# Patient Record
Sex: Male | Born: 1940 | Race: Black or African American | Hispanic: No | Marital: Married | Smoking: Former smoker
Health system: Southern US, Community
[De-identification: ages and names within clinical notes are randomized; demographics above are authoritative.]

## PROBLEM LIST (undated history)

## (undated) DIAGNOSIS — M1990 Unspecified osteoarthritis, unspecified site: Secondary | ICD-10-CM

## (undated) DIAGNOSIS — C801 Malignant (primary) neoplasm, unspecified: Secondary | ICD-10-CM

## (undated) DIAGNOSIS — E785 Hyperlipidemia, unspecified: Secondary | ICD-10-CM

## (undated) DIAGNOSIS — I10 Essential (primary) hypertension: Secondary | ICD-10-CM

## (undated) DIAGNOSIS — I639 Cerebral infarction, unspecified: Secondary | ICD-10-CM

## (undated) DIAGNOSIS — N183 Chronic kidney disease, stage 3 unspecified: Secondary | ICD-10-CM

## (undated) DIAGNOSIS — E119 Type 2 diabetes mellitus without complications: Secondary | ICD-10-CM

## (undated) DIAGNOSIS — K5790 Diverticulosis of intestine, part unspecified, without perforation or abscess without bleeding: Secondary | ICD-10-CM

## (undated) DIAGNOSIS — I429 Cardiomyopathy, unspecified: Secondary | ICD-10-CM

## (undated) DIAGNOSIS — N200 Calculus of kidney: Secondary | ICD-10-CM

## (undated) HISTORY — DX: Unspecified osteoarthritis, unspecified site: M19.90

## (undated) HISTORY — DX: Chronic kidney disease, stage 3 unspecified: N18.30

## (undated) HISTORY — DX: Malignant (primary) neoplasm, unspecified: C80.1

## (undated) HISTORY — DX: Chronic kidney disease, stage 3 (moderate): N18.3

## (undated) HISTORY — DX: Cardiomyopathy, unspecified: I42.9

## (undated) HISTORY — DX: Diverticulosis of intestine, part unspecified, without perforation or abscess without bleeding: K57.90

## (undated) HISTORY — PX: COLONOSCOPY: SHX174

## (undated) HISTORY — DX: Type 2 diabetes mellitus without complications: E11.9

## (undated) HISTORY — DX: Calculus of kidney: N20.0

## (undated) HISTORY — DX: Cerebral infarction, unspecified: I63.9

---

## 1989-07-04 DIAGNOSIS — C801 Malignant (primary) neoplasm, unspecified: Secondary | ICD-10-CM

## 1989-07-04 HISTORY — DX: Malignant (primary) neoplasm, unspecified: C80.1

## 2004-04-16 ENCOUNTER — Ambulatory Visit: Payer: Self-pay | Admitting: Internal Medicine

## 2005-02-04 ENCOUNTER — Ambulatory Visit: Payer: Self-pay | Admitting: Internal Medicine

## 2005-02-07 ENCOUNTER — Ambulatory Visit: Payer: Self-pay | Admitting: Specialist

## 2005-02-09 ENCOUNTER — Ambulatory Visit: Payer: Self-pay | Admitting: Specialist

## 2005-12-16 ENCOUNTER — Ambulatory Visit: Payer: Self-pay | Admitting: Family Medicine

## 2006-07-04 DIAGNOSIS — E119 Type 2 diabetes mellitus without complications: Secondary | ICD-10-CM

## 2006-07-04 DIAGNOSIS — M1990 Unspecified osteoarthritis, unspecified site: Secondary | ICD-10-CM

## 2006-07-04 HISTORY — DX: Type 2 diabetes mellitus without complications: E11.9

## 2006-07-04 HISTORY — DX: Unspecified osteoarthritis, unspecified site: M19.90

## 2006-07-04 HISTORY — PX: COLON SURGERY: SHX602

## 2007-03-12 ENCOUNTER — Ambulatory Visit: Payer: Self-pay | Admitting: Internal Medicine

## 2007-03-22 ENCOUNTER — Ambulatory Visit: Payer: Self-pay | Admitting: General Surgery

## 2008-03-19 ENCOUNTER — Inpatient Hospital Stay: Payer: Self-pay | Admitting: Internal Medicine

## 2011-07-05 HISTORY — PX: FOOT SURGERY: SHX648

## 2012-02-13 ENCOUNTER — Inpatient Hospital Stay: Payer: Self-pay | Admitting: Internal Medicine

## 2012-02-13 LAB — DIFFERENTIAL
Bands: 6 %
Lymphocytes: 7 %
Monocytes: 5 %

## 2012-02-13 LAB — COMPREHENSIVE METABOLIC PANEL
Albumin: 3.1 g/dL — ABNORMAL LOW (ref 3.4–5.0)
Alkaline Phosphatase: 119 U/L (ref 50–136)
Anion Gap: 10 (ref 7–16)
BUN: 18 mg/dL (ref 7–18)
Co2: 25 mmol/L (ref 21–32)
EGFR (African American): 51 — ABNORMAL LOW
Glucose: 231 mg/dL — ABNORMAL HIGH (ref 65–99)
Osmolality: 268 (ref 275–301)
Potassium: 4 mmol/L (ref 3.5–5.1)
SGOT(AST): 63 U/L — ABNORMAL HIGH (ref 15–37)
Sodium: 129 mmol/L — ABNORMAL LOW (ref 136–145)
Total Protein: 7.7 g/dL (ref 6.4–8.2)

## 2012-02-13 LAB — CBC
HCT: 27.6 % — ABNORMAL LOW (ref 40.0–52.0)
HGB: 9.4 g/dL — ABNORMAL LOW (ref 13.0–18.0)
MCH: 31.6 pg (ref 26.0–34.0)
MCHC: 33.9 g/dL (ref 32.0–36.0)
MCV: 93 fL (ref 80–100)

## 2012-02-13 LAB — TROPONIN I
Troponin-I: 0.1 ng/mL — ABNORMAL HIGH
Troponin-I: 0.09 ng/mL — ABNORMAL HIGH

## 2012-02-13 LAB — CK TOTAL AND CKMB (NOT AT ARMC): CK-MB: 1.4 ng/mL (ref 0.5–3.6)

## 2012-02-14 LAB — LIPID PANEL
HDL Cholesterol: 13 mg/dL — ABNORMAL LOW (ref 40–60)
Ldl Cholesterol, Calc: 71 mg/dL (ref 0–100)
Triglycerides: 190 mg/dL (ref 0–200)
VLDL Cholesterol, Calc: 38 mg/dL (ref 5–40)

## 2012-02-14 LAB — CBC WITH DIFFERENTIAL/PLATELET
Basophil #: 0.2 10*3/uL — ABNORMAL HIGH (ref 0.0–0.1)
Basophil %: 0.9 %
Basophil %: 0.3 %
Eosinophil #: 0.2 10*3/uL (ref 0.0–0.7)
Eosinophil #: 0.3 10*3/uL (ref 0.0–0.7)
Eosinophil %: 1.3 %
Eosinophil %: 1.3 %
HCT: 23.3 % — ABNORMAL LOW (ref 40.0–52.0)
HCT: 23.7 % — ABNORMAL LOW (ref 40.0–52.0)
HGB: 7.7 g/dL — ABNORMAL LOW (ref 13.0–18.0)
HGB: 7.9 g/dL — ABNORMAL LOW (ref 13.0–18.0)
Lymphocyte %: 5.9 %
Lymphocyte %: 6 %
MCH: 31.5 pg (ref 26.0–34.0)
MCHC: 32.9 g/dL (ref 32.0–36.0)
Monocyte #: 2 x10 3/mm — ABNORMAL HIGH (ref 0.2–1.0)
Monocyte #: 2.2 x10 3/mm — ABNORMAL HIGH (ref 0.2–1.0)
Monocyte %: 11.3 %
Neutrophil #: 15.9 10*3/uL — ABNORMAL HIGH (ref 1.4–6.5)
Neutrophil %: 81.4 %
Neutrophil %: 81.1 %
Platelet: 207 10*3/uL (ref 150–440)
RBC: 2.49 10*6/uL — ABNORMAL LOW (ref 4.40–5.90)
RBC: 2.52 10*6/uL — ABNORMAL LOW (ref 4.40–5.90)
WBC: 19.3 10*3/uL — ABNORMAL HIGH (ref 3.8–10.6)

## 2012-02-14 LAB — COMPREHENSIVE METABOLIC PANEL
Albumin: 2.5 g/dL — ABNORMAL LOW (ref 3.4–5.0)
Alkaline Phosphatase: 94 U/L (ref 50–136)
BUN: 19 mg/dL — ABNORMAL HIGH (ref 7–18)
Bilirubin,Total: 0.9 mg/dL (ref 0.2–1.0)
Co2: 25 mmol/L (ref 21–32)
Creatinine: 1.79 mg/dL — ABNORMAL HIGH (ref 0.60–1.30)
EGFR (African American): 44 — ABNORMAL LOW
EGFR (Non-African Amer.): 38 — ABNORMAL LOW
Glucose: 57 mg/dL — ABNORMAL LOW (ref 65–99)
Potassium: 3.4 mmol/L — ABNORMAL LOW (ref 3.5–5.1)
SGPT (ALT): 73 U/L (ref 12–78)

## 2012-02-14 LAB — URINALYSIS, COMPLETE
Ketone: NEGATIVE
Ph: 5 (ref 4.5–8.0)
Protein: 100
RBC,UR: 256 /HPF (ref 0–5)
Transitional Epi: <1

## 2012-02-14 LAB — HEMOGLOBIN A1C: Hemoglobin A1C: 6.8 % — ABNORMAL HIGH (ref 4.2–6.3)

## 2012-02-14 LAB — CK TOTAL AND CKMB (NOT AT ARMC): CK, Total: 94 U/L (ref 35–232)

## 2012-02-14 LAB — MAGNESIUM: Magnesium: 1.7 mg/dL — ABNORMAL LOW

## 2012-02-15 LAB — CBC WITH DIFFERENTIAL/PLATELET
Basophil #: 0.1 10*3/uL (ref 0.0–0.1)
Basophil %: 0.8 %
Eosinophil #: 0.3 10*3/uL (ref 0.0–0.7)
HCT: 22.9 % — ABNORMAL LOW (ref 40.0–52.0)
HGB: 7.7 g/dL — ABNORMAL LOW (ref 13.0–18.0)
Lymphocyte #: 1.7 10*3/uL (ref 1.0–3.6)
Lymphocyte %: 10.2 %
MCH: 31.8 pg (ref 26.0–34.0)
MCHC: 33.7 g/dL (ref 32.0–36.0)
MCV: 94 fL (ref 80–100)
Monocyte #: 2 x10 3/mm — ABNORMAL HIGH (ref 0.2–1.0)
Neutrophil #: 12.2 10*3/uL — ABNORMAL HIGH (ref 1.4–6.5)
Neutrophil %: 74.9 %
RDW: 13.8 % (ref 11.5–14.5)

## 2012-02-15 LAB — BASIC METABOLIC PANEL
BUN: 26 mg/dL — ABNORMAL HIGH (ref 7–18)
Calcium, Total: 7.9 mg/dL — ABNORMAL LOW (ref 8.5–10.1)
EGFR (African American): 44 — ABNORMAL LOW
EGFR (Non-African Amer.): 38 — ABNORMAL LOW
Glucose: 59 mg/dL — ABNORMAL LOW (ref 65–99)
Potassium: 4.4 mmol/L (ref 3.5–5.1)
Sodium: 134 mmol/L — ABNORMAL LOW (ref 136–145)

## 2012-02-16 LAB — URINE CULTURE

## 2012-02-16 LAB — HEPATIC FUNCTION PANEL A (ARMC)
Albumin: 2.5 g/dL — ABNORMAL LOW (ref 3.4–5.0)
Alkaline Phosphatase: 104 U/L (ref 50–136)
Bilirubin,Total: 0.9 mg/dL (ref 0.2–1.0)
SGOT(AST): 37 U/L (ref 15–37)

## 2012-02-16 LAB — HEMOGLOBIN: HGB: 8.8 g/dL — ABNORMAL LOW (ref 13.0–18.0)

## 2012-02-16 LAB — BASIC METABOLIC PANEL
Anion Gap: 9 (ref 7–16)
Calcium, Total: 8.4 mg/dL — ABNORMAL LOW (ref 8.5–10.1)
Chloride: 97 mmol/L — ABNORMAL LOW (ref 98–107)
Glucose: 207 mg/dL — ABNORMAL HIGH (ref 65–99)
Osmolality: 273 (ref 275–301)

## 2012-02-17 LAB — CBC WITH DIFFERENTIAL/PLATELET
Basophil #: 0.1 10*3/uL (ref 0.0–0.1)
Basophil %: 0.5 %
Eosinophil #: 0.3 10*3/uL (ref 0.0–0.7)
HCT: 23.3 % — ABNORMAL LOW (ref 40.0–52.0)
HGB: 7.4 g/dL — ABNORMAL LOW (ref 13.0–18.0)
Lymphocyte %: 9.6 %
MCHC: 31.8 g/dL — ABNORMAL LOW (ref 32.0–36.0)
Monocyte %: 7.4 %
Neutrophil #: 13.8 10*3/uL — ABNORMAL HIGH (ref 1.4–6.5)
Neutrophil %: 80.5 %
Platelet: 263 10*3/uL (ref 150–440)
RBC: 2.48 10*6/uL — ABNORMAL LOW (ref 4.40–5.90)
WBC: 17.2 10*3/uL — ABNORMAL HIGH (ref 3.8–10.6)

## 2012-02-17 LAB — BASIC METABOLIC PANEL
Anion Gap: 8 (ref 7–16)
BUN: 23 mg/dL — ABNORMAL HIGH (ref 7–18)
Chloride: 97 mmol/L — ABNORMAL LOW (ref 98–107)
Creatinine: 1.72 mg/dL — ABNORMAL HIGH (ref 0.60–1.30)
EGFR (African American): 46 — ABNORMAL LOW
EGFR (Non-African Amer.): 39 — ABNORMAL LOW
Glucose: 182 mg/dL — ABNORMAL HIGH (ref 65–99)
Osmolality: 269 (ref 275–301)

## 2012-02-18 LAB — CBC WITH DIFFERENTIAL/PLATELET
Basophil #: 0.1 10*3/uL (ref 0.0–0.1)
Eosinophil #: 0.3 10*3/uL (ref 0.0–0.7)
Lymphocyte %: 10.6 %
MCHC: 32.3 g/dL (ref 32.0–36.0)
Neutrophil %: 79.8 %
Platelet: 285 10*3/uL (ref 150–440)
RDW: 13.9 % (ref 11.5–14.5)

## 2012-02-18 LAB — COMPREHENSIVE METABOLIC PANEL
Albumin: 2.4 g/dL — ABNORMAL LOW (ref 3.4–5.0)
Alkaline Phosphatase: 96 U/L (ref 50–136)
Calcium, Total: 8.3 mg/dL — ABNORMAL LOW (ref 8.5–10.1)
Chloride: 97 mmol/L — ABNORMAL LOW (ref 98–107)
Co2: 26 mmol/L (ref 21–32)
Glucose: 149 mg/dL — ABNORMAL HIGH (ref 65–99)
SGOT(AST): 22 U/L (ref 15–37)
SGPT (ALT): 40 U/L (ref 12–78)

## 2012-02-18 LAB — FERRITIN: Ferritin (ARMC): 644 ng/mL — ABNORMAL HIGH (ref 8–388)

## 2012-02-18 LAB — WOUND CULTURE

## 2012-02-18 LAB — IRON AND TIBC
Iron Saturation: 20 %
Iron: 36 ug/dL — ABNORMAL LOW (ref 65–175)
Unbound Iron-Bind.Cap.: 146 ug/dL

## 2012-02-18 LAB — FOLATE: Folic Acid: 18.5 ng/mL (ref 3.1–100.0)

## 2012-02-19 LAB — CBC WITH DIFFERENTIAL/PLATELET
Bands: 4 %
Lymphocytes: 15 %
MCV: 95 fL (ref 80–100)
Metamyelocyte: 2 %
Myelocyte: 1 %
NRBC/100 WBC: 1 /
Platelet: 283 10*3/uL (ref 150–440)
RBC: 2.74 10*6/uL — ABNORMAL LOW (ref 4.40–5.90)
RDW: 14.2 % (ref 11.5–14.5)
WBC: 13.7 10*3/uL — ABNORMAL HIGH (ref 3.8–10.6)

## 2012-02-19 LAB — CULTURE, BLOOD (SINGLE)

## 2012-02-19 LAB — BASIC METABOLIC PANEL
Anion Gap: 8 (ref 7–16)
BUN: 25 mg/dL — ABNORMAL HIGH (ref 7–18)
Calcium, Total: 8.4 mg/dL — ABNORMAL LOW (ref 8.5–10.1)
Chloride: 101 mmol/L (ref 98–107)
Co2: 27 mmol/L (ref 21–32)
Creatinine: 1.6 mg/dL — ABNORMAL HIGH (ref 0.60–1.30)
EGFR (African American): 50 — ABNORMAL LOW
EGFR (Non-African Amer.): 43 — ABNORMAL LOW
Glucose: 239 mg/dL — ABNORMAL HIGH (ref 65–99)
Sodium: 136 mmol/L (ref 136–145)

## 2012-02-20 LAB — CBC WITH DIFFERENTIAL/PLATELET
Bands: 3 %
Basophil #: 0.1 10*3/uL (ref 0.0–0.1)
Eosinophil #: 0.3 10*3/uL (ref 0.0–0.7)
Eosinophil: 3 %
HCT: 27.6 % — ABNORMAL LOW (ref 40.0–52.0)
HGB: 8.8 g/dL — ABNORMAL LOW (ref 13.0–18.0)
Lymphocyte %: 12.2 %
Lymphocytes: 9 %
MCV: 94 fL (ref 80–100)
Monocyte %: 8.5 %
Myelocyte: 2 %
Neutrophil %: 76 %
Platelet: 316 10*3/uL (ref 150–440)
RBC: 2.93 10*6/uL — ABNORMAL LOW (ref 4.40–5.90)
RDW: 14.4 % (ref 11.5–14.5)
Segmented Neutrophils: 74 %
Variant Lymphocyte - H1-Rlymph: 3 %
WBC: 13.3 10*3/uL — ABNORMAL HIGH (ref 3.8–10.6)

## 2012-02-20 LAB — BASIC METABOLIC PANEL
Anion Gap: 8 (ref 7–16)
BUN: 25 mg/dL — ABNORMAL HIGH (ref 7–18)
Co2: 31 mmol/L (ref 21–32)
Creatinine: 1.46 mg/dL — ABNORMAL HIGH (ref 0.60–1.30)
EGFR (African American): 56 — ABNORMAL LOW
EGFR (Non-African Amer.): 48 — ABNORMAL LOW
Glucose: 204 mg/dL — ABNORMAL HIGH (ref 65–99)
Potassium: 4 mmol/L (ref 3.5–5.1)
Sodium: 139 mmol/L (ref 136–145)

## 2012-09-26 ENCOUNTER — Encounter: Payer: Self-pay | Admitting: *Deleted

## 2012-10-01 ENCOUNTER — Ambulatory Visit (INDEPENDENT_AMBULATORY_CARE_PROVIDER_SITE_OTHER): Payer: Medicare Other | Admitting: General Surgery

## 2012-10-01 ENCOUNTER — Encounter: Payer: Self-pay | Admitting: General Surgery

## 2012-10-01 VITALS — BP 130/62 | HR 100 | Resp 18 | Ht 74.0 in | Wt 271.0 lb

## 2012-10-01 DIAGNOSIS — Z85038 Personal history of other malignant neoplasm of large intestine: Secondary | ICD-10-CM

## 2012-10-01 MED ORDER — POLYETHYLENE GLYCOL 3350 17 GM/SCOOP PO POWD: ORAL | Status: DC

## 2012-10-01 NOTE — Progress Notes (Signed)
Patient ID: Jacob Buck, male   DOB: 01/09/1941, 72 y.o.   MRN: [id number]  No chief complaint on file.   HPI Jacob Buck is a 72 y.o. male that presents for a follow up colonoscopy.Patient had a colonoscopy in 2008. 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 HPI  Past Medical History  Diagnosis Date  . Endocrine problem 2008  . Arthritis 2008  . Diabetes mellitus without complication 2008  . Cancer   . Bowel trouble 2008    Past Surgical History  Procedure Laterality Date  . Colonoscopy  2003  . Colon surgery  2008  . Foot surgery      No family history on file.  Social History History  Substance Use Topics  . Smoking status: Never Smoker   . Smokeless tobacco: Not on file  . Alcohol Use: No    Allergies not on file  No current outpatient prescriptions on file.   No current facility-administered medications for this visit.    Review of Systems Review of Systems  Constitutional: Negative.   Respiratory: Negative.   Cardiovascular: Negative.   Gastrointestinal: Negative.     There were no vitals taken for this visit.  Physical Exam Physical Exam  Constitutional: He appears well-developed and well-nourished.  Eyes: Conjunctivae are normal. No scleral icterus.  Neck: Normal range of motion. Neck supple. No mass and no thyromegaly present.  Cardiovascular: Normal rate, regular rhythm and normal heart sounds.   Pulmonary/Chest: Effort normal and breath sounds normal.  Abdominal: Soft. Bowel sounds are normal. There is no hepatosplenomegaly. There is no tenderness.    Lymphadenopathy:    He has no cervical adenopathy.    He has no axillary adenopathy.       Right: No inguinal and no supraclavicular adenopathy present.       Left: No inguinal and no supraclavicular adenopathy present.    Data Reviewed    Assessment    History of colon cancer      Plan    Surveillance colonoscopy        Jacob Buck 10/01/2012, 1:15 PM

## 2012-10-01 NOTE — Patient Instructions (Addendum)
Colonoscopy with possible biopsy/polypectomy prn: Information regarding the procedure, including its potential risks and complications (including but not limited to perforation of the bowel, which may require emergency surgery to repair, and bleeding) was verbally given to the patient. Educational information regarding lower instestinal endoscopy was given to the patient. Written instructions for how to complete the bowel prep using Miralax were provided. The importance of drinking ample fluids to avoid dehydration as a result of the prep emphasized.  Patient has been scheduled for a colonoscopy on 10-10-12 at [facility]. This patient has been asked to not make use of Levemir day of colonoscopy prep or the morning of colonoscopy. Please note: patient reports he has not been making use of Novolog since blood sugars have been running so good for the past few weeks. He is aware to not make use of Novolog the day of prep and procedure as well.

## 2012-10-02 ENCOUNTER — Encounter: Payer: Self-pay | Admitting: General Surgery

## 2012-10-03 ENCOUNTER — Ambulatory Visit: Payer: Self-pay | Admitting: General Surgery

## 2012-10-05 ENCOUNTER — Other Ambulatory Visit: Payer: Self-pay | Admitting: General Surgery

## 2012-10-05 DIAGNOSIS — Z85038 Personal history of other malignant neoplasm of large intestine: Secondary | ICD-10-CM

## 2012-10-10 ENCOUNTER — Ambulatory Visit: Payer: Self-pay | Admitting: General Surgery

## 2012-10-10 DIAGNOSIS — Z85038 Personal history of other malignant neoplasm of large intestine: Secondary | ICD-10-CM

## 2013-09-18 ENCOUNTER — Ambulatory Visit (INDEPENDENT_AMBULATORY_CARE_PROVIDER_SITE_OTHER): Payer: Medicare Other | Admitting: General Surgery

## 2013-09-18 ENCOUNTER — Other Ambulatory Visit: Payer: Self-pay | Admitting: General Surgery

## 2013-09-18 ENCOUNTER — Encounter: Payer: Self-pay | Admitting: General Surgery

## 2013-09-18 VITALS — BP 122/84 | HR 86 | Resp 12 | Ht 74.5 in | Wt 255.0 lb

## 2013-09-18 DIAGNOSIS — K625 Hemorrhage of anus and rectum: Secondary | ICD-10-CM

## 2013-09-18 DIAGNOSIS — Z85038 Personal history of other malignant neoplasm of large intestine: Secondary | ICD-10-CM

## 2013-09-18 LAB — CBC WITH DIFFERENTIAL/PLATELET
BASOS ABS: 0.1 10*3/uL (ref 0.0–0.1)
Basophil %: 0.8 %
EOS PCT: 1.1 %
Eosinophil #: 0.1 10*3/uL (ref 0.0–0.7)
HCT: 37.4 % — ABNORMAL LOW (ref 40.0–52.0)
HGB: 12 g/dL — ABNORMAL LOW (ref 13.0–18.0)
Lymphocyte #: 1.9 10*3/uL (ref 1.0–3.6)
Lymphocyte %: 21.1 %
MCH: 30 pg (ref 26.0–34.0)
MCHC: 32.1 g/dL (ref 32.0–36.0)
MCV: 94 fL (ref 80–100)
Monocyte #: 0.5 x10 3/mm (ref 0.2–1.0)
Monocyte %: 5.5 %
Neutrophil #: 6.4 10*3/uL (ref 1.4–6.5)
Neutrophil %: 71.5 %
Platelet: 167 10*3/uL (ref 150–440)
RBC: 4 10*6/uL — ABNORMAL LOW (ref 4.40–5.90)
RDW: 13.5 % (ref 11.5–14.5)
WBC: 8.9 10*3/uL (ref 3.8–10.6)

## 2013-09-18 LAB — BASIC METABOLIC PANEL
Anion Gap: 3 — ABNORMAL LOW (ref 7–16)
BUN: 24 mg/dL — ABNORMAL HIGH (ref 7–18)
CHLORIDE: 99 mmol/L (ref 98–107)
CO2: 29 mmol/L (ref 21–32)
Calcium, Total: 8.9 mg/dL (ref 8.5–10.1)
Creatinine: 1.67 mg/dL — ABNORMAL HIGH (ref 0.60–1.30)
GFR CALC AF AMER: 47 — AB
GFR CALC NON AF AMER: 40 — AB
Glucose: 393 mg/dL — ABNORMAL HIGH (ref 65–99)
Osmolality: 283 (ref 275–301)
Potassium: 4.6 mmol/L (ref 3.5–5.1)
Sodium: 131 mmol/L — ABNORMAL LOW (ref 136–145)

## 2013-09-18 LAB — POC HEMOCCULT BLD/STL (OFFICE/1-CARD/DIAGNOSTIC): FECAL OCCULT BLD: POSITIVE

## 2013-09-18 NOTE — Addendum Note (Signed)
Addended by: Carson Myrtle on: 09/18/2013 10:38 AM   Modules accepted: Orders

## 2013-09-18 NOTE — Patient Instructions (Addendum)
The patient is aware to call back for any questions or concerns.Low-Fiber Diet Fiber is found in fruits, vegetables, and grains. A low-fiber diet restricts fibrous foods that are not digested in the small intestine. A diet containing about 10 grams of fiber is considered low fiber.  PURPOSE  To prevent blockage of a partially obstructed or narrowed gastrointestinal tract.  To reduce fecal weight and volume.  To slow the movement of feces. WHEN IS THIS DIET USED?  It may be used during the acute phase of Crohn disease, ulcerative colitis, regional enteritis, or diverticulitis.  It may be used if your intestinal or esophageal tubes are narrowing (stenosis).  It may be used as a transitional diet following surgery, injury (trauma), or illness. CHOOSING FOODS Check labels, especially on foods from the starch list. Often times, dietary fiber content is listed on the nutrition facts panel. Please ask your Registered Dietitian if you have questions about specific foods that are related to your condition, especially if the food is not listed on this handout. Breads and Starches  Allowed: White, [place], and pita breads, plain rolls, buns, or sweet rolls, doughnuts, waffles, pancakes, bagels. Plain muffins, biscuits, matzoth. Soda, saltine, graham crackers. Pretzels, rusks, melba toast, zwieback. Cooked cereals: cornmeal, farina, or cream cereals. Dry cereals: refined corn, wheat, rice, and oat cereals (check label). Potatoes prepared any way without skins, refined macaroni, spaghetti, noodles, refined rice.  Avoid: Whole-wheat bread, rolls, and crackers. Multigrains, rye, bran seeds, nuts, or coconut. Cereals containing whole grains, multigrains, bran, coconut, nuts, raisins. Cooked or dry oatmeal. Coarse wheat cereals, granola. Cereals advertised as "high fiber." Potato skins. Whole-grain pasta, wild or brown rice. Popcorn. Vegetables  Allowed: Strained tomato and vegetable juices. Fresh lettuce,  cucumber, spinach. Well-cooked or canned: asparagus, bean sprouts, broccoli, cut green beans, cauliflower, pumpkin, beets, mushrooms, yellow squash, tomato, tomato sauce, zucchini, turnips.Keep servings limited to  cup.  Avoid: Fresh, cooked, or canned: artichokes, baked beans, beet greens, Brussels sprouts, corn, kale, legumes, peas, sweet potatoes. Avoid large servings of any vegetables. Fruit  Allowed: All fruit juices except prune juice. Cooked or canned fruits without skin and seeds: apricots, applesauce, cantaloupe, cherries, grapefruit, grapes, kiwi, mandarin oranges, peaches, pears, fruit cocktail, pineapple, plums, watermelon. Fresh without skin: banana, grapes, cantaloupe, avocado, cherries, pineapple, kiwi, nectarines, peaches, blueberries. Keep servings limited to  cup or 1 piece.  Avoid: Fresh: apples with or without skin, apricots, mangoes, pears, raspberries, strawberries. Prune juice and juices with pulp, stewed or dried prunes. Dried fruits, raisins, dates. Avoid large servings of all fresh fruits. Meat and Protein Substitutes  Allowed: Ground or well-cooked tender beef, ham, veal, lamb, pork, poultry. Eggs, plain cheese. Fish, oysters, shrimp, lobster, other seafood. Liver, organ meats. Smooth nut butters.  Avoid: Tough, fibrous meats with gristle. Chunky nut butter.Cheese with seeds, nuts, or other foods not allowed. Nuts, seeds, legumes, dried peas, beans, lentils. Dairy  Allowed: All milk products except those not allowed.  Avoid: Yogurt or cheese that contains nuts, seeds, or added fruit. Soups and Combination Foods  Allowed: Bouillon, broth, or cream soups made from allowed foods. Any strained soup. Casseroles or mixed dishes made with allowed foods.  Avoid: Soups made from vegetables that are not allowed or that contain other foods not allowed. Desserts and Sweets  Allowed:Plain cakes and cookies, pie made with allowed fruit, pudding, custard, cream pie.  Gelatin, fruit, ice, sherbet, frozen ice pops. Ice cream, ice milk without nuts. Plain hard candy, honey, jelly, molasses, syrup, sugar, chocolate  syrup, gumdrops, marshmallows.  Avoid: Desserts, cookies, or candies that contain nuts, peanut butter, dried fruits. Jams, preserves with seeds, marmalade. Fats and Oils  Allowed:Margarine, butter, cream, mayonnaise, salad oils, plain salad dressings made from allowed foods.  Avoid: Seeds, nuts, olives. Beverages  Allowed: All, except those listed to avoid.  Avoid: Fruit juices with high pulp, prune juice. Condiments  Allowed:Ketchup, mustard, horseradish, vinegar, cream sauce, cheese sauce, cocoa powder. Spices in moderation: allspice, basil, bay leaves, celery powder or leaves, cinnamon, cumin powder, curry powder, ginger, mace, marjoram, onion or garlic powder, oregano, paprika, parsley flakes, ground pepper, rosemary, sage, savory, tarragon, thyme, turmeric.  Avoid: Coconut, pickles. SAMPLE MENU Breakfast   cup orange juice.  1 boiled egg.  1 slice white toast.  Margarine.   cup cornflakes.  1 cup milk.  Beverage. Lunch   cup chicken noodle soup.  2 to 3 oz sliced roast beef.  2 slices white bread.  Mayonnaise.   cup tomato juice.  1 small banana.  Beverage. Dinner  3 oz baked chicken.   cup scalloped potatoes.   cup cooked beets.  White dinner roll.  Margarine.   cup canned peaches.  Beverage. Document Released: 12/10/2001 Document Revised: 02/20/2013 Document Reviewed: 07/07/2011 [facility] Patient Information 2014 [place].

## 2013-09-18 NOTE — Progress Notes (Signed)
Patient ID: Jacob Buck, male   DOB: 02-11-1941, 73 y.o.   MRN: [id number]  Chief Complaint  Patient presents with  . Follow-up    blood in stool    HPI Jacob Buck is a 73 y.o. male here today for rectal bleeding. Patient state he is been having bright red blood in his stools since this Sunday with BM.  Bowels move every other day. Colonoscopy 10-2012. Denies abdominal pain. States he has been eating a lot of peanuts lately. This happen back in the 1970"s and the doctor said a "vessel in stomach had busted".  HPI  Past Medical History  Diagnosis Date  . Endocrine problem 2008  . Arthritis 2008  . Diabetes mellitus without complication 0932  . Bowel trouble 2008  . Diverticulosis   . 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 Colonoscopy  2003, 2014  . Foot surgery Right 2013  . Colon surgery  2008    carcinoid    History reviewed. No pertinent family history.  Social History History  Substance Use Topics  . Smoking status: Never Smoker   . Smokeless tobacco: Never Used  . Alcohol Use: No    No Known Allergies  Current Outpatient Prescriptions  Medication Sig Dispense Refill  . carvedilol (COREG) 12.5 MG tablet Take 1 tablet by mouth 2 (two) times daily with a meal.       . Furosemide (LASIX PO) Take by mouth as needed.      Marland Kitchen LEVEMIR FLEXPEN 100 UNIT/ML injection Inject into the skin 2 (two) times daily. 30am 25 pm      . lisinopril (PRINIVIL,ZESTRIL) 40 MG tablet       . NOVOFINE 32G X 6 MM MISC       . NOVOLOG FLEXPEN 100 UNIT/ML injection       . tamsulosin (FLOMAX) 0.4 MG CAPS Take 0.4 mg by mouth daily.       No current facility-administered medications for this visit.    Review of Systems Review of Systems  Constitutional: Negative.   Respiratory: Negative.   Cardiovascular: Negative.   Gastrointestinal: Positive for blood in stool. Negative for nausea, abdominal pain, diarrhea and constipation.    Blood  pressure 122/84, pulse 86, resp. rate 12, height 6' 2.5" (1.892 m), weight 255 lb (115.667 kg).  Physical Exam Physical Exam  Constitutional: He is oriented to person, place, and time. He appears well-developed and well-nourished.  Abdominal: Soft. There is no hepatosplenomegaly. There is no tenderness. No hernia.  Genitourinary: Rectal exam shows no external hemorrhoid and no fissure. Guaiac positive stool.  Digital exam showed blood stained stool, no palpable mass  Neurological: He is alert and oriented to person, place, and time.  Skin: Skin is warm and dry.    Data Reviewed Colonoscopy from last yr-no anorectal finding. Has sigmoid diverticulosis  Assessment    Probable diverticular bleed.     Plan    Check CBC, Met b. Low residue diet. Pt has no hemodynamic changes. Will avoid admission to hospital for now.        Ferrell Flam G 09/18/2013, 10:05 AM

## 2013-09-19 ENCOUNTER — Encounter: Payer: Self-pay | Admitting: General Surgery

## 2013-09-24 ENCOUNTER — Encounter: Payer: Self-pay | Admitting: General Surgery

## 2013-09-25 ENCOUNTER — Encounter: Payer: Self-pay | Admitting: General Surgery

## 2013-09-25 NOTE — Progress Notes (Signed)
Patient did have labs drawn at [facility] (unable to have completed at [facility] lab). I have called Manus Gunning at [facility] lab and she will be faxing results. Appointment scheduled for follow up in the office on 10-02-13.

## 2013-10-02 ENCOUNTER — Encounter: Payer: Self-pay | Admitting: General Surgery

## 2013-10-02 ENCOUNTER — Ambulatory Visit (INDEPENDENT_AMBULATORY_CARE_PROVIDER_SITE_OTHER): Payer: Medicare Other | Admitting: General Surgery

## 2013-10-02 VITALS — BP 132/78 | HR 90 | Resp 16 | Ht 74.0 in | Wt 253.0 lb

## 2013-10-02 DIAGNOSIS — K625 Hemorrhage of anus and rectum: Secondary | ICD-10-CM

## 2013-10-02 NOTE — Progress Notes (Signed)
Patient ID: Jacob Buck, male   DOB: March 21, 1941, 73 y.o.   MRN: [id number]  Chief Complaint  Patient presents with  . Follow-up    rectal bleeding    HPI Jacob Buck is a 73 y.o. male here today for follow up rectal bleeding. Patient state he has not been having bright red blood in his stools with BM. Bowels move every day and sometimes twice a day. Using metamucil and has been doing good.  Colonoscopy 10-2012. Denies abdominal pain.  Labs were done 09-18-13 that showed his blood glucose was elevated and creatine was 1.6.  HPI  Past Medical History  Diagnosis Date  . Endocrine problem 2008  . Arthritis 2008  . Diabetes mellitus without complication 5643  . Bowel trouble 2008  . Diverticulosis   . 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 Colonoscopy  2003, 2014  . Foot surgery Right 2013  . Colon surgery  2008    carcinoid    History reviewed. No pertinent family history.  Social History History  Substance Use Topics  . Smoking status: Never Smoker   . Smokeless tobacco: Never Used  . Alcohol Use: No    No Known Allergies  Current Outpatient Prescriptions  Medication Sig Dispense Refill  . carvedilol (COREG) 12.5 MG tablet Take 1 tablet by mouth 2 (two) times daily with a meal.       . Furosemide (LASIX PO) Take by mouth as needed.      Marland Kitchen LEVEMIR FLEXPEN 100 UNIT/ML injection Inject into the skin 2 (two) times daily. 30am 25 pm      . lisinopril (PRINIVIL,ZESTRIL) 40 MG tablet       . NOVOFINE 32G X 6 MM MISC       . NOVOLOG FLEXPEN 100 UNIT/ML injection       . tamsulosin (FLOMAX) 0.4 MG CAPS Take 0.4 mg by mouth daily.       No current facility-administered medications for this visit.    Review of Systems Review of Systems  Constitutional: Negative.   Respiratory: Negative.   Cardiovascular: Negative.   Gastrointestinal: Negative for abdominal pain, constipation and blood in stool.    Blood pressure 132/78,  pulse 90, resp. rate 16, height 6\' 2"  (1.88 m), weight 253 lb (114.76 kg).  Physical Exam Physical Exam  Constitutional: He is oriented to person, place, and time. He appears well-developed and well-nourished.  Eyes: Conjunctivae are normal.  Neck: Neck supple.  Abdominal: Soft. Bowel sounds are normal. There is no tenderness.  Lymphadenopathy:    He has no cervical adenopathy.  Neurological: He is alert and oriented to person, place, and time.  Skin: Skin is warm and dry.    Data Reviewed  Labs were done 09-18-13 that showed his blood glucose was elevated at 398 and creatine was 1.6.   Assessment    Diverticular bleed likely that has resolved.      Plan    Call for any reoccurrence of problems.       Jacob Buck G 10/02/2013, 1:00 PM

## 2013-10-02 NOTE — Patient Instructions (Signed)
Patient advise don the importance of blood sugar control. He is not using insulin, unable to afford, advised to discuss at his up coming visit with Dr Jeananne Rama.

## 2014-02-18 LAB — LIPID PANEL
CHOLESTEROL: 260 mg/dL — AB (ref 0–200)
HDL: 27 mg/dL — AB (ref 35–70)
TRIGLYCERIDES: 999 mg/dL — AB (ref 40–160)

## 2014-02-18 LAB — BASIC METABOLIC PANEL
BUN: 37 mg/dL — AB (ref 4–21)
Creatinine: 1.5 mg/dL — AB (ref 0.6–1.3)
Glucose: 367 mg/dL
POTASSIUM: 4.6 mmol/L (ref 3.4–5.3)
SODIUM: 133 mmol/L — AB (ref 137–147)

## 2014-02-18 LAB — HEMOGLOBIN A1C: Hgb A1c MFr Bld: 10.8 % — AB (ref 4.0–6.0)

## 2014-05-22 LAB — BASIC METABOLIC PANEL
BUN: 18 mg/dL (ref 4–21)
Creatinine: 1.4 mg/dL — AB (ref 0.6–1.3)
Glucose: 333 mg/dL
Potassium: 5.1 mmol/L (ref 3.4–5.3)
Sodium: 138 mmol/L (ref 137–147)

## 2014-05-22 LAB — HEMOGLOBIN A1C: HEMOGLOBIN A1C: 11.8 % — AB (ref 4.0–6.0)

## 2014-05-28 ENCOUNTER — Ambulatory Visit: Payer: Self-pay | Admitting: Endocrinology

## 2014-06-05 ENCOUNTER — Encounter (INDEPENDENT_AMBULATORY_CARE_PROVIDER_SITE_OTHER): Payer: Self-pay

## 2014-06-05 ENCOUNTER — Encounter: Payer: Self-pay | Admitting: Endocrinology

## 2014-06-05 ENCOUNTER — Ambulatory Visit (INDEPENDENT_AMBULATORY_CARE_PROVIDER_SITE_OTHER): Payer: Medicare Other | Admitting: Endocrinology

## 2014-06-05 VITALS — BP 158/98 | HR 91 | Wt 252.0 lb

## 2014-06-05 DIAGNOSIS — Z89421 Acquired absence of other right toe(s): Secondary | ICD-10-CM | POA: Insufficient documentation

## 2014-06-05 DIAGNOSIS — E113599 Type 2 diabetes mellitus with proliferative diabetic retinopathy without macular edema, unspecified eye: Secondary | ICD-10-CM

## 2014-06-05 DIAGNOSIS — E118 Type 2 diabetes mellitus with unspecified complications: Secondary | ICD-10-CM | POA: Insufficient documentation

## 2014-06-05 DIAGNOSIS — E1165 Type 2 diabetes mellitus with hyperglycemia: Secondary | ICD-10-CM

## 2014-06-05 DIAGNOSIS — E11319 Type 2 diabetes mellitus with unspecified diabetic retinopathy without macular edema: Secondary | ICD-10-CM | POA: Insufficient documentation

## 2014-06-05 DIAGNOSIS — I10 Essential (primary) hypertension: Secondary | ICD-10-CM

## 2014-06-05 DIAGNOSIS — IMO0002 Reserved for concepts with insufficient information to code with codable children: Secondary | ICD-10-CM

## 2014-06-05 DIAGNOSIS — N183 Chronic kidney disease, stage 3 unspecified: Secondary | ICD-10-CM

## 2014-06-05 DIAGNOSIS — E1129 Type 2 diabetes mellitus with other diabetic kidney complication: Secondary | ICD-10-CM

## 2014-06-05 DIAGNOSIS — Z85038 Personal history of other malignant neoplasm of large intestine: Secondary | ICD-10-CM | POA: Insufficient documentation

## 2014-06-05 DIAGNOSIS — E11359 Type 2 diabetes mellitus with proliferative diabetic retinopathy without macular edema: Secondary | ICD-10-CM

## 2014-06-05 DIAGNOSIS — E782 Mixed hyperlipidemia: Secondary | ICD-10-CM

## 2014-06-05 DIAGNOSIS — E1140 Type 2 diabetes mellitus with diabetic neuropathy, unspecified: Secondary | ICD-10-CM | POA: Insufficient documentation

## 2014-06-05 DIAGNOSIS — E1142 Type 2 diabetes mellitus with diabetic polyneuropathy: Secondary | ICD-10-CM

## 2014-06-05 DIAGNOSIS — E1122 Type 2 diabetes mellitus with diabetic chronic kidney disease: Secondary | ICD-10-CM

## 2014-06-05 LAB — HEPATIC FUNCTION PANEL
ALBUMIN: 4 g/dL (ref 3.5–5.2)
ALT: 18 U/L (ref 0–53)
AST: 20 U/L (ref 0–37)
Alkaline Phosphatase: 54 U/L (ref 39–117)
BILIRUBIN DIRECT: 0.1 mg/dL (ref 0.0–0.3)
TOTAL PROTEIN: 7.1 g/dL (ref 6.0–8.3)
Total Bilirubin: 1 mg/dL (ref 0.2–1.2)

## 2014-06-05 LAB — LIPID PANEL
Cholesterol: 245 mg/dL — ABNORMAL HIGH (ref 0–200)
HDL: 27.8 mg/dL — ABNORMAL LOW (ref >39.00)
NonHDL: 217.2
Total CHOL/HDL Ratio: 9
VLDL: 106.2 mg/dL — ABNORMAL HIGH (ref 0.0–40.0)

## 2014-06-05 LAB — HM DIABETES FOOT EXAM: HM DIABETIC FOOT EXAM: ABNORMAL

## 2014-06-05 MED ORDER — INSULIN PEN NEEDLE 32G X 4 MM MISC: Status: DC

## 2014-06-05 MED ORDER — INSULIN ASPART 100 UNIT/ML FLEXPEN: 20.0000 [IU] | PEN_INJECTOR | Freq: Two times a day (BID) | SUBCUTANEOUS | Status: DC

## 2014-06-05 MED ORDER — INSULIN DETEMIR 100 UNIT/ML FLEXPEN: 40.0000 [IU] | PEN_INJECTOR | Freq: Every day | SUBCUTANEOUS | Status: DC

## 2014-06-05 NOTE — Assessment & Plan Note (Signed)
Encouraged him to follow back with opthalm, since it has been quite long since he last saw them.

## 2014-06-05 NOTE — Assessment & Plan Note (Addendum)
Obtain last set of labs done with PCP. Update urine MA at later visits.  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

## 2014-06-05 NOTE — Assessment & Plan Note (Addendum)
Discussed at length regarding need for better control. Obtain last set of labs done with PCP.   Discussed dietary changes and given handout as well to control carbohydrates and portion sizes.  Encouraged him to check sugars 3 x daily. Discussed various treatment options given his limited income. He is reluctant to try premixed insulin in vial form due to limited vision.   He wishes to go back to Levemir. Asked him to start at 40 units once daily. Stop Toujeo.   He will start Novolog again ( 30 day free coupon given) at 20 units twice daily with brunch and supper.   They will drop off his sugars in about 10 days, for further adjustments. They want to keep down the visit copays.   RTC 6 weeks

## 2014-06-05 NOTE — Assessment & Plan Note (Signed)
Symptoms tolerable. Discussed foot care- daily. If he is agreeable then will consider podiatry visits in the future for foot care.

## 2014-06-05 NOTE — Progress Notes (Signed)
Reason for visit-  Jacob Buck is a 73 y.o.-year-old male, referred by his PCP,  Golden Pop, MD for management of Type 2 diabetes, uncontrolled, with complications ( Stage 3 CKD, proliferative retinopathy,neuropathy). Associated diagnosis of cardiomyopathy, though the patient denies history of it. Here with daughter.   HPI- Patient has been diagnosed with diabetes in 84s. Recalls being initially on lifestyle modifications.  Tried  Metformin in the past, but states that he was taken off as it was ineffective.  he has been on insulin since ~2010.   Pt is currently on a regimen of:  - Levemir 30 units am and 25 units pm --> last Thursday switched to Toujeo 30 units daily--> not taking since 2 days - Novolog 25 units qac- BID (BF, supper)- hasn't taken it in over a year due to finances  Took Toujeo till Tuesday -had an episode of where he started mumbling, sweating, was "out of it", doesn't remember anything else from that episode, lasted 45 minutes, no facial asymmetry, FS 266, BP 103/64 at that time, no apparent chest pain. Dr Jeananne Rama called by family then and he asked to cut back HCTZ to half dose. Then after 45 min alertness returned. Didn't go to ER because he has hx of refusal to go to ER per his daughter. The patient and family believe that this episode was due to his new medications- Toujeo and perhaps HCTZ. He has been off most of his medications since past 2 days.   Last hemoglobin A1c was: Lab Results  Component Value Date   HGBA1C 10.8* 02/18/2014   Reports that recent labs were drawn with PCP, 05/2014- I don't have those for review.   Pt checks his sugars 1-2 a day . Uses ? Mail order glucometer ( getting new one today or tomorrow). By recall they are:  PREMEAL Breakfast Lunch Dinner Bedtime Overall  Glucose range: 140-260   200s   Mean/median:        POST-MEAL PC Breakfast PC Lunch PC Dinner  Glucose range:     Mean/median:       Hypoglycemia-  No lows  recently.  Previous hx of taking "too much insulin" per daughter.   Dietary habits- eats twice times daily- Brunch and supper. Southern style of cooking at home with bacon grease and such.Daughter reports that the patient eats often and doesn't control his portion sizes . Sprite about 1 weekly. Exercise-  Doesn't exercise due to neuropathy. Walks with cane, especially again after recent episode, where he is still feeling some weakness.  Weight -  Wt Readings from Last 3 Encounters:  06/05/14 252 lb (114.306 kg)  10/02/13 253 lb (114.76 kg)  09/18/13 255 lb (115.667 kg)    Diabetes Complications-  Nephropathy- Yes, Stage 3  CKD, last BUN/creatinine- GFR 54 Lab Results  Component Value Date   BUN 37* 02/18/2014   CREATININE 1.5* 02/18/2014   No recent urine MA on file.   Retinopathy- Yes, Last DEE was in 2009. Didn't go again because of finances . Has limited vision.  Neuropathy- has numbness and tingling in his feet. Known neuropathy. Prior hx toe amputation ( right, 5th) and DM ulcer ~2012 Associated history - No CAD . No prior stroke. Chart mentions CMP. No hypothyroidism. his last TSH was No results found for: TSH  Hyperlipidemia-  his last set of lipids were- Currently on dietary therapy. Tolerating well.  Recalls being on simvastatin and atorvastatin in the past. Unclear why that was taken off by  PCP. Doesn't recall any side effects from these medication.  Lab Results  Component Value Date   CHOL 260* 02/18/2014   HDL 27* 02/18/2014   TRIG 999* 02/18/2014    Blood Pressure/HTN- Patient's blood pressure is not well controlled today on current regimen that includes ACE-I ( Lisinopril).He has been off this medication and the HCTZ since earlier this week as above. Was previously tolerating ACE-I okay. Continues to take coreg. Denies any systemic symptoms today.   Pt has FH of DM in siblings.  I have reviewed the patient's past medical history, family and social history, surgical  history, medications and allergies.  Past Medical History  Diagnosis Date  . Endocrine problem 2008  . Arthritis 2008  . Diabetes mellitus without complication 2440  . Bowel trouble 2008  . Diverticulosis   . Cancer 1991    colon,kidney   Past Surgical History  Procedure Laterality Date  . Colonoscopy  2003, 2014  . Foot surgery Right 2013  . Colon surgery  2008    carcinoid   Family History  Problem Relation Age of Onset  . Diabetes Sister   . Diabetes Brother    History   Social History  . Marital Status: Married    Spouse Name: N/A    Number of Children: N/A  . Years of Education: N/A   Occupational History  . Not on file.   Social History Main Topics  . Smoking status: Never Smoker   . Smokeless tobacco: Never Used  . Alcohol Use: No  . Drug Use: No  . Sexual Activity: Not on file   Other Topics Concern  . Not on file   Social History Narrative   Current Outpatient Prescriptions on File Prior to Visit  Medication Sig Dispense Refill  . carvedilol (COREG) 12.5 MG tablet Take 1 tablet by mouth 2 (two) times daily with a meal.     . lisinopril (PRINIVIL,ZESTRIL) 40 MG tablet      No current facility-administered medications on file prior to visit.   No Known Allergies   Review of Systems: [x]  complains of  [  ] denies General:   [ x ] Recent weight change [  ] Fatigue  [  ] Loss of appetite Eyes: [ x ]  Vision Difficulty [  ]  Eye pain ENT: [  ]  Hearing difficulty [  ]  Difficulty Swallowing CVS: [  ] Chest pain [  ]  Palpitations/Irregular Heart beat [  ]  Shortness of breath lying flat [ x ] Swelling of legs Resp: [  ] Frequent Cough [  ] Shortness of Breath  [  ]  Wheezing GI: [  ] Heartburn  [  ] Nausea or Vomiting  [  ] Diarrhea [ x ] Constipation  [  ] Abdominal Pain GU: [  ]  Polyuria  [ x ]  nocturia Bones/joints:  [ x ]  Muscle aches  [x  ] Joint Pain  [  ] Bone pain Skin/Hair/Nails: [  ]  Rash  [  ] New stretch marks [ x ]  Itching [  ]  Hair loss [  ]  Excessive hair growth Reproduction: [x  ] Low sexual desire , [  ]  Women: Menstrual cycle problems [  ]  Women: Breast Discharge [ x ] Men: Difficulty with erections [  ]  Men: Enlarged Breasts CNS: [  ] Frequent Headaches [x  ] Blurry vision [  ] Tremors [  ]  Seizures [  ] Loss of consciousness [  ] Localized weakness Endocrine: [  ]  Excess thirst [  ]  Feeling excessively hot [  ]  Feeling excessively cold Heme: [  ]  Easy bruising [  ]  Enlarged glands or lumps in neck Allergy: [  ]  Food allergies [  ] Environmental allergies  PE: BP 158/98 mmHg  Pulse 91  Wt 252 lb (114.306 kg)  SpO2 97% Wt Readings from Last 3 Encounters:  06/05/14 252 lb (114.306 kg)  10/02/13 253 lb (114.76 kg)  09/18/13 255 lb (115.667 kg)   GENERAL: No acute distress, well developed HEENT:  Eye exam shows normal external appearance. Oral exam shows normal mucosa .  NECK:   Neck exam shows no lymphadenopathy. No Carotids bruits. Thyroid is not enlarged and no nodules felt.  no acanthosis nigricans LUNGS:         Chest is symmetrical. Lungs are clear to auscultation.Marland Kitchen   HEART:         Heart sounds:  S1 and S2 are normal. No murmurs or clicks heard. ABDOMEN:  No Distention present. Liver and spleen are not palpable. No other mass or tenderness present.  EXTREMITIES:     There is no edema. 1+ DP pulses  NEUROLOGICAL:     Grossly intact.            Diabetic foot exam done with shoes and socks removed: Loss of Normal Monofilament testing bilaterally, right >left ( patchy decrease). No deformities of toes.  Nails  dystrophic. Skin normal color. No open wounds. Hyperkeratotic lesion right medial sole. Old healed ulcer right great toe base. Dry skin. 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  MUSCULOSKELETAL:       There is no enlargement or gross deformity of the joints.  SKIN:       No rash  ASSESSMENT AND PLAN: Problem List Items Addressed This Visit      Cardiovascular and Mediastinum   Essential hypertension,  benign    BP elevated today. Denies systemic symptoms. Asked him to restart Lisinopril 40 mg daily for now , in addition to continuing Coreg. He can continue to hold off on HCTZ.  He will try to check his BP at local pharmacies.   Repeat BMP in 10 days to assess K, GFR.      Relevant Medications      hydrochlorothiazide (HYDRODIURIL) 12.5 MG tablet     Endocrine   Type 2 diabetes, uncontrolled, with renal manifestation - Primary    Discussed at length regarding need for better control. Obtain last set of labs done with PCP.   Discussed dietary changes and given handout as well to control carbohydrates and portion sizes.  Encouraged him to check sugars 3 x daily. Discussed various treatment options given his limited income. He is reluctant to try premixed insulin in vial form due to limited vision.   He wishes to go back to Levemir. Asked him to start at 40 units once daily. Stop Toujeo.   He will start Novolog again ( 30 day free coupon given) at 20 units twice daily with brunch and supper.   They will drop off his sugars in about 10 days, for further adjustments. They want to keep down the visit copays.   RTC 6 weeks     Relevant Medications      Insulin Detemir (LEVEMIR FLEXPEN) 100 UNIT/ML Pen      insulin aspart (NOVOLOG FLEXPEN) 100 UNIT/ML FlexPen  Insulin Pen Needle (BD PEN NEEDLE NANO U/F) 32G X 4 MM MISC   Other Relevant Orders      Hepatic function panel      Lipid panel      Basic metabolic panel     Nervous and Auditory   Diabetic neuropathy    Symptoms tolerable. Discussed foot care- daily. If he is agreeable then will consider podiatry visits in the future for foot care.     Relevant Medications      Insulin Detemir (LEVEMIR FLEXPEN) 100 UNIT/ML Pen      insulin aspart (NOVOLOG FLEXPEN) 100 UNIT/ML FlexPen     Genitourinary   CKD stage 3 due to type 2 diabetes mellitus    Obtain last set of labs done with PCP. Update urine MA at later visits.  Last labs  were c/w Stage 3 CKD.    Relevant Medications      Insulin Detemir (LEVEMIR FLEXPEN) 100 UNIT/ML Pen      insulin aspart (NOVOLOG FLEXPEN) 100 UNIT/ML FlexPen     Other   Diabetic retinopathy    Encouraged him to follow back with opthalm, since it has been quite long since he last saw them.     Relevant Medications      Insulin Detemir (LEVEMIR FLEXPEN) 100 UNIT/ML Pen      insulin aspart (NOVOLOG FLEXPEN) 100 UNIT/ML FlexPen   Status post amputation of lesser toe of right foot    Due to prior injury. Healed well.     Hypercholesterolemia with hypertriglyceridemia    Will check with PCP regarding whether the patient had prior intolerances to the statins. He is currently not on any therapy, and the TGs levels were very elevated on last check. Discussed dietary therapy. May need to restart statin therapy.   Obtain labs today for lipid, liver tests.     Relevant Medications      hydrochlorothiazide (HYDRODIURIL) 12.5 MG tablet          - Return to clinic in  6 weeks with sugar log/meter.  Gared Gillie [facility] 06/05/2014 4:46 PM

## 2014-06-05 NOTE — Assessment & Plan Note (Signed)
Due to prior injury. Healed well.

## 2014-06-05 NOTE — Assessment & Plan Note (Signed)
BP elevated today. Denies systemic symptoms. Asked him to restart Lisinopril 40 mg daily for now , in addition to continuing Coreg. He can continue to hold off on HCTZ.  He will try to check his BP at local pharmacies.   Repeat BMP in 10 days to assess K, GFR.

## 2014-06-05 NOTE — Progress Notes (Signed)
Pre visit review using our clinic review tool, if applicable. No additional management support is needed unless otherwise documented below in the visit note. 

## 2014-06-05 NOTE — Patient Instructions (Addendum)
Check sugars 3 times daily ( fasting and premeal readings at alternating times, or at bedtime).  Record them in a sugar log and bring log and meter to next appointment.   Stop Toujeo.  Start levemir at 40 units daily at bedtime.  Start Novolog at 20 units with breakfast and 20 units with supper.    Start lisinopril again at 40 mg daily. Check BP at local pharmacies.    Will check with Dr Jeananne Rama regarding prior reactions to cholesterol medications.   Please come back for a follow-up appointment in 6 weeks.  Drop off sugars in 10 days for further adjustments. Labs then to check K levels after starting lisinopril.

## 2014-06-05 NOTE — Assessment & Plan Note (Signed)
Will check with PCP regarding whether the patient had prior intolerances to the statins. He is currently not on any therapy, and the TGs levels were very elevated on last check. Discussed dietary therapy. May need to restart statin therapy.   Obtain labs today for lipid, liver tests.

## 2014-06-07 LAB — LDL CHOLESTEROL, DIRECT: LDL DIRECT: 108 mg/dL

## 2014-06-09 ENCOUNTER — Other Ambulatory Visit: Payer: Self-pay | Admitting: Endocrinology

## 2014-06-09 DIAGNOSIS — E782 Mixed hyperlipidemia: Secondary | ICD-10-CM

## 2014-06-09 MED ORDER — ATORVASTATIN CALCIUM 20 MG PO TABS: 20.0000 mg | ORAL_TABLET | Freq: Every day | ORAL | Status: DC

## 2014-06-16 ENCOUNTER — Other Ambulatory Visit (INDEPENDENT_AMBULATORY_CARE_PROVIDER_SITE_OTHER): Payer: Medicare Other

## 2014-06-16 DIAGNOSIS — E1165 Type 2 diabetes mellitus with hyperglycemia: Secondary | ICD-10-CM

## 2014-06-16 DIAGNOSIS — E1129 Type 2 diabetes mellitus with other diabetic kidney complication: Secondary | ICD-10-CM

## 2014-06-16 DIAGNOSIS — IMO0002 Reserved for concepts with insufficient information to code with codable children: Secondary | ICD-10-CM

## 2014-06-16 LAB — BASIC METABOLIC PANEL
BUN: 29 mg/dL — ABNORMAL HIGH (ref 6–23)
CHLORIDE: 103 meq/L (ref 96–112)
CO2: 26 mEq/L (ref 19–32)
Calcium: 9.2 mg/dL (ref 8.4–10.5)
Creatinine, Ser: 1.4 mg/dL (ref 0.4–1.5)
GFR: 66.56 mL/min (ref >60.00)
Glucose, Bld: 235 mg/dL — ABNORMAL HIGH (ref 70–99)
Potassium: 4.6 mEq/L (ref 3.5–5.1)
Sodium: 137 mEq/L (ref 135–145)

## 2014-06-17 ENCOUNTER — Telehealth: Payer: Self-pay

## 2014-06-17 ENCOUNTER — Encounter: Payer: Self-pay | Admitting: Endocrinology

## 2014-06-17 NOTE — Telephone Encounter (Signed)
His sugars are still elevated at most times on current insulin regimen.   Please could you ask him to increase Novolog to 22 units with BF and supper. ( currently at 20 units twice daily).   In another 3 days, if sugars are still >180, then increase levemir to 42 units daily at bedtime ( instead of the 40 units that he is taking right now).    Thanks- please see that he has an appt in [place] as well for follow up.

## 2014-06-17 NOTE — Telephone Encounter (Signed)
Blood Sugar Readings:                                  Breakfast            Dinner            Bedtime  12/3           263                      264                  214  12/4           192                      261                  214  12/5           163                     242                   177  12/6           141                     189                   201  12/7           198                      204  12/8           189       12/9            225                    205                  225  12/10         241                     236  12/11         230                    169  12/12         237                     192  12/13         235                      205

## 2014-06-17 NOTE — Telephone Encounter (Signed)
Spoke to patient, wife and daughter to notify them of Dr. Boyd Kerbs comments. All parties verbalized understanding.

## 2014-07-22 ENCOUNTER — Encounter: Payer: Self-pay | Admitting: Endocrinology

## 2014-07-22 ENCOUNTER — Other Ambulatory Visit: Payer: Self-pay | Admitting: Endocrinology

## 2014-07-22 ENCOUNTER — Ambulatory Visit (INDEPENDENT_AMBULATORY_CARE_PROVIDER_SITE_OTHER): Payer: Medicare Other | Admitting: Endocrinology

## 2014-07-22 VITALS — BP 150/86 | HR 95 | Resp 14 | Ht 74.5 in | Wt 253.0 lb

## 2014-07-22 DIAGNOSIS — I10 Essential (primary) hypertension: Secondary | ICD-10-CM

## 2014-07-22 DIAGNOSIS — E1165 Type 2 diabetes mellitus with hyperglycemia: Principal | ICD-10-CM

## 2014-07-22 DIAGNOSIS — E1129 Type 2 diabetes mellitus with other diabetic kidney complication: Secondary | ICD-10-CM

## 2014-07-22 DIAGNOSIS — N183 Chronic kidney disease, stage 3 (moderate): Secondary | ICD-10-CM

## 2014-07-22 DIAGNOSIS — E1122 Type 2 diabetes mellitus with diabetic chronic kidney disease: Secondary | ICD-10-CM

## 2014-07-22 DIAGNOSIS — IMO0002 Reserved for concepts with insufficient information to code with codable children: Secondary | ICD-10-CM

## 2014-07-22 DIAGNOSIS — E782 Mixed hyperlipidemia: Secondary | ICD-10-CM

## 2014-07-22 LAB — COMPREHENSIVE METABOLIC PANEL
ALT: 18 U/L (ref 0–53)
AST: 18 U/L (ref 0–37)
Albumin: 4.1 g/dL (ref 3.5–5.2)
Alkaline Phosphatase: 60 U/L (ref 39–117)
BILIRUBIN TOTAL: 0.6 mg/dL (ref 0.2–1.2)
BUN: 42 mg/dL — ABNORMAL HIGH (ref 6–23)
CHLORIDE: 100 meq/L (ref 96–112)
CO2: 27 mEq/L (ref 19–32)
CREATININE: 1.7 mg/dL — AB (ref 0.40–1.50)
Calcium: 9.3 mg/dL (ref 8.4–10.5)
GFR: 51 mL/min — AB (ref >60.00)
Glucose, Bld: 293 mg/dL — ABNORMAL HIGH (ref 70–99)
POTASSIUM: 4.7 meq/L (ref 3.5–5.1)
SODIUM: 135 meq/L (ref 135–145)
Total Protein: 7.1 g/dL (ref 6.0–8.3)

## 2014-07-22 LAB — LIPID PANEL
Cholesterol: 155 mg/dL (ref 0–200)
HDL: 31.5 mg/dL — ABNORMAL LOW (ref >39.00)
TRIGLYCERIDES: 538 mg/dL — AB (ref 0.0–149.0)
Total CHOL/HDL Ratio: 5

## 2014-07-22 LAB — MICROALBUMIN / CREATININE URINE RATIO
Creatinine,U: 184.3 mg/dL
MICROALB/CREAT RATIO: 78.7 mg/g — AB (ref 0.0–30.0)
Microalb, Ur: 145 mg/dL — ABNORMAL HIGH (ref 0.0–1.9)

## 2014-07-22 LAB — LDL CHOLESTEROL, DIRECT: Direct LDL: 41 mg/dL

## 2014-07-22 NOTE — Assessment & Plan Note (Signed)
Was not able to find out why the patient was taken off statin earlier. Restarted statin at last visit. Tolerating well.  Update fasting labs today to assess response.  Continue with dietary therapy.

## 2014-07-22 NOTE — Assessment & Plan Note (Signed)
Last labs were c/w Stage 3 CKD and stable.

## 2014-07-22 NOTE — Progress Notes (Signed)
Pre visit review using our clinic review tool, if applicable. No additional management support is needed unless otherwise documented below in the visit note. 

## 2014-07-22 NOTE — Assessment & Plan Note (Signed)
BP elevated today. Denies systemic symptoms. Asked him not to hold his BP meds prior to coming. Recheck BP at local pharmacies and at home periodically. Continue Lisinopril and Coreg for now.  He can continue to hold off on HCTZ. Whether to start this can be assessed at next visits with PCP/me.  Update urine MA today.

## 2014-07-22 NOTE — Assessment & Plan Note (Signed)
Recent A1c very elevated. Goal is 7.5-8%.  Most sugars elevated recently by recall.   Encouraged him to check sugars 4 x daily. Wants to continue on current therapy.  Change levemir to 45 units daily. Continue Novolog at 22 units twice daily with brunch and supper.   He will send me his sugars in about 1 week for further adjustments. Asked him not to wait after taking Novolog but eat right away.    RTC 6 weeks

## 2014-07-22 NOTE — Progress Notes (Signed)
Reason for visit-  Jacob Buck is a 74 y.o.-year-old male, referred by his PCP,  Golden Pop, MD for management of Type 2 diabetes, uncontrolled, with complications ( Stage 3 CKD, proliferative retinopathy,neuropathy). Associated diagnosis of cardiomyopathy, though the patient denies history of it. Here with daughter.   HPI- Patient has been diagnosed with diabetes in 95s. Recalls being initially on lifestyle modifications.  Tried  Metformin in the past, but states that he was taken off as it was ineffective.  he has been on insulin since ~2010.   Pt is currently on a regimen of:  - Levemir 42 units pm  - Novolog 22 units qac- BID (BF, supper)  Taking it consistently since last time and tolerating well.   Last hemoglobin A1c was: Lab Results  Component Value Date   HGBA1C 11.8* 05/22/2014   HGBA1C 10.8* 02/18/2014    Pt checks his sugars 4 a day . Uses  Mail order glucometer. By recall they are:  PREMEAL Breakfast Lunch Dinner Bedtime Overall  Glucose range: 240s   190-200s   Mean/median:        POST-MEAL PC Breakfast PC Lunch PC Dinner  Glucose range:     Mean/median:       Hypoglycemia-  One low recently except one instance of 62 post Novolog ( didn't eat after taking Novolog) .  Previous hx of taking "too much insulin" per daughter.   Dietary habits- eats twice times daily- Brunch and supper. Southern style of cooking at home with bacon grease and such.Daughter reports that the patient eats often and doesn't control his portion sizes . Sprite about 1 weekly. This has changed over the past several weeks- now measuring rice and limiting to 1/2 cup and also not drinking sprite recently. Exercise-  Doesn't exercise due to neuropathy. Walks with cane,, where he is still feeling some weakness- which is getting better.  Weight -  Wt Readings from Last 3 Encounters:  07/22/14 253 lb (114.76 kg)  06/05/14 252 lb (114.306 kg)  10/02/13 253 lb (114.76 kg)    Diabetes  Complications-  Nephropathy- Yes, Stage 3  CKD, last BUN/creatinine- GFR 54 Lab Results  Component Value Date   BUN 29* 06/16/2014   CREATININE 1.4 06/16/2014   Lab Results  Component Value Date   GFR 66.56 06/16/2014   No results found for: MICRALBCREAT    Retinopathy- Yes, Last DEE was in 2009. Didn't go again because of finances . Has limited vision.  Neuropathy- has numbness and tingling in his feet. Known neuropathy. Prior hx toe amputation ( right, 5th) and DM ulcer ~2012 Associated history - No CAD . No prior stroke. Chart mentions CMP. No hypothyroidism. his last TSH was No results found for: TSH  Hyperlipidemia-  his last set of lipids were- Currently on atorvastatin 20 mg daily. Tolerating well.  Recalls being on simvastatin and atorvastatin in the past. Unclear why that was taken off by PCP. Doesn't recall any side effects from these medication. Statin was started again by me last visit.  Lab Results  Component Value Date   CHOL 245* 06/05/2014   HDL 27.80* 06/05/2014   LDLDIRECT 108.0 06/05/2014   TRIG * 06/05/2014    531.0 Triglyceride is over 400; calculations on Lipids are invalid.   CHOLHDL 9 06/05/2014    Blood Pressure/HTN- Patient's blood pressure is not well controlled today on current regimen that includes ACE-I ( Lisinopril).Restarted ACE-I at last visit- tolerating well. Recently bought a home BP machine, but  hasnt checked on it recently. Today - he didn't take his BP pills prior to arrival and BP is elevated. was previously on HCTZ.     I have reviewed the patient's past medical history,medications and allergies.   Current Outpatient Prescriptions on File Prior to Visit  Medication Sig Dispense Refill  . atorvastatin (LIPITOR) 20 MG tablet Take 1 tablet (20 mg total) by mouth at bedtime. 30 tablet 3  . carvedilol (COREG) 12.5 MG tablet Take 1 tablet by mouth 2 (two) times daily with a meal.     . hydrochlorothiazide (HYDRODIURIL) 12.5 MG tablet Take 12.5  mg by mouth daily.    . insulin aspart (NOVOLOG FLEXPEN) 100 UNIT/ML FlexPen Inject 20 Units into the skin 2 (two) times daily before a meal. (Patient taking differently: Inject 22 Units into the skin 2 (two) times daily before a meal. ) 15 mL 4  . Insulin Detemir (LEVEMIR FLEXPEN) 100 UNIT/ML Pen Inject 40 Units into the skin daily at 10 pm. (Patient taking differently: Inject 42 Units into the skin daily at 10 pm. ) 15 mL 4  . Insulin Pen Needle (BD PEN NEEDLE NANO U/F) 32G X 4 MM MISC Use for insulin administration three times daily 100 each 11  . lisinopril (PRINIVIL,ZESTRIL) 40 MG tablet      No current facility-administered medications on file prior to visit.   No Known Allergies   Review of Systems- [ x ]  Complains of    [  ]  denies [  ] Recent weight change [  ]  Fatigue [  ] polydipsia [  ] polyuria [  ]  nocturia [  ]  vision difficulty [  ] chest pain [  ] shortness of breath [  ] leg swelling [  ] cough [  ] nausea/vomiting [  ] diarrhea [  ] constipation [  ] abdominal pain [ x ]  tingling/numbness in extremities [  ]  concern with feet ( wounds/sores)   PE: BP 150/86 mmHg  Pulse 95  Resp 14  Ht 6' 2.5" (1.892 m)  Wt 253 lb (114.76 kg)  BMI 32.06 kg/m2  SpO2 97% Wt Readings from Last 3 Encounters:  07/22/14 253 lb (114.76 kg)  06/05/14 252 lb (114.306 kg)  10/02/13 253 lb (114.76 kg)   Exam: deferred  ASSESSMENT AND PLAN: Problem List Items Addressed This Visit      Cardiovascular and Mediastinum   Essential hypertension, benign    BP elevated today. Denies systemic symptoms. Asked him not to hold his BP meds prior to coming. Recheck BP at local pharmacies and at home periodically. Continue Lisinopril and Coreg for now.  He can continue to hold off on HCTZ. Whether to start this can be assessed at next visits with PCP/me.  Update urine MA today.           Relevant Orders   Comprehensive metabolic panel   Microalbumin / creatinine urine ratio   Lipid  panel     Endocrine   Type 2 diabetes, uncontrolled, with renal manifestation - Primary    Recent A1c very elevated. Goal is 7.5-8%.  Most sugars elevated recently by recall.   Encouraged him to check sugars 4 x daily. Wants to continue on current therapy.  Change levemir to 45 units daily. Continue Novolog at 22 units twice daily with brunch and supper.   He will send me his sugars in about 1 week for further adjustments. Asked him not to  wait after taking Novolog but eat right away.    RTC 6 weeks         Relevant Orders   Comprehensive metabolic panel   Microalbumin / creatinine urine ratio   Lipid panel     Genitourinary   CKD stage 3 due to type 2 diabetes mellitus      Last labs were c/w Stage 3 CKD and stable.        Relevant Orders   Comprehensive metabolic panel   Microalbumin / creatinine urine ratio   Lipid panel     Other   Hypercholesterolemia with hypertriglyceridemia    Was not able to find out why the patient was taken off statin earlier. Restarted statin at last visit. Tolerating well.  Update fasting labs today to assess response.  Continue with dietary therapy.           Relevant Orders   Comprehensive metabolic panel   Microalbumin / creatinine urine ratio   Lipid panel        Patient reports that he is not going to Dr Jeananne Rama anymore and requesting new PCP- will set up with Lorane Gell.   - Return to clinic in  6 weeks with sugar log/meter.  Ebunoluwa Gernert [facility] 07/22/2014 8:49 AM

## 2014-07-22 NOTE — Patient Instructions (Signed)
Check sugars 4 x daily ( before each meal and at bedtime).  Record them in a log book and bring that/meter to next appointment.   Change levemir 45 units daily. Continue Novolog at 22 units twice daily ( take right before a meal).  Send me sugars in the mail in about 1 week.   Labs today ( fasting).   Please come back for a follow-up appointment in 6 weeks

## 2014-07-29 ENCOUNTER — Other Ambulatory Visit (INDEPENDENT_AMBULATORY_CARE_PROVIDER_SITE_OTHER): Payer: Medicare Other

## 2014-07-29 DIAGNOSIS — E1165 Type 2 diabetes mellitus with hyperglycemia: Secondary | ICD-10-CM

## 2014-07-29 DIAGNOSIS — E1129 Type 2 diabetes mellitus with other diabetic kidney complication: Secondary | ICD-10-CM

## 2014-07-29 DIAGNOSIS — IMO0002 Reserved for concepts with insufficient information to code with codable children: Secondary | ICD-10-CM

## 2014-07-30 LAB — BASIC METABOLIC PANEL
BUN: 26 mg/dL — AB (ref 6–23)
CO2: 26 meq/L (ref 19–32)
CREATININE: 1.61 mg/dL — AB (ref 0.40–1.50)
Calcium: 9.3 mg/dL (ref 8.4–10.5)
Chloride: 102 mEq/L (ref 96–112)
GFR: 54.3 mL/min — ABNORMAL LOW (ref >60.00)
Glucose, Bld: 120 mg/dL — ABNORMAL HIGH (ref 70–99)
POTASSIUM: 4.4 meq/L (ref 3.5–5.1)
Sodium: 136 mEq/L (ref 135–145)

## 2014-08-01 ENCOUNTER — Other Ambulatory Visit: Payer: Medicare Other

## 2014-08-05 ENCOUNTER — Telehealth: Payer: Self-pay

## 2014-08-05 ENCOUNTER — Other Ambulatory Visit: Payer: Self-pay | Admitting: Endocrinology

## 2014-08-05 DIAGNOSIS — E1122 Type 2 diabetes mellitus with diabetic chronic kidney disease: Secondary | ICD-10-CM

## 2014-08-05 DIAGNOSIS — N183 Chronic kidney disease, stage 3 (moderate): Principal | ICD-10-CM

## 2014-08-05 NOTE — Telephone Encounter (Signed)
Referral placed for nephrology.

## 2014-08-05 NOTE — Progress Notes (Signed)
Patient ID: Jacob Buck, male   DOB: 12/09/1940, 74 y.o.   MRN: [id number]

## 2014-08-05 NOTE — Telephone Encounter (Signed)
Spoke to patient to go over [employer]. Patient stated that he would like to go to [employer]. He has increased novolog insulin to 24units as of yesterday and has been keeping a log of his sugar readings. Patient stated he would send a mychart message of his log in 1 week.

## 2014-08-20 ENCOUNTER — Telehealth: Payer: Self-pay

## 2014-08-20 NOTE — Telephone Encounter (Signed)
Jacob Buck please review my message and advise if you will be able to product this letter or would you like Dr. Howell Rucks to handle this matter.

## 2014-08-20 NOTE — Telephone Encounter (Signed)
Letter was written and signed by Dr. Howell Rucks. Left message on patient's home phone to notify letter ready for pick-up. Left callback number for patient to let me know if he would like letter delivered via mail.

## 2014-08-20 NOTE — Telephone Encounter (Signed)
Could we check with Morey Hummingbird to see if she can write such a  Letter, otherwise should be okay to send it through me. thanks

## 2014-08-20 NOTE — Telephone Encounter (Signed)
Patient daughter called stating that she needs a letter written for social service. She stated that she gets food stamps for her parents and herself and it is time to renew her application. She needs a letter stating that she is the primary care giver for her father. 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 Patient's daughter brings him to all appointments and manages all of his medications. Patient has appt scheduled with Jacob Gell, NP in march. Told Jacob Buck that she may have to get letter from former PCP. Jacob Buck stated that it is very difficult to reach the right person at that office. Please advise.

## 2014-08-20 NOTE — Telephone Encounter (Signed)
It may look bad to SS if I write a letter when I have not seen him yet. I spoke with Dr. Howell Rucks and she will handle this until he is established with me. Thanks!

## 2014-08-25 ENCOUNTER — Ambulatory Visit: Payer: Self-pay | Admitting: Nephrology

## 2014-08-26 ENCOUNTER — Telehealth: Payer: Self-pay

## 2014-08-26 LAB — HEMOGLOBIN A1C: Hgb A1c MFr Bld: 8.4 % — AB (ref 4.0–6.0)

## 2014-08-26 LAB — BASIC METABOLIC PANEL
BUN: 28 mg/dL — AB (ref 4–21)
Creatinine: 1.6 mg/dL — AB (ref 0.6–1.3)
Glucose: 76 mg/dL
POTASSIUM: 4.4 mmol/L (ref 3.4–5.3)
Sodium: 140 mmol/L (ref 137–147)

## 2014-08-26 LAB — CBC AND DIFFERENTIAL
HCT: 39 % — AB (ref 41–53)
Hemoglobin: 13 g/dL — AB (ref 13.5–17.5)
Neutrophils Absolute: 6 /uL
PLATELETS: 224 10*3/uL (ref 150–399)
WBC: 10.1 10^3/mL

## 2014-08-26 NOTE — Telephone Encounter (Signed)
Patient is refusing to go to the E.D. Have not received the CT scan from Dr. Assunta Gambles office yet. Told daughter to call EMT for transportation.

## 2014-08-26 NOTE — Telephone Encounter (Signed)
Patient's daughter called to report patient's blood pressure 118/81 and blood sugar was 245.

## 2014-08-26 NOTE — Telephone Encounter (Signed)
Recd a call from [place], pt daughter- just prior to Tonika's documentation.  He was seen in nephrology clinic yday for CKD. He has also been having spells for the past 2-3 days- intermittently- symptoms during this time include slurred speech, making noise, tries to get up, closes his eyes, looks at his tongue, then after some time doesn't recall anything. Spells are similar to what he experienced few months ago when he was with Dr Jeananne Rama.  He reports that he feels that his sugar is low even when it is below 200. No actual low documented recently. Daughter suspects that his symptoms are related to lowish sugar. This am he has had BF and daughter called back with FS at 245.  Asked him to check BP and it was also normal.  Patient was having a spell with new onset right sided weakness at nephro clinic yday- he had a CT head-we dont have the results- called nephro clinic for results. Reviewed nephrology clinic note.   In the meantime, case was also discussed with Morey Hummingbird. Would recommend that he goes to the ER at this time, for further evaluation asap via ambulance.

## 2014-08-26 NOTE — Telephone Encounter (Signed)
Per Dr. Howell Rucks patient needs to be seen in the E.D. Patient's daughter notified. She stated that she will try to convince him to go. Advice daughter to call the office if patient refuse to go to E.D.

## 2014-08-27 NOTE — Telephone Encounter (Signed)
Spoke to [place]. Patient is now feeling better without any residual symptoms.  We finally were able to trace down the Ct scan done Feb 22nd, 2016 and there is no evidence of stroke.  Patient had refused to go to the ER yesterday and daughter has been monitoring him all day today.  Last night sugar was 325 and he took extra levemir and Novolog. This morning FS 117 and he again adjusted his insulins.  I have asked him not to adjust his Levemir as it is a longer acting agent.  Ideally we are trying to get him on a steady dose of insulin, so that we dont have to keep adjusting.  Daughter is aware to call nephrology and call for the CT result.  She is aware to take patient to ER if similar spell again.  In the past, patient has refused to go with EMT to the hospital for similar spells, and I explained that these spells will need further evaluation as they are of unclear etiology to me.

## 2014-08-29 ENCOUNTER — Telehealth: Payer: Self-pay | Admitting: Endocrinology

## 2014-08-29 NOTE — Telephone Encounter (Signed)
Patient Daughter is calling for the results of her dad CT Scan.

## 2014-08-29 NOTE — Telephone Encounter (Signed)
Please review message below and advise. They never got the results from kidney specialist. They only forwarded the results to [place], never told the patient the result.

## 2014-09-01 NOTE — Telephone Encounter (Signed)
Spoke to patient's daughter Rodena Piety to inform her of Dr. Boyd Kerbs comments. She verbalized understanding and stated that she picked up the report from the kidney specialist today.

## 2014-09-01 NOTE — Telephone Encounter (Signed)
Please let her know that there was no evidence of acute stroke. I dont remember the other details as being significant, but I sent the copy to be scanned and dont have the printout in front of me.

## 2014-09-17 ENCOUNTER — Ambulatory Visit: Payer: Medicare Other | Admitting: Nurse Practitioner

## 2014-09-24 ENCOUNTER — Ambulatory Visit (INDEPENDENT_AMBULATORY_CARE_PROVIDER_SITE_OTHER): Payer: Medicare Other | Admitting: Nurse Practitioner

## 2014-09-24 ENCOUNTER — Encounter: Payer: Self-pay | Admitting: Nurse Practitioner

## 2014-09-24 VITALS — BP 144/70 | HR 93 | Temp 98.4°F | Resp 14 | Ht 74.5 in | Wt 249.5 lb

## 2014-09-24 DIAGNOSIS — Z7189 Other specified counseling: Secondary | ICD-10-CM | POA: Diagnosis not present

## 2014-09-24 DIAGNOSIS — Z89421 Acquired absence of other right toe(s): Secondary | ICD-10-CM

## 2014-09-24 DIAGNOSIS — E1122 Type 2 diabetes mellitus with diabetic chronic kidney disease: Secondary | ICD-10-CM | POA: Diagnosis not present

## 2014-09-24 DIAGNOSIS — Z7689 Persons encountering health services in other specified circumstances: Secondary | ICD-10-CM

## 2014-09-24 DIAGNOSIS — IMO0002 Reserved for concepts with insufficient information to code with codable children: Secondary | ICD-10-CM

## 2014-09-24 DIAGNOSIS — E1165 Type 2 diabetes mellitus with hyperglycemia: Secondary | ICD-10-CM

## 2014-09-24 DIAGNOSIS — E1129 Type 2 diabetes mellitus with other diabetic kidney complication: Secondary | ICD-10-CM

## 2014-09-24 DIAGNOSIS — E1142 Type 2 diabetes mellitus with diabetic polyneuropathy: Secondary | ICD-10-CM

## 2014-09-24 DIAGNOSIS — I10 Essential (primary) hypertension: Secondary | ICD-10-CM

## 2014-09-24 DIAGNOSIS — E11359 Type 2 diabetes mellitus with proliferative diabetic retinopathy without macular edema: Secondary | ICD-10-CM

## 2014-09-24 DIAGNOSIS — N183 Chronic kidney disease, stage 3 (moderate): Secondary | ICD-10-CM

## 2014-09-24 DIAGNOSIS — E113599 Type 2 diabetes mellitus with proliferative diabetic retinopathy without macular edema, unspecified eye: Secondary | ICD-10-CM

## 2014-09-24 MED ORDER — CARVEDILOL 12.5 MG PO TABS: 12.5000 mg | ORAL_TABLET | Freq: Two times a day (BID) | ORAL | Status: DC

## 2014-09-24 MED ORDER — VITAMIN D (ERGOCALCIFEROL) 1.25 MG (50000 UNIT) PO CAPS: 50000.0000 [IU] | ORAL_CAPSULE | ORAL | Status: DC

## 2014-09-24 NOTE — Progress Notes (Signed)
Pre visit review using our clinic review tool, if applicable. No additional management support is needed unless otherwise documented below in the visit note. 

## 2014-09-24 NOTE — Progress Notes (Signed)
Subjective:    Patient ID: Jacob Buck, male    DOB: [birth year], 74 y.o.   MRN: [id number]  HPI  Jacob Buck is a 74 yo male establishing care today.   1) Health Maintenance-   Diet- 2-3 x a days, eats at home mostly, wife or daughter cooks    Exercise- no formal,   Immunizations- 7 last year   Colonoscopy- 2014- Dr. Jamal Collin   Eye Exam- Legally blind in right eye    [facility]    No recent eye exam  Dental Exam- No dental insurance   2) Chronic Problems-  Dr. Jamal Collin and [facility]- 1991   Dr. Howell Rucks- Diabetes  Dr. Holley Raring for Kidneys  3) Acute Problems-  HTN: Atorvastatin and HCTZ- not taking    3 x a week   Levemir three x a day he reports (not how it was recommended to be taken) 15 units at meals three times daily  145 in the morning and a A1c around 8 he reports   Novolog 22 units bid also. He reports taking his insulin how he wants to. The daughter is very concerned about his non-compliance and his spells. 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   Review of Systems  Constitutional: Negative for fever, chills, diaphoresis and fatigue.  HENT: Negative for tinnitus and trouble swallowing.   Eyes: Negative for visual disturbance.  Respiratory: Negative for chest tightness, shortness of breath and wheezing.   Cardiovascular: Negative for chest pain, palpitations and leg swelling.  Gastrointestinal: Negative for nausea, vomiting and diarrhea.  Neurological: Positive for speech difficulty and numbness. Negative for dizziness, seizures and weakness.       Has spells with mumbling occasionally. They resolve  Hematological: Does not bruise/bleed easily.  Psychiatric/Behavioral: Negative for suicidal ideas and sleep disturbance. The patient is not nervous/anxious.    Past Medical History  Diagnosis Date  . Endocrine problem 2008  . Arthritis 2008  . Diabetes mellitus without complication  2263  . Bowel trouble 2008  . Diverticulosis   . 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    History   Social History  . Marital Status: Married    Spouse Name: N/A  . Number of Children: N/A  . Years of Education: N/A   Occupational History  . Not on file.   Social History Main Topics  . Smoking status: Never Smoker   . Smokeless tobacco: Never Used  . Alcohol Use: No  . Drug Use: No  . Sexual Activity: No   Other Topics Concern  . Not on file   Social History Narrative   Retired in 2006   Lives at home with wife   2 children daughters   Enjoys- fishing    Left handed    Pets- 2 inside dogs    Past Surgical History  Procedure Laterality Date  . Colonoscopy  2003, 2014  . Foot surgery Right 2013  . Colon surgery  2008    carcinoid    Family History  Problem Relation Age of Onset  . Diabetes Sister   . Alzheimer's disease Sister   . Diabetes Brother   . Heart disease Father     mild MI  . Alzheimer's disease Brother     Allergies  Allergen Reactions  . Toujeo Solostar [Insulin Glargine] Other (See Comments)    Numbness in arm    Current  Outpatient Prescriptions on File Prior to Visit  Medication Sig Dispense Refill  . insulin aspart (NOVOLOG FLEXPEN) 100 UNIT/ML FlexPen Inject 20 Units into the skin 2 (two) times daily before a meal. (Patient taking differently: Inject 22 Units into the skin 2 (two) times daily before a meal. ) 15 mL 4  . Insulin Detemir (LEVEMIR FLEXPEN) 100 UNIT/ML Pen Inject 40 Units into the skin daily at 10 pm. (Patient taking differently: Inject 15 Units into the skin 3 (three) times daily. ) 15 mL 4  . Insulin Pen Needle (BD PEN NEEDLE NANO U/F) 32G X 4 MM MISC Use for insulin administration three times daily 100 each 11  . atorvastatin (LIPITOR) 20 MG tablet Take 1 tablet (20 mg total) by mouth at bedtime. (Patient not taking: Reported on 09/24/2014) 30 tablet 3  . lisinopril (PRINIVIL,ZESTRIL) 40 MG tablet Take 40 mg by mouth  daily.      No current facility-administered medications on file prior to visit.      Objective:   Physical Exam  Constitutional: He is oriented to person, place, and time. He appears well-developed and well-nourished. No distress.  BP 144/70 mmHg  Pulse 93  Temp(Src) 98.4 F (36.9 C) (Oral)  Resp 14  Ht 6' 2.5" (1.892 m)  Wt 249 lb 8 oz (113.172 kg)  BMI 31.62 kg/m2  SpO2 98%   HENT:  Head: Normocephalic and atraumatic.  Right Ear: External ear normal.  Left Ear: External ear normal.  Cardiovascular: Normal rate, regular rhythm, normal heart sounds and intact distal pulses.  Exam reveals no gallop and no friction rub.   No murmur heard. Pulmonary/Chest: Effort normal and breath sounds normal. No respiratory distress. He has no wheezes. He has no rales. He exhibits no tenderness.  Neurological: He is alert and oriented to person, place, and time.  Skin: Skin is warm and dry. No rash noted. He is not diaphoretic.  Psychiatric: He has a normal mood and affect. His behavior is normal. Judgment and thought content normal.      Assessment & Plan:

## 2014-09-24 NOTE — Patient Instructions (Addendum)
Please take the Levemir 45 units at bedtime (ONCE A DAY)  Take the 22 units of Novolog at meal time (TWICE A DAY)   Take the Vitamin D pill (ONCE EVERY 7 DAYS)  Follow up with me in 2 months.

## 2014-09-30 ENCOUNTER — Ambulatory Visit: Payer: Medicare Other | Admitting: Nurse Practitioner

## 2014-09-30 ENCOUNTER — Ambulatory Visit: Payer: Medicare Other | Admitting: Endocrinology

## 2014-10-01 ENCOUNTER — Encounter: Payer: Self-pay | Admitting: Nurse Practitioner

## 2014-10-01 NOTE — Assessment & Plan Note (Signed)
  Lab Results  Component Value Date   CREATININE 1.61* 07/29/2014   BUN 26* 07/29/2014   NA 136 07/29/2014   K 4.4 07/29/2014   CL 102 07/29/2014   CO2 26 07/29/2014   Stable.

## 2014-10-01 NOTE — Assessment & Plan Note (Addendum)
Stable. No ulcer formation at this time. Daughter is concerned about potential ulceration and further amputations.

## 2014-10-01 NOTE — Assessment & Plan Note (Signed)
Neuropathy stable at this time. Not on any medications at this time. He is not interested in seeing podiatry at this time.

## 2014-10-01 NOTE — Assessment & Plan Note (Signed)
BP elevated. Needs refills of coreg. Asked pt to take starting today and FU in 2 months.

## 2014-10-01 NOTE — Assessment & Plan Note (Signed)
Went to [facility] years ago. No recent ophthalmic exam. Encouraged to set up appointment with [facility] for evaluation.

## 2014-10-01 NOTE — Assessment & Plan Note (Signed)
Establishing care today. Discussed acute and chronic issues. Reviewed health maintenance measures, PFSHx, and immunizations.

## 2014-10-01 NOTE — Assessment & Plan Note (Signed)
Followed by Dr. Howell Rucks. Pt is difficult and taking insulin how he wants despite interventions from endocrine and daughter. I spoke with him today about trying it for a week how it is supposed to be. Levemir 45 units at bedtime.  Novolog 22 units twice daily with brunch and supper  Continue to follow up with Dr. Howell Rucks  Follow up in 2 months with me.

## 2014-10-07 ENCOUNTER — Ambulatory Visit: Payer: Medicare Other | Admitting: Endocrinology

## 2014-10-09 ENCOUNTER — Ambulatory Visit: Payer: Medicare Other | Admitting: Nurse Practitioner

## 2014-10-09 ENCOUNTER — Ambulatory Visit (INDEPENDENT_AMBULATORY_CARE_PROVIDER_SITE_OTHER): Payer: Medicare Other | Admitting: Endocrinology

## 2014-10-09 ENCOUNTER — Encounter: Payer: Self-pay | Admitting: Endocrinology

## 2014-10-09 VITALS — BP 128/72 | HR 83 | Resp 16 | Ht 74.5 in | Wt 252.0 lb

## 2014-10-09 DIAGNOSIS — N183 Chronic kidney disease, stage 3 unspecified: Secondary | ICD-10-CM

## 2014-10-09 DIAGNOSIS — E1129 Type 2 diabetes mellitus with other diabetic kidney complication: Secondary | ICD-10-CM

## 2014-10-09 DIAGNOSIS — E1165 Type 2 diabetes mellitus with hyperglycemia: Secondary | ICD-10-CM

## 2014-10-09 DIAGNOSIS — E1122 Type 2 diabetes mellitus with diabetic chronic kidney disease: Secondary | ICD-10-CM

## 2014-10-09 DIAGNOSIS — I10 Essential (primary) hypertension: Secondary | ICD-10-CM

## 2014-10-09 DIAGNOSIS — IMO0002 Reserved for concepts with insufficient information to code with codable children: Secondary | ICD-10-CM

## 2014-10-09 DIAGNOSIS — E1142 Type 2 diabetes mellitus with diabetic polyneuropathy: Secondary | ICD-10-CM

## 2014-10-09 NOTE — Assessment & Plan Note (Signed)
Recent A1c elevated, but improved since last check. Goal is 7.5-8%.  Most sugars recently by recall are at goal. Denies any recent hypoglycemia.  I have asked him to start using his new meter and test strips ( already has one mail order one)- Set date and time right and then start using it.  Send me the sugars in 2 weeks for further adjustments.    Encouraged him to check sugars 4 x daily. Change levemir to 44 units daily ( explained need for using once daily). Continue Novolog at 22 units twice daily with brunch and supper.

## 2014-10-09 NOTE — Progress Notes (Signed)
Pre visit review using our clinic review tool, if applicable. No additional management support is needed unless otherwise documented below in the visit note. 

## 2014-10-09 NOTE — Progress Notes (Signed)
Patient ID: Jacob Buck, male   DOB: 1941-05-21, 74 y.o.   MRN: [id number]    Reason for visit-  Jacob Buck is a 74 y.o.-year-old male, for follow up management of Type 2 diabetes, uncontrolled, with complications ( Stage 3 CKD, proliferative retinopathy,neuropathy). Associated diagnosis of cardiomyopathy, though the patient denies history of it. Here with daughter. Last visit Jan 2016. Has appt with new PCP, Jacob Buck, since past visit.   HPI- Patient has been diagnosed with diabetes in 43s. Recalls being initially on lifestyle modifications.  Tried  Metformin in the past, but states that he was taken off as it was ineffective.  he has been on insulin since ~2010.   Pt is currently on a regimen of:  - Levemir 42 units pm ( now taking it as directed- he was using 15 units three times daily till recently to get his numbers down) - Novolog 22 units qac- BID (BF, supper)  Taking it consistently since last time and tolerating well.   Last hemoglobin A1c was: Lab Results  Component Value Date   HGBA1C 8.4* 08/26/2014   HGBA1C 11.8* 05/22/2014   HGBA1C 10.8* 02/18/2014    Pt checks his sugars 4 a day . Uses  Mail order glucometer. By meter review they are:*date incorrectly set  PREMEAL Breakfast Lunch Dinner Bedtime Overall  Glucose range:     30 day average 159 Range 94-190s  Mean/median:        POST-MEAL PC Breakfast PC Lunch PC Dinner  Glucose range:     Mean/median:       Hypoglycemia-  One low  On meter in the 50s, 2 other readings as "lo"- unclear whether this is recent. Patient and daughter deny any recent hypoglycemic episodes.  Previous hx of taking "too much insulin" per daughter.   Dietary habits- eats twice times daily- Brunch and supper. Southern style of cooking at home with bacon grease and such.Daughter reports that the patient eats often and doesn't control his portion sizes . Sprite about 1 weekly. This has changed over the past several months- now  controlling his portion sizes and types of food.   Exercise-  Doesn't exercise due to neuropathy. Walks with walker/cane,, where he is still feeling some weakness- which is getting better. Requests new walker with double wheels Weight - about stable Wt Readings from Last 3 Encounters:  10/09/14 252 lb (114.306 kg)  09/24/14 249 lb 8 oz (113.172 kg)  07/22/14 253 lb (114.76 kg)    Diabetes Complications-  Nephropathy- Yes, Stage 3  CKD, last BUN/creatinine- GFR 54 Lab Results  Component Value Date   BUN 28* 08/26/2014   CREATININE 1.6* 08/26/2014   Lab Results  Component Value Date   GFR 54.30* 07/29/2014   Lab Results  Component Value Date   MICRALBCREAT 78.7* 07/22/2014      Retinopathy- Yes, Last DEE was in 2009. Didn't go again because of finances . Has limited vision.  Neuropathy- has numbness and tingling in his feet. Known neuropathy. Prior hx toe amputation ( right, 5th) and DM ulcer ~2012. Has some sharp pains in his legs occasionally. Awakening him at night time. Is looking to go to a speciality neuropathy clinic in [place], but doesn't have the details.  Associated history - No CAD . No prior stroke. Chart mentions CMP. No hypothyroidism. his last TSH was No results found for: TSH  Hyperlipidemia-  his last set of lipids were- Currently on atorvastatin 20 mg daily. Tolerating well.  Recalls being on simvastatin and atorvastatin in the past.  Lab Results  Component Value Date   CHOL 155 07/22/2014   HDL 31.50* 07/22/2014   LDLDIRECT 41.0 07/22/2014   TRIG 538.0* 07/22/2014   CHOLHDL 5 07/22/2014    Blood Pressure/HTN- Patient's blood pressure is well controlled today on current regimen that includes ACE-I ( Lisinopril).   I have reviewed the patient's past medical history,medications and allergies.   Current Outpatient Prescriptions on File Prior to Visit  Medication Sig Dispense Refill  . aspirin 81 MG tablet Take 81 mg by mouth daily.    Marland Kitchen atorvastatin  (LIPITOR) 20 MG tablet Take 1 tablet (20 mg total) by mouth at bedtime. 30 tablet 3  . carvedilol (COREG) 12.5 MG tablet Take 1 tablet (12.5 mg total) by mouth 2 (two) times daily with a meal. 60 tablet 2  . insulin aspart (NOVOLOG FLEXPEN) 100 UNIT/ML FlexPen Inject 20 Units into the skin 2 (two) times daily before a meal. (Patient taking differently: Inject 22 Units into the skin 2 (two) times daily before a meal. ) 15 mL 4  . Insulin Detemir (LEVEMIR FLEXPEN) 100 UNIT/ML Pen Inject 40 Units into the skin daily at 10 pm. (Patient taking differently: Inject 42 Units into the skin at bedtime. ) 15 mL 4  . Insulin Pen Needle (BD PEN NEEDLE NANO U/F) 32G X 4 MM MISC Use for insulin administration three times daily 100 each 11  . lisinopril (PRINIVIL,ZESTRIL) 40 MG tablet Take 40 mg by mouth daily.     . Vitamin D, Ergocalciferol, (DRISDOL) 50000 UNITS CAPS capsule Take 1 capsule (50,000 Units total) by mouth every 7 (seven) days. 30 capsule 1   No current facility-administered medications on file prior to visit.   Allergies  Allergen Reactions  . Toujeo Solostar [Insulin Glargine] Other (See Comments)    Numbness in arm     Review of Systems- [ x ]  Complains of    [  ]  denies [  ] Recent weight change [  ]  Fatigue [  ] polydipsia [  ] polyuria [  ]  nocturia [  ]  vision difficulty [  ] chest pain [  ] shortness of breath [  ] leg swelling [  ] cough [  ] nausea/vomiting [  ] diarrhea [  ] constipation [  ] abdominal pain [ x ]  tingling/numbness in extremities [  ]  concern with feet ( wounds/sores)   PE: BP 128/72 mmHg  Pulse 83  Resp 16  Ht 6' 2.5" (1.892 m)  Wt 252 lb (114.306 kg)  BMI 31.93 kg/m2  SpO2 97% Wt Readings from Last 3 Encounters:  10/09/14 252 lb (114.306 kg)  09/24/14 249 lb 8 oz (113.172 kg)  07/22/14 253 lb (114.76 kg)   Exam: deferred  ASSESSMENT AND PLAN: Problem List Items Addressed This Visit      Cardiovascular and Mediastinum   Essential  hypertension, benign    BP at target today on current regimen. Urine MA abnormal jan 2016.              Endocrine   Type 2 diabetes, uncontrolled, with renal manifestation - Primary    Recent A1c elevated, but improved since last check. Goal is 7.5-8%.  Most sugars recently by recall are at goal. Denies any recent hypoglycemia.  I have asked him to start using his new meter and test strips ( already has one mail order one)- Set  date and time right and then start using it.  Send me the sugars in 2 weeks for further adjustments.    Encouraged him to check sugars 4 x daily. Change levemir to 44 units daily ( explained need for using once daily). Continue Novolog at 22 units twice daily with brunch and supper.                Nervous and Auditory   Diabetic neuropathy    Symptoms tolerable most times. Discussed use of gabapentin at low dose in the night and risk profile and common side effects. He wishes to consider referral to neuropathy clinic instead and will let me know of those details, so that referral could be placed.        Genitourinary   CKD stage 3 due to type 2 diabetes mellitus      Last labs were c/w Stage 3 CKD and stable.              - Return to clinic in  6 weeks with sugar log/meter.  Zanden Colver PUSHKAR 10/09/2014 1:19 PM

## 2014-10-09 NOTE — Patient Instructions (Addendum)
Check sugars 3 times daily ( fasting and premeal readings at alternating times, or at bedtime).  Record them in a sugar log and bring log and meter to next appointment.   Change levemir to 44 units daily at bedtime.  Continue current Novolog at 22 units twice daily.  Change to new meter. Set date and time on that. Send me sugars in 2 weeks.  Pt seeking referral to neuropathy center in [place]- they will fwd the details to me so that the referral could be placed.   Change in [place] requested- will fwd that to his PCP.  Please come back for a follow-up appointment in 6 weeks

## 2014-10-09 NOTE — Assessment & Plan Note (Signed)
Last labs were c/w Stage 3 CKD and stable.

## 2014-10-09 NOTE — Assessment & Plan Note (Signed)
Symptoms tolerable most times. Discussed use of gabapentin at low dose in the night and risk profile and common side effects. He wishes to consider referral to neuropathy clinic instead and will let me know of those details, so that referral could be placed.

## 2014-10-09 NOTE — Assessment & Plan Note (Signed)
BP at target today on current regimen. Urine MA abnormal jan 2016.

## 2014-10-16 ENCOUNTER — Other Ambulatory Visit: Payer: Self-pay | Admitting: Nurse Practitioner

## 2014-10-21 NOTE — Op Note (Signed)
PATIENT NAME:  Jacob Buck, Jacob Buck MR#:  [id number] DATE OF BIRTH:  Feb 19, 1941  DATE OF PROCEDURE:  02/14/2012  PREOPERATIVE DIAGNOSES:  1. 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  2. Urinary retention.  POSTOPERATIVE DIAGNOSES: 1. Urethral stricture disease. 2. Urinary retention.   PROCEDURE: Cystoscopy with visual internal urethrotomy and Foley catheter placement.   SURGEON: Maryan Puls, M.D.   ANESTHETIST: Dr. Marcello Moores   ANESTHESIA: Spinal.   INDICATIONS: See the dictated consultation. After informed consent, the patient requests the above procedure.   OPERATIVE SUMMARY: After adequate spinal anesthesia had been obtained, the patient was placed into the dorsal lithotomy position and the perineum was prepped and draped in the usual fashion. The 21 French cystoscope was coupled to the camera and then visually advanced into the urethra. The scope could only be advanced up to the bulbar urethra, at which point a stricture noted to be approximately 2 to 3 mm in caliber was identified. A 0.035 guidewire was advanced through the stricture under cystoscopic guidance. The cystoscope was then removed, taking care to leave the guidewire in position. The visual urethrotome was then coupled to the camera and advanced up to the stricture. The stricture was incised at the 12 o'clock position, eventually allowing entry into the bladder. The patient had moderate lateral lobe prostatic hypertrophy. The bladder was thoroughly inspected. Both ureteral orifices were identified and had clear efflux. No bladder tumors were identified. At this point the urethrotome was removed, taking care to leave the guidewire in position. A 20 French Councill catheter was advanced over the guidewire and positioned in the bladder. Guidewire was then removed.   Urologic portion of the procedure was terminated. The patient tolerated this portion of the procedure well.    ____________________________ Otelia Limes. Yves Dill,  MD [id number] D: 02/14/2012 12:58:58 ET T: 02/14/2012 13:28:23 ET JOB#: [id number]  cc: Otelia Limes. Yves Dill, MD, <Dictator> Royston Cowper MD ELECTRONICALLY SIGNED 02/14/2012 18:38

## 2014-10-21 NOTE — Consult Note (Signed)
Brief Consult Note: Diagnosis: Deep abcess and cellulitis secondary to Foreign Body right foot.   Patient was seen by consultant.   Consult note dictated.   Recommend to proceed with surgery or procedure.   Orders entered.   Comments: Needs deep I and D with removal of FB.  Will plan to take to OR at noon today.  Electronic Signatures: Perry Mount (MD)  (Signed 13-Aug-13 08:08)  Authored: Brief Consult Note   Last Updated: 13-Aug-13 08:08 by Perry Mount (MD)

## 2014-10-21 NOTE — H&P (Signed)
PATIENT NAME:  Jacob Buck, Jacob Buck MR#:  [id number] DATE OF BIRTH:  [birth year]  DATE OF ADMISSION:  02/13/2012  REFERRING PHYSICIAN: ER physician, Dr. Ulice Brilliant    PRIMARY CARE PHYSICIAN: Dr. Jeananne Rama   CHIEF COMPLAINT: Right foot swelling and drainage.   HISTORY OF PRESENT ILLNESS: The patient is a 74 year old male with past medical history of hypertension, diabetes, hyperlipidemia, history of colon cancer status post resection, and diabetic neuropathy who developed right foot swelling and drainage about a week ago. As per the patient's wife, she noticed some purulent drainage from the bottom of the foot. Therefore, the patient went to see his PCP who told him to come to the Emergency Room. In the ED the patient's x-ray showed possible radiopaque foreign body in the bottom of the right foot. The patient has neuropathy and is not aware of stepping on anything. He also has evidence of sepsis with fever of 102 and severe leukocytosis.   ALLERGIES: No known drug allergies.   PAST MEDICAL HISTORY:  1. Diabetes. 2. Hypertension. 3. Hyperlipidemia. 4. History of colon cancer, status post resection. 5. History of diabetic neuropathy.  6. History of diabetic foot ulcer. 7. Osteomyelitis, right fifth toe, status post amputation in 2009 by Dr. Cleda Mccreedy.  8. Cardiomyopathy. Echo showed EF of 35 to 45%. No wall motion abnormalities.   PAST SURGICAL HISTORY:  1. History of colon cancer, status post resection. 2. History of right foot fifth toe amputation in 2005 for osteomyelitis of right fifth toe.   CURRENT MEDICATIONS:  1. Lipitor 20 mg daily.  2. Coreg 25 mg b.i.d.  3. Levemir 30 units subcutaneously b.i.d. 4. Lisinopril 40 mg daily.  5. Metformin 1000 mg b.i.d.  6. NovoLog 70/30 25 units subcutaneously b.i.d.   SOCIAL HISTORY: The patient is married and lives with his wife. Denies any history of smoking, alcohol, or drug abuse.   FAMILY HISTORY: Mother is 81 years old mother. Mother had no  medical problems. Father had osteoarthritis. Diabetes on mother's side of the family.  REVIEW OF SYSTEMS: CONSTITUTIONAL: The patient denied any fever, however, was noted to have fever of 102 in the Emergency Room. EYES: Denies any blurred or double vision. ENT: Denies any tinnitus or ear pain. RESPIRATORY: Denies any cough, wheezing. CARDIOVASCULAR: Denies any chest pain or palpitations. GI: Denies any nausea, vomiting, abdominal pain. The patient reports that he has been having some loose bowel movements 2 to 3 times a day. Normally he goes once a day. ENDOCRINE: Denies any polyuria or nocturia. HEME/LYMPH: Denies any anemia or easy bruisability. INTEGUMENTARY: Denies any acne or rash. MUSCULOSKELETAL: Denies any pain in the neck, back, or shoulder. NEUROLOGICAL: Has diabetic neuropathy. Denies any vertigo or ataxia. PSYCH: Denies any anxiety or depression.   PHYSICAL EXAMINATION:   VITAL SIGNS: Temperature 102, heart rate 95, respiratory rate 20, blood pressure 140/73, pulse oximetry 96% on room air.   GENERAL: The patient is an elderly obese Caucasian male laying comfortably in bed not in acute distress.   HEAD: Atraumatic, normocephalic.   EYES: There is pallor. No icterus or cyanosis. Pupils equal, round, and reactive to light and accommodation. Extraocular movements intact. Wet mucous membranes. No oropharyngeal erythema or thrush.   NECK: Supple. No masses. No JVD. No thyromegaly or lymphadenopathy.   CHEST WALL: No tenderness to palpation. Not using accessory muscles of respiration. No intercostal muscle retractions.   LUNGS: Bilaterally clear to auscultation. No wheezing, rales, or rhonchi.   CARDIOVASCULAR: S1, S2 regular with systolic murmur.  No rubs or gallops.   ABDOMEN: Soft, obese, nontender, nondistended. No guarding or rigidity. Normal bowel sounds. Exam was difficult due to obesity.   SKIN: No rashes or lesions.   PERIPHERIES: 2+ pedal pulses of the left foot. The right  foot is swollen, erythematous. There is 1+ pitting edema. On the bottom of the foot there is a small area of fluctuance with purulent discharge and a bloody blister.   MUSCULOSKELETAL: No cyanosis or clubbing.   NEUROLOGIC: Awake, alert, oriented x3. Nonfocal neurological exam. Cranial nerves grossly intact.   PSYCH: Normal mood and affect.   LABORATORY, DIAGNOSTIC, AND RADIOLOGICAL DATA: ESR 127.   Right foot x-ray radiopaque foreign body likely representing a fractured needle within the subcutaneous tissue of the plantar aspect of the foot. Postsurgical changes involving the fifth digit.   Chest x-ray showed no acute abnormality.   CK 146. Troponin 0.10. White count 23.6, hemoglobin 9.4, hematocrit 27.6, platelet count 217, glucose 231, BUN 18, creatinine 1.57, sodium 129, potassium 4, chloride 94, bilirubin 1.4, alkaline phosphatase 119, ALT 94, AST 63, total protein 7.7, albumin 3.1.   ASSESSMENT AND PLAN: This is a 74 year old male with past medical history of diabetes, hypertension, and hyperlipidemia who presents with right foot swelling and drainage.  1. Sepsis. The patient is presenting with signs of sepsis. He has high-grade fever and leukocytosis.   2. Right foot infection/abscess with radiopaque foreign body on x-ray. The patient has neuropathy and does not recall stepping on any foreign body. Will obtain blood and wound cultures. Will start him on empiric broad-spectrum antibiotics to cover gram-positive, gram-negative, anaerobes, and MRSA. I have called and spoken with Dr. Cleda Mccreedy who had done his prior right fifth toe amputation Dr. Cleda Mccreedy reports that Dr. Elvina Mattes is on call and Dr. Cleda Mccreedy will inform him of the consult.  3. Leukocytosis, likely due to above. Will monitor response on antibiotics. 4. Anemia, likely of chronic disease. No old labs available for review.  5. Diabetes. Will place on insulin sliding scale, ADA diet. Continue his Levemir and NovoLog 70/30. Will check a  hemoglobin A1c. The patient will require strict control of diabetes to ensure adequate healing of the right foot infection.  6. Possible chronic kidney disease. No old labs are available. The patient has diabetes and hypertension. This could be related to diabetic/hypertensive nephropathy versus use of ACE inhibitor. The patient had cardiomyopathy on prior echo. Therefore, will continue his lisinopril for the time being and monitor his renal function closely.  7. Hyponatremia. Etiology is unclear. The patient does not appear to be symptomatic from it. Will hydrate with IV fluids and reassess and recheck levels in a.m.  8. Elevated liver function tests. The patient denies any abdominal pain. He has minimally elevated bilirubin, AST and ALT. He denies any history of alcohol abuse. Will do an abdominal ultrasound, hepatitis panel, and hold his statin therapy.  9. Elevated troponin. The patient has no complaints of chest pain. His EKG shows no acute ischemic changes. This could be related to demand ischemia versus poor renal clearance from chronic kidney disease. Will check serial cardiac enzymes and monitor on telemetry. The patient is already on beta-blocker. 10. Hypertension. Will check a 2-D echo and start on aspirin. The patient is already on beta-blocker. Will monitor on telemetry. Start him on aspirin. He is already on a beta-blocker and ACE inhibitor. Will check a 2-D echo.  11. Hypertension, appears to be well controlled at present. Will continue present management.  Discussed with Dr. Cleda Mccreedy, discussed with patient and his wife the plan of care and management.   TIME SPENT: 75 minutes.   ____________________________ Cherre Huger, MD sp:drc D: 02/13/2012 17:16:38 ET T: 02/13/2012 17:48:06 ET JOB#: [id number]  cc: Cherre Huger, MD, <Dictator>, Guadalupe Maple, MD Cherre Huger MD ELECTRONICALLY SIGNED 02/13/2012 18:47

## 2014-10-21 NOTE — Op Note (Signed)
PATIENT NAME:  Jacob Buck, Jacob Buck MR#:  [id number] DATE OF BIRTH:  09-11-1940  DATE OF PROCEDURE:  02/14/2012  PREOPERATIVE DIAGNOSIS: Deep abscess right foot with foreign body.   POSTOPERATIVE DIAGNOSIS: Deep abscess right foot with foreign body.   PROCEDURE: Removal of foreign body and incision and drainage of deep abscess with removal of infected soft tissue and wound culture, deep tissue culture of the wound.     HISTORY OF PRESENT ILLNESS: The patient stepped on likely an insulin needle broken probably two weeks ago. He noticed some drainage and he may have stepped on it even before then. He did not come to the hospital and did not seek medical attention. He was admitted to the ER yesterday.   ANESTHESIA: Monitored anesthesia care with Dr. Boston Service.  SURGEON:  M. Raequan Vanschaick, DPM   ASSISTANT:  None.  DESCRIPTION OF PROCEDURE:  The patient was brought to the operating room and placed on the operating room table in the supine position. At this point after sedation was achieved, attention was directed to the plantar aspect of the right foot. The patient is extremely neuropathic and has really no pain sensation to his foot. At this point, the puncture wound was noted and probed into a deep area. There was also noted skin necrosis and infection medial to the puncture wound. At this point, an incision was made from here medially into the infected tissue deep and there was extensive pus noted deep to this incision margin all around the plantar fascial ligament. At this point a combination of blunt dissection and VersaJet dissection and clean debridement was used to remove all infected tissue. The plantar fascial ligament in that region was seen to be extensively involved. This was thinned out so I went ahead and resected and removed a portion of it so it would allow better drainage and not trap any purulence in the regions. The area was explored medially. There were a couple of sinus tracks that  fortunately did not to seem to extend into a tendon or fascial layers other than just medial. Some of the area around the abductor hallucis muscle belly was noted, but did not seem to be involved with it from an overall standpoint. Once this debridement was achieved, the area was compressed and squeezed and no more purulence was seen to emit from any zones. At this point, the area was copiously irrigated and cleaned up again with the VersaJet.  Once it was copiously irrigated, 1-0 nylon was used to close the plantar aspect of the incision, but there was some extensive tissue loss, skin loss at the medial aspect where the infection abscess area had become more superficial in its infection process. This poor tissue/nonviable tissue was resected and debrided and this area was left open. The wound was then packed with sterile saline-soaked gauze. The wound itself following debridement was approximately 7 cm x  5 cm at the medial aspect of it once I closed the plantar aspect. The lateral two-thirds of the wound I was able to close primarily. The medial third was packed and once his infection is under better control I will probably put him on a wound VAC.   The patient tolerated the procedure and anesthesia well and left the operating room for the recovery room with vital signs stable. Blood loss was approximately 25 mL.    ____________________________ Gerrit Heck. Naelani Lafrance, DPM mgt:bjt D: 02/14/2012 13:39:29 ET T: 02/14/2012 14:05:07 ET JOB#: [id number]  cc: Gerrit Heck Guiseppe Flanagan, DPM, <Dictator> Hong Moring  Philbert Riser MD ELECTRONICALLY SIGNED 03/20/2012 12:44

## 2014-10-21 NOTE — Consult Note (Signed)
Brief Consult Note: Diagnosis: Urinary retention.   Patient was seen by consultant.   Consult note dictated.   Recommend to proceed with surgery or procedure.   Orders entered.   Discussed with Attending MD.   Comments: Plan cystoscopy and foley placement.  Electronic Signatures: Royston Cowper (MD)  (Signed 13-Aug-13 12:32)  Authored: Brief Consult Note   Last Updated: 13-Aug-13 12:32 by Royston Cowper (MD)

## 2014-10-21 NOTE — Consult Note (Signed)
PATIENT NAME:  Jacob Buck, Jacob Buck MR#:  [id number] DATE OF BIRTH:  [birth year]  DATE OF CONSULTATION:  02/14/2012  REFERRING PHYSICIAN: Loletha Grayer, MD  and Albertine Patricia, DPM   CONSULTING PHYSICIAN:  Otelia Limes. Yves Dill, MD  REASON FOR CONSULTATION: Nursing staff cannot place Foley.   HISTORY OF PRESENT ILLNESS: Jacob Buck is a 74 year old African American male admitted to the hospital with right foot swelling. A bladder scan indicated 400 mL within the bladder, and several attempts by the nursing staff at placing a Foley catheter were not successful. The patient denies any urinary symptoms, but his wife states that he has a very weak stream, and drips when he voids, and gets up several times at night. He also states that his primary care physician has suggested Urological referral but he declined.   ALLERGIES: No drug allergies.   CHRONIC HOME MEDICATIONS: Lipitor, Coreg, Levemir, Lisinopril, metformin and  NovoLog insulin.   PAST SURGICAL HISTORY:  1. Colon resection due to cancer. 2. Right fifth toe amputation.   SOCIAL HISTORY: The patient denied tobacco or alcohol use.   FAMILY HISTORY: Remarkable for diabetes.   PAST AND CURRENT MEDICAL CONDITIONS:  1. Diabetes.  2. Hypertension.  3. Hyperlipidemia.  4. History of colon cancer. 5. Diabetic neuropathy.  6. Diabetic foot ulcer. 7. Osteomyelitis of the right fifth toe.  8. Cardiomyopathy.   REVIEW OF SYSTEMS: The patient does not recall having had a recent DRE or PSA. He denied any obstructive symptoms.   PHYSICAL EXAMINATION:  GENERAL: A well-nourished African American male in no distress.   ABDOMEN: Abdomen was soft. No palpable abdominal masses. Bladder was not palpably distended.   GENITOURINARY: Uncircumcised. Testes atrophic, 12 mL in size each.   RECTAL: 35 grams smooth nontender prostate.   PERTINENT LABORATORY STUDIES: BUN 19 and creatinine 1.79.   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   PLAN: I will attempt to place a Foley catheter while the patient is under general anesthesia today for toe amputation. The procedure, risks, benefits, and alternatives were discussed with the patient and his wife. They seemed to understand and would like to proceed.   ____________________________ Otelia Limes. Yves Dill, MD mrw:cbb D: 02/14/2012 12:54:57 ET T: 02/14/2012 13:21:02 ET JOB#: [id number]  cc: Otelia Limes. Yves Dill, MD, <Dictator> Royston Cowper MD ELECTRONICALLY SIGNED 02/14/2012 13:37

## 2014-10-21 NOTE — Consult Note (Signed)
PATIENT NAME:  Jacob Buck, Jacob Buck MR#:  [id number] DATE OF BIRTH:  Dec 23, 1940  DATE OF CONSULTATION:  02/14/2012  REFERRING PHYSICIAN:   CONSULTING PHYSICIAN:  Gerrit Heck. Nairi Oswald, DPM  HISTORY OF PRESENT ILLNESS: The patient was admitted through the Emergency Room yesterday for swelling and redness to the right foot. X-ray showed a broken needle in his right foot. He has extensive cellulitis and purulence draining from the area at this timeframe. He was admitted through the Emergency Room last night. He states that he noticed some swelling and drainage about a week ago. His wife noticed some purulent drainage and went to the primary care doctor who referred him to the ED.   PRIMARY CARE DOCTOR: Dr. Jeananne Rama    PAST MEDICAL HISTORY:  1. Diabetes, type II.  2. Hypertension.  3. Hyperlipidemia.  4. History of colon cancer. He is status post resection on that. 5. History of significant diabetic neuropathy with a previous surgery on his right fifth toe by Dr. Cleda Mccreedy with amputation in 2009. 6. History of cardiomyopathy with ejection fraction of 35 to 45%.   PAST SURGICAL HISTORY:  1. Colon cancer resection. 2. Fifth toe amputation.   CURRENT MEDICATIONS:  1. Lipitor 20 mg daily. 2. Coreg 25 mg b.i.d. 3. Levemir 30 units sub-Q b.i.d.  4. Lisinopril 40 mg daily.  5. Metformin 1000 mg b.i.d.  6. NovoLog 70/30 25 units sub-Q b.i.d.   SOCIAL HISTORY: He is married and lives with his wife. Denies smoking. Denies EtOH. Denies recreational drug use.   VITAL SIGNS: Blood pressure 121/77, temperature 98, pulse 91, pulse oximetry 91%.   LABORATORY, DIAGNOSTIC, AND RADIOLOGICAL DATA: Lab results have shown elevated white count at 19.6. He is a little anemic with a hemoglobin of 7.9 and hematocrit 23.7. Red blood cells are 2.52. Neutrophils are left shift at 15.9. He has an elevated sedimentation rate also of 127.   X-ray showed a foreign body in the right foot, appeared to be a fractured  needle.  LOWER EXTREMITY EXAM: Vasculature DP pulses are difficult to palpate in the right foot because of swelling. They are palpable on the left. Dermatologically he has a puncture wound on the right foot where there is some heavy drainage coming out of that area. I opened that up some more today and there was significant purulence leaking from the plantar wound. He had a large blister formation that extended medially. This was debrided off. Most of the skin was intact in this region but there was evidence of a sinus track probably connecting with the needle area. His foot is swollen and cellulitic.   He has been febrile for the last few days but currently is 98. He is on Zosyn intravenously at this juncture.  CLINICAL IMPRESSION: Foreign body with deep tissue infection.   TREATMENT PLAN: I opened it up some today to see if I saw any superficial evidence of the foreign body. I could not see anything. I will go ahead and irrigate that and clean it out some today, put a dressing on him today, put him on n.p.o. and plan on doing surgery on it at lunchtime to open this up and get it stabilized some more. I will try to get that worked up, ordered consent sign to be signed by him and have notified the nurse in the operating room regarding that. I will see him at lunchtime and see if we can get this started to heal up some.   ____________________________ Gerrit Heck Johan Antonacci, DPM  mgt:drc D: 02/14/2012 08:05:10 ET T: 02/14/2012 11:02:16 ET JOB#: [id number]  cc: Gerrit Heck Jakia Kennebrew, DPM, <Dictator> Perry Mount MD ELECTRONICALLY SIGNED 03/20/2012 12:44

## 2014-10-21 NOTE — Discharge Summary (Signed)
PATIENT NAME:  Jacob Buck, Jacob Buck MR#:  [id number] DATE OF BIRTH:  05-Mar-1941  DATE OF ADMISSION:  02/13/2012 DATE OF DISCHARGE:  02/20/2012  ADMITTING DIAGNOSIS: Right foot swelling and drainage.  DISCHARGE DIAGNOSES:  1. Right foot swelling and drainage due to infection/abscess with radiopaque foreign body in the right foot.  2. Sepsis due to right foot infection.  3. Leukocytosis.  4. Acute on chronic anemia.  5. Diabetes  6. Acute systolic congestive heart failure improved with IV Lasix.  7. 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  8. Hyponatremia. 9. Slightly elevated liver function tests, now improved.  10. Neuropathy involving the foot.   PERTINENT LABS AND EVALUATIONS: Admitting glucose 231, BUN 18, creatinine 1.57, sodium 129, potassium 4.0, chloride 94, and CO2 25. WBC count 23.6, hemoglobin 9.4, and platelet count 217. Troponin was slightly elevated 0.10 then subsequent 0.09 and 0.10.   Blood cultures x2 - no growth.   Wound culture intraoperatively showed heavy Escherichia coli, Enterobacter cloaca, and Klebsiella oxytoca as well as Citrobacter Braakii and Aeromonas hydrophila/caviae.   X-ray of his foot on presentation showed radiopaque foreign body likely representing a fractured needle within the subcutaneous tissue.   PA and lateral chest x-ray on presentation showed shallow inspiration and there is thickening of the interstitial markings. No focal consolidation.   B12 level was 1154. Ferritin level was elevated at 644. Iron saturation 20. Most recent creatinine is 1.46. His WBC has trended down to 13.3 and hemoglobin is 8.8 today.   PROCEDURES: Deep abscess of right foot with foreign body status post deep incision and drainage of right foot abscess with foreign body removal, status post wound VAC placement.  Ureteral retention status post cystoscopy VIU and catheter placement. A  bulbourethral stricture was noted.  HOSPITAL COURSE: Please refer to the history and physical done by the admitting physician. The patient is a 74 year old male with medical history of hypertension, diabetes, hyperlipidemia, history of colon cancer status post resection, and diabetic neuropathy who developed right foot swelling and drainage about a week ago. He went to see his primary care physician who recommended for him to come to the ED. The patient in the ED was noted to have a possible radiopaque foreign body on the bottom of the foot. He was also noticed to have fever of 102, severe leukocytosis. He was admitted and placed on broad-spectrum antibiotics and thought to have possible cellulitis related to his foot infection. He was seen by Dr. Elvina Mattes of podiatry who proceeded to take the patient to the operating room and did incision and drainage of the deep abscess as well as removal of the foreign body. The patient had a wound VAC placed. He has been kept on IV antibiotics. His cultures from his foot show polymicrobial organisms. His antibiotic coverage was broadened down. Now he will be placed on oral antibiotics. The patient also had a wound VAC placed which will be continued. Dr. Elvina Mattes will see him this coming Thursday to reevaluate the wound. The patient also had difficulty with urinary retention and was seen by Dr. Yves Dill who also saw the patient and during his foot surgery he did a cystoscopy with Foley placement. The patient was noted to have an ureteral stricture. The patient's Foley was kept in until 02/08/2012 and was subsequently removed. He has not had any further problems with voiding. The patient also developed some hypoxia and required high oxygen. Therefore, he had a  chest x-ray which suggested possible congestive heart failure. He was given IV Lasix with improvement in his symptoms. He has continued to require 3 to 4 liters of oxygen which we will continue and be able to slowly wean off.  At this time arrangements are made for him to be discharged to a skilled nursing facility.   The patient is to be weighed every day at the same time first thing in the morning, notify physician if gain of more than 2 pounds in one day or 5 pounds in one week, for increased weight gain, tiredness, swelling of the legs or feet, or shortness of breath call 911.   DISCHARGE MEDICATIONS:  1. Atorvastatin 20 mg daily.  2. Carvedilol 25 mg 1 tab p.o. twice a day. 3. Insulin Levemir 20 units subcutaneous twice a day. 4. Tylenol 650 mg every four hours p.r.n.  5. Flomax 0.4 mg daily.  6. Aspirin 81 mg 1 tab p.o. daily.  7. Amlodipine 5 mg daily.  8. Ranitidine 150 mg at bedtime.  9. Iron sulfate 325 mg 1 tab p.o. twice a day.  10. Docusate 100 mg 1 tab p.o. twice a day. 11. Levaquin 500 mg 1 tab p.o. every 24 hours x10 days. 12. Augmentin 1 tab p.o. every 12 hours x10 days.   WOUND CARE:  The patient has a wound vac. He will follow with Dr. Elvina Mattes next Thursday.  HOME OXYGEN: 3 liters.   DIET: Low fat, low cholesterol, carbohydrate-controlled diet. Diet consistency regular. Accu-Cheks before meals and at bedtime.   DISCHARGE INSTRUCTIONS: Incentive spirometry every 1 hour while awake. The patient is to have a CBC and a BMP check in five days with results to Dr. Jeananne Rama. The patient is to have nonweightbearing on the right leg, per Dr. Elvina Mattes.   REFERRAL: He is being referred to rehab.   TIMEFRAME FOR FOLLOW UP: He will followup with Dr. Elvina Mattes this Thursday and in 1 to 2 weeks with Dr. Golden Pop, his primary MD, and in 2 to 4 weeks with Dr. Yves Dill of urology.  TIME SPENT: 45 minutes spent. ____________________________ Lafonda Mosses. Posey Pronto, MD shp:slb D: 02/20/2012 14:59:45 ET     T: 02/20/2012 15:42:46 ET        JOB#: [id number] cc: Vickki Igou H. Posey Pronto, MD, <Dictator> Guadalupe Maple, MD Alric Seton MD ELECTRONICALLY SIGNED 02/24/2012 16:01

## 2014-11-25 ENCOUNTER — Ambulatory Visit (INDEPENDENT_AMBULATORY_CARE_PROVIDER_SITE_OTHER): Payer: Medicare Other | Admitting: Endocrinology

## 2014-11-25 ENCOUNTER — Telehealth: Payer: Self-pay

## 2014-11-25 ENCOUNTER — Telehealth: Payer: Self-pay | Admitting: *Deleted

## 2014-11-25 ENCOUNTER — Ambulatory Visit (INDEPENDENT_AMBULATORY_CARE_PROVIDER_SITE_OTHER): Payer: Medicare Other | Admitting: Nurse Practitioner

## 2014-11-25 VITALS — BP 116/64 | HR 73 | Temp 97.7°F | Resp 18 | Ht 75.0 in | Wt 253.8 lb

## 2014-11-25 VITALS — BP 116/64 | HR 73 | Resp 16 | Ht 75.0 in | Wt 253.8 lb

## 2014-11-25 DIAGNOSIS — E782 Mixed hyperlipidemia: Secondary | ICD-10-CM

## 2014-11-25 DIAGNOSIS — E1122 Type 2 diabetes mellitus with diabetic chronic kidney disease: Secondary | ICD-10-CM

## 2014-11-25 DIAGNOSIS — N183 Chronic kidney disease, stage 3 (moderate): Secondary | ICD-10-CM

## 2014-11-25 DIAGNOSIS — IMO0002 Reserved for concepts with insufficient information to code with codable children: Secondary | ICD-10-CM

## 2014-11-25 DIAGNOSIS — E11359 Type 2 diabetes mellitus with proliferative diabetic retinopathy without macular edema: Secondary | ICD-10-CM

## 2014-11-25 DIAGNOSIS — E1129 Type 2 diabetes mellitus with other diabetic kidney complication: Secondary | ICD-10-CM

## 2014-11-25 DIAGNOSIS — R4689 Other symptoms and signs involving appearance and behavior: Secondary | ICD-10-CM | POA: Diagnosis not present

## 2014-11-25 DIAGNOSIS — E1142 Type 2 diabetes mellitus with diabetic polyneuropathy: Secondary | ICD-10-CM | POA: Diagnosis not present

## 2014-11-25 DIAGNOSIS — E1165 Type 2 diabetes mellitus with hyperglycemia: Secondary | ICD-10-CM

## 2014-11-25 DIAGNOSIS — I10 Essential (primary) hypertension: Secondary | ICD-10-CM

## 2014-11-25 DIAGNOSIS — E113599 Type 2 diabetes mellitus with proliferative diabetic retinopathy without macular edema, unspecified eye: Secondary | ICD-10-CM

## 2014-11-25 LAB — COMPREHENSIVE METABOLIC PANEL
ALT: 17 U/L (ref 0–53)
AST: 17 U/L (ref 0–37)
Albumin: 4.4 g/dL (ref 3.5–5.2)
Alkaline Phosphatase: 58 U/L (ref 39–117)
BILIRUBIN TOTAL: 0.7 mg/dL (ref 0.2–1.2)
BUN: 28 mg/dL — ABNORMAL HIGH (ref 6–23)
CO2: 28 meq/L (ref 19–32)
Calcium: 9.4 mg/dL (ref 8.4–10.5)
Chloride: 100 mEq/L (ref 96–112)
Creatinine, Ser: 1.41 mg/dL (ref 0.40–1.50)
GFR: 63.23 mL/min (ref >60.00)
Glucose, Bld: 47 mg/dL — CL (ref 70–99)
Potassium: 4.5 mEq/L (ref 3.5–5.1)
Sodium: 137 mEq/L (ref 135–145)
TOTAL PROTEIN: 7.4 g/dL (ref 6.0–8.3)

## 2014-11-25 LAB — LIPID PANEL
CHOLESTEROL: 152 mg/dL (ref 0–200)
HDL: 35.2 mg/dL — AB (ref >39.00)
NONHDL: 116.8
TRIGLYCERIDES: 322 mg/dL — AB (ref 0.0–149.0)
Total CHOL/HDL Ratio: 4
VLDL: 64.4 mg/dL — ABNORMAL HIGH (ref 0.0–40.0)

## 2014-11-25 LAB — HEMOGLOBIN A1C: HEMOGLOBIN A1C: 7.6 % — AB (ref 4.6–6.5)

## 2014-11-25 LAB — LDL CHOLESTEROL, DIRECT: LDL DIRECT: 61 mg/dL

## 2014-11-25 MED ORDER — ROSUVASTATIN CALCIUM 5 MG PO TABS: 5.0000 mg | ORAL_TABLET | Freq: Every day | ORAL | Status: DC

## 2014-11-25 NOTE — Patient Instructions (Signed)
Continue levemir 42 units daily at night time.  Take Novolog 22 units JUST prior to meals.  Labs today.  Please come back for a follow-up appointment in 3 months

## 2014-11-25 NOTE — Assessment & Plan Note (Signed)
Encouraged him to follow back with opthalmologist.

## 2014-11-25 NOTE — Progress Notes (Signed)
Pre visit review using our clinic review tool, if applicable. No additional management support is needed unless otherwise documented below in the visit note. 

## 2014-11-25 NOTE — Assessment & Plan Note (Signed)
Last A1c elevated, but improved since last check. Goal is 7.5-8%. Update today.  Most sugars recently by recall are at goal. Reports recent hypoglycemia.  Reviewed correct administration of Novolog. Perhaps his "spells ' have been related to the timing of his Novolog. Also reviewed effect of high carb foods, like pancake meals, on sugars.  Reviewed that he should be checking and then treating his sugars rather than reflexively treating "lows"  Encouraged him to check sugars 4 x daily. Continue Levemir at 42 units daily ( explained need for using once daily). Continue Novolog at 22 units twice daily with brunch and supper.

## 2014-11-25 NOTE — Progress Notes (Signed)
Reason for visit-  Jacob Buck is a 74 y.o.-year-old male, for follow up management of Type 2 diabetes, uncontrolled, with complications ( Stage 3 CKD, proliferative retinopathy,neuropathy). Associated diagnosis of cardiomyopathy, though the patient denies history of it. Here with daughter. Last visit April 2016.   HPI- Patient has been diagnosed with diabetes in 85s. Recalls being initially on lifestyle modifications.  Tried  Metformin in the past, but states that he was taken off as it was ineffective.  he has been on insulin since ~2010.   Pt is currently on a regimen of:  - Levemir 42 units pm ( supposed to be on 44 units) - Novolog 22 units qac- BID (BF, supper)  Taking it consistently since last time and tolerating well.  * on further questioning - he has been taking Novolog on awakening and very often time between Novolog and his brunch is about 30 min to 1 hour. Recently had a spell of probably low FS after eating pancakes , didn't call EMS, doesn't always check his sugars during hypoglycemia Daughter( in the past) has reported that even when she calls EMS, patient refuses to go to the hospital.  For supper time dosing- he takes novolog right before the meal.  On further questioning- "spells" happen only in morning time   Last hemoglobin A1c was: Lab Results  Component Value Date   HGBA1C 8.4* 08/26/2014   HGBA1C 11.8* 05/22/2014   HGBA1C 10.8* 02/18/2014    Pt checks his sugars 2 a day . Uses  Mail order glucometer. By recall they are:  PREMEAL Breakfast Lunch Dinner Bedtime Overall  Glucose range: 135-156   130-162   Mean/median:        POST-MEAL PC Breakfast PC Lunch PC Dinner  Glucose range:     Mean/median:       Hypoglycemia-  One recent low?  Patient and daughter deny any recent hypoglycemic episodes.  Previous hx of taking "too much insulin" per daughter.   Dietary habits- eats twice times daily- Brunch and supper. Southern style of cooking at home  with bacon grease and such.Daughter reports that the patient eats often and doesn't control his portion sizes . Sprite about 1 weekly. This has changed over the past several months- now controlling his portion sizes and types of food.   Exercise-  Doesn't exercise due to neuropathy. Walks with walker/cane,, where he is still feeling some weakness- which is getting better. Wants to go to neuropathy center in [place]- dont have the details whether they would like to be referred to Weight - about stable Wt Readings from Last 3 Encounters:  11/25/14 253 lb 12 oz (115.1 kg)  11/25/14 253 lb 12.8 oz (115.123 kg)  10/09/14 252 lb (114.306 kg)    Diabetes Complications-  Nephropathy- Yes, Stage 3  CKD, last BUN/creatinine- GFR 54 Lab Results  Component Value Date   BUN 28* 08/26/2014   CREATININE 1.6* 08/26/2014   Lab Results  Component Value Date   GFR 54.30* 07/29/2014   Lab Results  Component Value Date   MICRALBCREAT 78.7* 07/22/2014      Retinopathy- Yes, Last DEE was in 2009. Didn't go again because of finances . Has limited vision.  Neuropathy- has numbness and tingling in his feet. Known neuropathy. Prior hx toe amputation ( right, 5th) and DM ulcer ~2012. Has some sharp pains in his legs occasionally. Awakening him at night time. Is looking to go to a speciality neuropathy clinic in [place], but doesn't have the  details.  Associated history - No CAD . No prior stroke. Chart mentions CMP. No hypothyroidism. his last TSH was No results found for: TSH  Hyperlipidemia-  his last set of lipids were- Currently on atorvastatin 20 mg daily. Tolerating well.  Recalls being on simvastatin and atorvastatin in the past.  Lab Results  Component Value Date   CHOL 155 07/22/2014   HDL 31.50* 07/22/2014   LDLDIRECT 41.0 07/22/2014   TRIG 538.0* 07/22/2014   CHOLHDL 5 07/22/2014    Blood Pressure/HTN- Patient's blood pressure is well controlled today on current regimen  that includes ACE-I ( Lisinopril).   I have reviewed the patient's past medical history,medications and allergies.   Current Outpatient Prescriptions on File Prior to Visit  Medication Sig Dispense Refill  . aspirin 81 MG tablet Take 81 mg by mouth daily.    . carvedilol (COREG) 12.5 MG tablet Take 1 tablet (12.5 mg total) by mouth 2 (two) times daily with a meal. 60 tablet 2  . insulin aspart (NOVOLOG FLEXPEN) 100 UNIT/ML FlexPen Inject 20 Units into the skin 2 (two) times daily before a meal. (Patient taking differently: Inject 22 Units into the skin 2 (two) times daily before a meal. ) 15 mL 4  . Insulin Detemir (LEVEMIR FLEXPEN) 100 UNIT/ML Pen Inject 40 Units into the skin daily at 10 pm. (Patient taking differently: Inject 42 Units into the skin at bedtime. ) 15 mL 4  . Insulin Pen Needle (BD PEN NEEDLE NANO U/F) 32G X 4 MM MISC Use for insulin administration three times daily 100 each 11  . lisinopril (PRINIVIL,ZESTRIL) 40 MG tablet Take 40 mg by mouth daily.     . rosuvastatin (CRESTOR) 5 MG tablet Take 1 tablet (5 mg total) by mouth daily. 30 tablet 2  . Vitamin D, Ergocalciferol, (DRISDOL) 50000 UNITS CAPS capsule Take 1 capsule (50,000 Units total) by mouth every 7 (seven) days. 30 capsule 1   No current facility-administered medications on file prior to visit.   Allergies  Allergen Reactions  . Toujeo Solostar [Insulin Glargine] Other (See Comments)    Numbness in arm     Review of Systems- [ x ]  Complains of    [  ]  denies [  ] Recent weight change [  ]  Fatigue [  ] polydipsia [  ] polyuria [  ]  nocturia [  ]  vision difficulty [  ] chest pain [  ] shortness of breath [  ] leg swelling [  ] cough [  ] nausea/vomiting [  ] diarrhea [  ] constipation [  ] abdominal pain [ x ]  tingling/numbness in extremities [  ]  concern with feet ( wounds/sores)   PE: BP 116/64 mmHg  Pulse 73  Resp 16  Ht 6\' 3"  (1.905 m)  Wt 253 lb 12 oz (115.1 kg)  BMI 31.72 kg/m2  SpO2  99% Wt Readings from Last 3 Encounters:  11/25/14 253 lb 12 oz (115.1 kg)  11/25/14 253 lb 12.8 oz (115.123 kg)  10/09/14 252 lb (114.306 kg)   Exam: deferred  ASSESSMENT AND PLAN: Problem List Items Addressed This Visit      Cardiovascular and Mediastinum   Essential hypertension, benign    BP at target today on current regimen. Urine MA abnormal jan 2016.              Relevant Orders   Comprehensive metabolic panel   Hemoglobin A1c   Lipid panel  Endocrine   Type 2 diabetes, uncontrolled, with renal manifestation - Primary    Last A1c elevated, but improved since last check. Goal is 7.5-8%. Update today.  Most sugars recently by recall are at goal. Reports recent hypoglycemia.  Reviewed correct administration of Novolog. Perhaps his "spells ' have been related to the timing of his Novolog. Also reviewed effect of high carb foods, like pancake meals, on sugars.  Reviewed that he should be checking and then treating his sugars rather than reflexively treating "lows"  Encouraged him to check sugars 4 x daily. Continue Levemir at 42 units daily ( explained need for using once daily). Continue Novolog at 22 units twice daily with brunch and supper.                Relevant Orders   Comprehensive metabolic panel   Hemoglobin A1c   Lipid panel     Nervous and Auditory   Diabetic neuropathy    Symptoms tolerable most times. Discussed use of gabapentin at low dose in the night and risk profile and common side effects at last visit. He wishes to consider referral to neuropathy clinic instead and will let me know of those details, so that referral could be placed.        Relevant Orders   Comprehensive metabolic panel   Hemoglobin A1c   Lipid panel     Genitourinary   CKD stage 3 due to type 2 diabetes mellitus      Last labs were c/w Stage 3 CKD and stable. Update today.            Relevant Orders   Comprehensive metabolic panel   Hemoglobin  A1c   Lipid panel     Other   Diabetic retinopathy    Encouraged him to follow back with opthalmologist.       Relevant Orders   Comprehensive metabolic panel   Hemoglobin A1c   Lipid panel   Hypercholesterolemia with hypertriglyceridemia    Is on statin. LDL at target. Update non fasting labs today ( had a pack of nab crackers). Consider fibrate therapy in addition if TGS elevated or switching to higher potency statin.             Relevant Orders   Comprehensive metabolic panel   Hemoglobin A1c   Lipid panel       - Return to clinic in  3 months with sugar log/meter.  Annell Canty [facility] 11/25/2014 2:19 PM

## 2014-11-25 NOTE — Telephone Encounter (Signed)
Spoke with patient on the phone regarding Critical Labs per Dr. Howell Rucks.  See result note under CMP labs on 11/25/14 for details.  Patient expressed understanding regarding checking his blood sugars 3-4 times a day and bringing them to his next appointments.  Patient verbalized understanding and will continue to check his sugars.  He did state that this morning he took his medications and did not eat anything for breakfast.  His most recent blood sugar recheck at 430 was 145 which was his normal for this time of day per the patient.  Lavella Lemons RN

## 2014-11-25 NOTE — Assessment & Plan Note (Signed)
Symptoms tolerable most times. Discussed use of gabapentin at low dose in the night and risk profile and common side effects at last visit. He wishes to consider referral to neuropathy clinic instead and will let me know of those details, so that referral could be placed.

## 2014-11-25 NOTE — Patient Instructions (Signed)
Stop Atorvastatin and start Crestor after we give you a call and give the go ahead with this. (We need labs first).   Follow up in 3 months.

## 2014-11-25 NOTE — Assessment & Plan Note (Signed)
Last labs were c/w Stage 3 CKD and stable. Update today.

## 2014-11-25 NOTE — Telephone Encounter (Signed)
Please call pt to see that his sugars are improving.

## 2014-11-25 NOTE — Progress Notes (Signed)
   Subjective:    Patient ID: Jacob Buck, male    DOB: [birth year], 74 y.o.   MRN: [id number]  HPI  Mr. Fesler is a 74 yo male here for following up on his hypoglycemic episodes. He is accompanied by his daughter today.   1) Last Friday/Saturday when he had a hypoglycemic incident  Lasted 45 min intermittent where he drooled and slumped over. 2 huge pancakes for breakfast, blood sugar was not checked did not go to ER because he states he refused to go. Daughter was frustrated.  160's - 180's Did not bring logs today even though he is seeing Dr. Howell Rucks today.   Will wait to get his walker, they still have script daughter reports.   Failed simvastatin and atorvastatin due to side effects.     Review of Systems  Constitutional: Negative for fever, chills, diaphoresis and fatigue.  Eyes: Negative for visual disturbance.  Respiratory: Negative for chest tightness, shortness of breath and wheezing.   Cardiovascular: Negative for chest pain, palpitations and leg swelling.  Gastrointestinal: Negative for nausea, vomiting and diarrhea.  Endocrine: Negative for polydipsia, polyphagia and polyuria.  Skin: Negative for rash.  Neurological: Negative for dizziness, weakness and numbness.  Psychiatric/Behavioral: The patient is not nervous/anxious.       Objective:   Physical Exam  Constitutional: He is oriented to person, place, and time. He appears well-developed and well-nourished. No distress.  BP 116/64 mmHg  Pulse 73  Temp(Src) 97.7 F (36.5 C)  Resp 18  Ht 6\' 3"  (1.905 m)  Wt 253 lb 12.8 oz (115.123 kg)  BMI 31.72 kg/m2  SpO2 99%    HENT:  Head: Normocephalic and atraumatic.  Right Ear: External ear normal.  Left Ear: External ear normal.  Eyes: EOM are normal. Pupils are equal, round, and reactive to light. Right eye exhibits no discharge. Left eye exhibits no discharge. No scleral icterus.  Cardiovascular: Normal rate and regular rhythm.   Pulmonary/Chest: Effort normal  and breath sounds normal. No respiratory distress. He has no wheezes. He has no rales. He exhibits no tenderness.  Neurological: He is alert and oriented to person, place, and time.  Skin: Skin is warm and dry. No rash noted. He is not diaphoretic.      Assessment & Plan:

## 2014-11-25 NOTE — Assessment & Plan Note (Signed)
BP at target today on current regimen. Urine MA abnormal jan 2016.

## 2014-11-25 NOTE — Assessment & Plan Note (Signed)
Is on statin. LDL at target. Update non fasting labs today ( had a pack of nab crackers). Consider fibrate therapy in addition if TGS elevated or switching to higher potency statin.

## 2014-11-25 NOTE — Telephone Encounter (Signed)
Glucose:  47

## 2014-11-26 NOTE — Telephone Encounter (Signed)
Handled by Lavella Lemons, RN team lead.

## 2014-12-02 ENCOUNTER — Emergency Department
Admission: EM | Admit: 2014-12-02 | Discharge: 2014-12-02 | Disposition: A | Discharge status: Home / Self Care | Payer: Medicare Other | Attending: Emergency Medicine | Admitting: Emergency Medicine

## 2014-12-02 ENCOUNTER — Other Ambulatory Visit: Payer: Self-pay

## 2014-12-02 ENCOUNTER — Encounter: Payer: Self-pay | Admitting: Emergency Medicine

## 2014-12-02 DIAGNOSIS — N183 Chronic kidney disease, stage 3 (moderate): Secondary | ICD-10-CM | POA: Diagnosis not present

## 2014-12-02 DIAGNOSIS — E1122 Type 2 diabetes mellitus with diabetic chronic kidney disease: Secondary | ICD-10-CM | POA: Diagnosis not present

## 2014-12-02 DIAGNOSIS — R55 Syncope and collapse: Secondary | ICD-10-CM | POA: Insufficient documentation

## 2014-12-02 DIAGNOSIS — E1129 Type 2 diabetes mellitus with other diabetic kidney complication: Secondary | ICD-10-CM | POA: Diagnosis not present

## 2014-12-02 DIAGNOSIS — Z7982 Long term (current) use of aspirin: Secondary | ICD-10-CM | POA: Diagnosis not present

## 2014-12-02 DIAGNOSIS — Z793 Long term (current) use of hormonal contraceptives: Secondary | ICD-10-CM | POA: Insufficient documentation

## 2014-12-02 DIAGNOSIS — Z794 Long term (current) use of insulin: Secondary | ICD-10-CM | POA: Diagnosis not present

## 2014-12-02 DIAGNOSIS — E11649 Type 2 diabetes mellitus with hypoglycemia without coma: Secondary | ICD-10-CM | POA: Diagnosis present

## 2014-12-02 DIAGNOSIS — Z79899 Other long term (current) drug therapy: Secondary | ICD-10-CM | POA: Insufficient documentation

## 2014-12-02 DIAGNOSIS — I129 Hypertensive chronic kidney disease with stage 1 through stage 4 chronic kidney disease, or unspecified chronic kidney disease: Secondary | ICD-10-CM | POA: Diagnosis not present

## 2014-12-02 LAB — COMPREHENSIVE METABOLIC PANEL
ALT: 20 U/L (ref 17–63)
AST: 19 U/L (ref 15–41)
Albumin: 4 g/dL (ref 3.5–5.0)
Alkaline Phosphatase: 55 U/L (ref 38–126)
Anion gap: 8 (ref 5–15)
BILIRUBIN TOTAL: 0.7 mg/dL (ref 0.3–1.2)
BUN: 39 mg/dL — ABNORMAL HIGH (ref 6–20)
CALCIUM: 9.4 mg/dL (ref 8.9–10.3)
CO2: 26 mmol/L (ref 22–32)
CREATININE: 1.62 mg/dL — AB (ref 0.61–1.24)
Chloride: 108 mmol/L (ref 101–111)
GFR calc Af Amer: 47 mL/min — ABNORMAL LOW (ref >60)
GFR, EST NON AFRICAN AMERICAN: 40 mL/min — AB (ref >60)
Glucose, Bld: 98 mg/dL (ref 65–99)
Potassium: 4.5 mmol/L (ref 3.5–5.1)
Sodium: 142 mmol/L (ref 135–145)
Total Protein: 7.4 g/dL (ref 6.5–8.1)

## 2014-12-02 LAB — CBC
HCT: 34.8 % — ABNORMAL LOW (ref 40.0–52.0)
Hemoglobin: 12 g/dL — ABNORMAL LOW (ref 13.0–18.0)
MCH: 32.5 pg (ref 26.0–34.0)
MCHC: 34.4 g/dL (ref 32.0–36.0)
MCV: 94.5 fL (ref 80.0–100.0)
PLATELETS: 141 10*3/uL — AB (ref 150–440)
RBC: 3.69 MIL/uL — ABNORMAL LOW (ref 4.40–5.90)
RDW: 13.2 % (ref 11.5–14.5)
WBC: 10.5 10*3/uL (ref 3.8–10.6)

## 2014-12-02 LAB — TROPONIN I: Troponin I: 0.03 ng/mL (ref <0.031)

## 2014-12-02 NOTE — Discharge Instructions (Signed)
Near-Syncope Near-syncope (commonly known as near fainting) is sudden weakness, dizziness, or feeling like you might pass out. During an episode of near-syncope, you may also develop pale skin, have tunnel vision, or feel sick to your stomach (nauseous). Near-syncope may occur when getting up after sitting or while standing for a long time. It is caused by a sudden decrease in blood flow to the brain. This decrease can result from various causes or triggers, most of which are not serious. However, because near-syncope can sometimes be a sign of something serious, a medical evaluation is required. The specific cause is often not determined. HOME CARE INSTRUCTIONS  Monitor your condition for any changes. The following actions may help to alleviate any discomfort you are experiencing:  Have someone stay with you until you feel stable.  Lie down right away and prop your feet up if you start feeling like you might faint. Breathe deeply and steadily. Wait until all the symptoms have passed. Most of these episodes last only a few minutes. You may feel tired for several hours.   Drink enough fluids to keep your urine clear or pale yellow.   If you are taking blood pressure or heart medicine, get up slowly when seated or lying down. Take several minutes to sit and then stand. This can reduce dizziness.  Follow up with your health care provider as directed. SEEK IMMEDIATE MEDICAL CARE IF:   You have a severe headache.   You have unusual pain in the chest, abdomen, or back.   You are bleeding from the mouth or rectum, or you have black or tarry stool.   You have an irregular or very fast heartbeat.   You have repeated fainting or have seizure-like jerking during an episode.   You faint when sitting or lying down.   You have confusion.   You have difficulty walking.   You have severe weakness.   You have vision problems.  MAKE SURE YOU:   Understand these instructions.  Will  watch your condition.  Will get help right away if you are not doing well or get worse. Document Released: 06/20/2005 Document Revised: 06/25/2013 Document Reviewed: 11/23/2012 ExitCare Patient Information 2015 [employer]. This information is not intended to replace advice given to you by your health care provider. Make sure you discuss any questions you have with your health care provider.  

## 2014-12-02 NOTE — ED Notes (Signed)
Vital signs stable. 

## 2014-12-02 NOTE — ED Notes (Signed)
From home via [place], called out for ?hypogylcemia, CBG 126, was also hypotensive on EMS arrival, normotensive on ED arrival with no complaints, A/O X4 and in NAD

## 2014-12-02 NOTE — ED Provider Notes (Signed)
[facility] Emergency Department Provider Note  ____________________________________________  Time seen: On arrival  I have reviewed the triage vital signs and the nursing notes.   HISTORY  Chief Complaint Hypoglycemia      HPI Jacob Buck is a 74 y.o. male who presents after an episode of weakness. Patient reports that he uses a walker to ambulate and that he had to walk out to the water pump which was 72 feet away, twice in the heat. After the second time he started to feel lightheaded broke out in a sweat and felt like  he might faint. His daughter checked his sugar which was in the 160s, his heart rate was in the 80s but his blood pressure was noted to be low. By the time EMS got there he was feeling better. He denies chest pain he denies shortness of breath. Currently feels very well and would prefer to leave     Past Medical History  Diagnosis Date  . Endocrine problem 2008  . Arthritis 2008  . Diabetes mellitus without complication 5993  . Bowel trouble 2008  . Diverticulosis   . 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    Patient Active Problem List   Diagnosis Date Noted  . Encounter to establish care 10/01/2014  . Type 2 diabetes, uncontrolled, with renal manifestation 06/05/2014  . CKD stage 3 due to type 2 diabetes mellitus 06/05/2014  . Diabetic retinopathy 06/05/2014  . Diabetic neuropathy 06/05/2014  . Status post amputation of lesser toe of right foot 06/05/2014  . Hypercholesterolemia with hypertriglyceridemia 06/05/2014  . Essential hypertension, benign 06/05/2014  . History of colon cancer 06/05/2014  . Rectal bleeding 09/18/2013  . Personal history of colon cancer 10/02/2012    Past Surgical History  Procedure Laterality Date  . Colonoscopy  2003, 2014  . Foot surgery Right 2013  . Colon surgery  2008    carcinoid    Current Outpatient Rx  Name  Route  Sig  Dispense  Refill  . aspirin 81 MG tablet   Oral   Take 81  mg by mouth daily.         . carvedilol (COREG) 12.5 MG tablet   Oral   Take 1 tablet (12.5 mg total) by mouth 2 (two) times daily with a meal.   60 tablet   2   . insulin aspart (NOVOLOG FLEXPEN) 100 UNIT/ML FlexPen   Subcutaneous   Inject 20 Units into the skin 2 (two) times daily before a meal. Patient taking differently: Inject 22 Units into the skin 2 (two) times daily before a meal.    15 mL   4   . Insulin Detemir (LEVEMIR FLEXPEN) 100 UNIT/ML Pen   Subcutaneous   Inject 40 Units into the skin daily at 10 pm. Patient taking differently: Inject 42 Units into the skin at bedtime.    15 mL   4   . Insulin Pen Needle (BD PEN NEEDLE NANO U/F) 32G X 4 MM MISC      Use for insulin administration three times daily   100 each   11   . lisinopril (PRINIVIL,ZESTRIL) 40 MG tablet   Oral   Take 40 mg by mouth daily.          . rosuvastatin (CRESTOR) 5 MG tablet   Oral   Take 1 tablet (5 mg total) by mouth daily.   30 tablet   2   . Vitamin D, Ergocalciferol, (DRISDOL)  50000 UNITS CAPS capsule   Oral   Take 1 capsule (50,000 Units total) by mouth every 7 (seven) days. Patient not taking: Reported on 12/02/2014   30 capsule   1     Allergies Toujeo solostar  Family History  Problem Relation Age of Onset  . Diabetes Sister   . Alzheimer's disease Sister   . Diabetes Brother   . Heart disease Father     mild MI  . Alzheimer's disease Brother     Social History History  Substance Use Topics  . Smoking status: Never Smoker   . Smokeless tobacco: Never Used  . Alcohol Use: No    Review of Systems  Constitutional: Negative for fever. Eyes: Negative for visual changes. ENT: Negative for sore throat Cardiovascular: Negative for chest pain. Respiratory: Negative for shortness of breath. Gastrointestinal: Negative for abdominal pain, vomiting and diarrhea. Genitourinary: Negative for dysuria. Musculoskeletal: Negative for back pain. Skin: Negative for  rash. Neurological: Negative for headaches or focal weakness   10-point ROS otherwise negative.  ____________________________________________   PHYSICAL EXAM:  VITAL SIGNS: ED Triage Vitals  Enc Vitals Group     BP 12/02/14 1256 146/76 mmHg     Pulse Rate 12/02/14 1256 82     Resp 12/02/14 1256 16     Temp 12/02/14 1256 98 F (36.7 C)     Temp Source 12/02/14 1256 Oral     SpO2 12/02/14 1256 100 %     Weight 12/02/14 1256 256 lb (116.121 kg)     Height 12/02/14 1256 6\' 2"  (1.88 m)     Head Cir --      Peak Flow --      Pain Score --      Pain Loc --      Pain Edu? --      Excl. in [place]? --      Constitutional: Alert and oriented. Well appearing and in no distress. Eyes: Conjunctivae are normal. PERRL. ENT   Head: Normocephalic and atraumatic.   Nose: No rhinnorhea.   Mouth/Throat: Mucous membranes are moist. Cardiovascular: Normal rate, regular rhythm. Normal and symmetric distal pulses are present in all extremities. No murmurs, rubs, or gallops. Respiratory: Normal respiratory effort without tachypnea nor retractions. Breath sounds are clear and equal bilaterally.  Gastrointestinal: Soft and non-tender in all quadrants. No distention. There is no CVA tenderness. Genitourinary: deferred Musculoskeletal: Nontender with normal range of motion in all extremities. No lower extremity tenderness nor edema. Neurologic:  Normal speech and language. No gross focal neurologic deficits are appreciated. Skin:  Skin is warm, dry and intact. No rash noted. Psychiatric: Mood and affect are normal. Patient exhibits appropriate insight and judgment.  ____________________________________________    LABS (pertinent positives/negatives)  Labs Reviewed  CBC - Abnormal; Notable for the following:    RBC 3.69 (*)    Hemoglobin 12.0 (*)    HCT 34.8 (*)    Platelets 141 (*)    All other components within normal limits  COMPREHENSIVE METABOLIC PANEL  TROPONIN I     ____________________________________________   EKG  ED ECG REPORT I, Lavonia Drafts, the attending physician, personally viewed and interpreted this ECG.   Date: 12/02/2014  EKG Time: 1423  Rate: 76  Rhythm: normal sinus rhythm  Axis: Normal  Intervals:none  ST&T Change: Nonspecific   ____________________________________________    RADIOLOGY  None  ____________________________________________   PROCEDURES  Procedure(s) performed: none  Critical Care performed: none  ____________________________________________   INITIAL IMPRESSION /  ASSESSMENT AND PLAN / ED COURSE  Pertinent labs & imaging results that were available during my care of the patient were reviewed by me and considered in my medical decision making (see chart for details).  Patient well-appearing and in no distress. Sitting up in bed interacting with me easily. We'll check EKG and blood tests. If normal patient still feeling well we will discharge home with PCP follow-up  ____________________________________________   FINAL CLINICAL IMPRESSION(S) / ED DIAGNOSES  Final diagnoses:  Near syncope     Lavonia Drafts, MD 12/02/14 1505

## 2014-12-02 NOTE — ED Notes (Signed)
Sig pad not working, pt received instructions and paperwork, all questions asked

## 2014-12-09 ENCOUNTER — Encounter: Payer: Self-pay | Admitting: Nurse Practitioner

## 2014-12-09 DIAGNOSIS — R4689 Other symptoms and signs involving appearance and behavior: Secondary | ICD-10-CM | POA: Insufficient documentation

## 2014-12-09 NOTE — Assessment & Plan Note (Signed)
Patient is non-compliant with diet, medications, and information given to him by myself and Dr. Howell Rucks. He refuses to seek emergency care despite "slurred speech" and slumping over. His daughter is frustrated with his behavior.

## 2015-02-20 ENCOUNTER — Other Ambulatory Visit: Payer: Self-pay | Admitting: Family Medicine

## 2015-02-20 NOTE — Telephone Encounter (Signed)
Routing to provider  

## 2015-02-23 ENCOUNTER — Telehealth: Payer: Self-pay | Admitting: *Deleted

## 2015-02-23 ENCOUNTER — Other Ambulatory Visit: Payer: Self-pay | Admitting: Nurse Practitioner

## 2015-02-23 ENCOUNTER — Encounter: Payer: Self-pay | Admitting: Nurse Practitioner

## 2015-02-23 NOTE — Telephone Encounter (Signed)
Patent [id number]

## 2015-02-23 NOTE — Telephone Encounter (Signed)
Patient's daughter has requested a letter stated that she has been to her fathers appointments. Daughter's information to call [phone]. Thanks

## 2015-03-03 ENCOUNTER — Ambulatory Visit (INDEPENDENT_AMBULATORY_CARE_PROVIDER_SITE_OTHER): Payer: Medicare Other | Admitting: Nurse Practitioner

## 2015-03-03 ENCOUNTER — Ambulatory Visit: Payer: Medicare Other | Admitting: Endocrinology

## 2015-03-03 VITALS — BP 146/99 | HR 76 | Temp 98.3°F | Resp 18 | Ht 74.0 in | Wt 251.8 lb

## 2015-03-03 DIAGNOSIS — E1122 Type 2 diabetes mellitus with diabetic chronic kidney disease: Secondary | ICD-10-CM

## 2015-03-03 DIAGNOSIS — E113599 Type 2 diabetes mellitus with proliferative diabetic retinopathy without macular edema, unspecified eye: Secondary | ICD-10-CM

## 2015-03-03 DIAGNOSIS — E1142 Type 2 diabetes mellitus with diabetic polyneuropathy: Secondary | ICD-10-CM | POA: Diagnosis not present

## 2015-03-03 DIAGNOSIS — N183 Chronic kidney disease, stage 3 unspecified: Secondary | ICD-10-CM

## 2015-03-03 DIAGNOSIS — R4689 Other symptoms and signs involving appearance and behavior: Secondary | ICD-10-CM

## 2015-03-03 DIAGNOSIS — I10 Essential (primary) hypertension: Secondary | ICD-10-CM | POA: Diagnosis not present

## 2015-03-03 DIAGNOSIS — E11359 Type 2 diabetes mellitus with proliferative diabetic retinopathy without macular edema: Secondary | ICD-10-CM

## 2015-03-03 DIAGNOSIS — E782 Mixed hyperlipidemia: Secondary | ICD-10-CM

## 2015-03-03 LAB — COMPREHENSIVE METABOLIC PANEL
ALT: 15 U/L (ref 0–53)
AST: 14 U/L (ref 0–37)
Albumin: 4.1 g/dL (ref 3.5–5.2)
Alkaline Phosphatase: 59 U/L (ref 39–117)
BUN: 30 mg/dL — ABNORMAL HIGH (ref 6–23)
CALCIUM: 9.3 mg/dL (ref 8.4–10.5)
CHLORIDE: 102 meq/L (ref 96–112)
CO2: 28 meq/L (ref 19–32)
Creatinine, Ser: 1.51 mg/dL — ABNORMAL HIGH (ref 0.40–1.50)
GFR: 58.38 mL/min — AB (ref >60.00)
Glucose, Bld: 197 mg/dL — ABNORMAL HIGH (ref 70–99)
POTASSIUM: 4.5 meq/L (ref 3.5–5.1)
Sodium: 139 mEq/L (ref 135–145)
Total Bilirubin: 0.6 mg/dL (ref 0.2–1.2)
Total Protein: 7.1 g/dL (ref 6.0–8.3)

## 2015-03-03 LAB — CBC WITH DIFFERENTIAL/PLATELET
BASOS ABS: 0 10*3/uL (ref 0.0–0.1)
Basophils Relative: 0.4 % (ref 0.0–3.0)
EOS ABS: 0.3 10*3/uL (ref 0.0–0.7)
Eosinophils Relative: 2.8 % (ref 0.0–5.0)
HCT: 36.4 % — ABNORMAL LOW (ref 39.0–52.0)
Hemoglobin: 12 g/dL — ABNORMAL LOW (ref 13.0–17.0)
LYMPHS ABS: 2 10*3/uL (ref 0.7–4.0)
Lymphocytes Relative: 22 % (ref 12.0–46.0)
MCHC: 32.9 g/dL (ref 30.0–36.0)
MCV: 94.5 fl (ref 78.0–100.0)
Monocytes Absolute: 0.6 10*3/uL (ref 0.1–1.0)
Monocytes Relative: 6.1 % (ref 3.0–12.0)
Neutro Abs: 6.2 10*3/uL (ref 1.4–7.7)
Neutrophils Relative %: 68.7 % (ref 43.0–77.0)
PLATELETS: 172 10*3/uL (ref 150.0–400.0)
RBC: 3.86 Mil/uL — ABNORMAL LOW (ref 4.22–5.81)
RDW: 13.2 % (ref 11.5–15.5)
WBC: 9 10*3/uL (ref 4.0–10.5)

## 2015-03-03 LAB — LIPID PANEL
CHOL/HDL RATIO: 4
CHOLESTEROL: 141 mg/dL (ref 0–200)
HDL: 36.9 mg/dL — ABNORMAL LOW (ref >39.00)
NONHDL: 103.95
TRIGLYCERIDES: 207 mg/dL — AB (ref 0.0–149.0)
VLDL: 41.4 mg/dL — AB (ref 0.0–40.0)

## 2015-03-03 LAB — LDL CHOLESTEROL, DIRECT: Direct LDL: 68 mg/dL

## 2015-03-03 LAB — HEMOGLOBIN A1C: Hgb A1c MFr Bld: 7.7 % — ABNORMAL HIGH (ref 4.6–6.5)

## 2015-03-03 MED ORDER — TAMSULOSIN HCL 0.4 MG PO CAPS: 0.4000 mg | ORAL_CAPSULE | Freq: Every day | ORAL | Status: DC

## 2015-03-03 NOTE — Patient Instructions (Addendum)
Make your Eye appointment   Crestor at night

## 2015-03-03 NOTE — Progress Notes (Signed)
Patient ID: Jacob Buck, male    DOB: 1941-05-03  Age: 74 y.o. MRN: [id number]  CC: Follow-up   HPI Jacob Buck presents for follow up of DM and weak stream.   1) Flomax- "doesn't urinate much" Stream weak  2) Flu shot- would like today   3) Tongue feels thick on right side- slurred speech took insulin and was fine on Sunday  298 BS on that Sunday  Noticing slurring then had insulin from a church member who has the same insulin, reports using new needle (this is reported by daughter).   4) Food getting stuck in throat more often  Reports no heart burn, GERD  History Jacob Buck has a past medical history of Endocrine problem (2008); Arthritis (2008); Diabetes mellitus without complication (5093); Bowel trouble (2008); Diverticulosis; and Cancer (1991).   Jacob Buck has past surgical history that includes Colonoscopy (2003, 2014); Foot surgery (Right, 2013); and Colon surgery (2008).   His family history includes Alzheimer's disease in his brother and sister; Diabetes in his brother and sister; Heart disease in his father.Jacob Buck reports that Jacob Buck has quit smoking. Jacob Buck has never used smokeless tobacco. Jacob Buck reports that Jacob Buck does not drink alcohol or use illicit drugs.  Outpatient Prescriptions Prior to Visit  Medication Sig Dispense Refill  . aspirin 81 MG tablet Take 81 mg by mouth daily.    . carvedilol (COREG) 12.5 MG tablet Take 1 tablet (12.5 mg total) by mouth 2 (two) times daily with a meal. 60 tablet 2  . insulin aspart (NOVOLOG FLEXPEN) 100 UNIT/ML FlexPen Inject 20 Units into the skin 2 (two) times daily before a meal. (Patient taking differently: Inject 22 Units into the skin 2 (two) times daily before a meal. ) 15 mL 4  . Insulin Detemir (LEVEMIR FLEXPEN) 100 UNIT/ML Pen Inject 40 Units into the skin daily at 10 pm. (Patient taking differently: Inject 42 Units into the skin at bedtime. ) 15 mL 4  . Insulin Pen Needle (BD PEN NEEDLE NANO U/F) 32G X 4 MM MISC Use for insulin administration  three times daily 100 each 11  . lisinopril (PRINIVIL,ZESTRIL) 40 MG tablet TAKE ONE TABLET BY MOUTH ONCE DAILY 90 tablet 1  . rosuvastatin (CRESTOR) 5 MG tablet Take 1 tablet (5 mg total) by mouth daily. 30 tablet 2  . Vitamin D, Ergocalciferol, (DRISDOL) 50000 UNITS CAPS capsule Take 1 capsule (50,000 Units total) by mouth every 7 (seven) days. 30 capsule 1   No facility-administered medications prior to visit.    ROS Review of Systems  Constitutional: Negative for fever, chills, diaphoresis and fatigue.  HENT: Positive for trouble swallowing.        Thick tongue  Eyes: Negative for visual disturbance.  Respiratory: Negative for chest tightness, shortness of breath and wheezing.   Cardiovascular: Negative for chest pain, palpitations and leg swelling.  Gastrointestinal: Negative for nausea, vomiting, diarrhea and constipation.  Endocrine: Negative for polydipsia, polyphagia and polyuria.  Genitourinary: Positive for decreased urine volume and difficulty urinating.  Skin: Negative for rash.  Neurological: Negative for dizziness, weakness and numbness.  Psychiatric/Behavioral: The patient is not nervous/anxious.     Objective:  BP 146/99 mmHg  Pulse 76  Temp(Src) 98.3 F (36.8 C)  Resp 18  Ht 6\' 2"  (1.88 m)  Wt 251 lb 12.8 oz (114.216 kg)  BMI 32.32 kg/m2  SpO2 95%  Physical Exam  Constitutional: Jacob Buck is oriented to person, place, and time. Jacob Buck appears well-developed and well-nourished. No distress.  HENT:  Head: Normocephalic and atraumatic.  Right Ear: External ear normal.  Left Ear: External ear normal.  Mouth/Throat: No oropharyngeal exudate.  Tongue does not look different on right than left, able to stick out midline, uvula rises midline  Eyes: Right eye exhibits no discharge. Left eye exhibits no discharge. No scleral icterus.  Neck: Normal range of motion. Neck supple. No tracheal deviation present.  Cardiovascular: Normal rate and regular rhythm.   Pulmonary/Chest:  Effort normal and breath sounds normal. No respiratory distress. Jacob Buck has no wheezes. Jacob Buck has no rales. Jacob Buck exhibits no tenderness.  Musculoskeletal:  Cane for ambulation  Lymphadenopathy:    Jacob Buck has no cervical adenopathy.  Neurological: Jacob Buck is alert and oriented to person, place, and time.  Skin: Skin is warm and dry. No rash noted. Jacob Buck is not diaphoretic.  Psychiatric: Jacob Buck has a normal mood and affect. His behavior is normal. Judgment and thought content normal.   Assessment & Plan:   Jacob Buck was seen today for follow-up.  Diagnoses and all orders for this visit:  Diabetic polyneuropathy associated with type 2 diabetes mellitus -     Comprehensive metabolic panel -     Hemoglobin A1c -     Ambulatory referral to Endocrinology  Essential hypertension, benign -     Comprehensive metabolic panel -     CBC with Differential/Platelet  CKD stage 3 due to type 2 diabetes mellitus -     Comprehensive metabolic panel  Hypercholesterolemia with hypertriglyceridemia -     Lipid panel  Non-compliant behavior  Other orders -     tamsulosin (FLOMAX) 0.4 MG CAPS capsule; Take 1 capsule (0.4 mg total) by mouth daily. -     LDL cholesterol, direct   I am having Jacob Buck start on tamsulosin. I am also having him maintain his Insulin Detemir, insulin aspart, Insulin Pen Needle, aspirin, carvedilol, Vitamin D (Ergocalciferol), rosuvastatin, and lisinopril.  Meds ordered this encounter  Medications  . tamsulosin (FLOMAX) 0.4 MG CAPS capsule    Sig: Take 1 capsule (0.4 mg total) by mouth daily.    Dispense:  30 capsule    Refill:  1    Order Specific Question:  Supervising Provider    Answer:  Crecencio Mc [[id number]]   I personally spent 27 minutes face to face with > 50% spent on counseling about treatment options for urinary issues, medication counseling, discussing work up and reviewing records with pt and daughter.   Follow-up: Return in about 3 months (around 06/03/2015).

## 2015-03-03 NOTE — Progress Notes (Signed)
Pre visit review using our clinic review tool, if applicable. No additional management support is needed unless otherwise documented below in the visit note. 

## 2015-03-07 ENCOUNTER — Emergency Department
Admission: EM | Admit: 2015-03-07 | Discharge: 2015-03-07 | Disposition: A | Discharge status: Home / Self Care | Payer: Medicare Other | Attending: Emergency Medicine | Admitting: Emergency Medicine

## 2015-03-07 ENCOUNTER — Encounter: Payer: Self-pay | Admitting: Emergency Medicine

## 2015-03-07 ENCOUNTER — Emergency Department: Payer: Medicare Other

## 2015-03-07 ENCOUNTER — Other Ambulatory Visit: Payer: Self-pay

## 2015-03-07 DIAGNOSIS — I10 Essential (primary) hypertension: Secondary | ICD-10-CM

## 2015-03-07 DIAGNOSIS — E1140 Type 2 diabetes mellitus with diabetic neuropathy, unspecified: Secondary | ICD-10-CM | POA: Insufficient documentation

## 2015-03-07 DIAGNOSIS — E11319 Type 2 diabetes mellitus with unspecified diabetic retinopathy without macular edema: Secondary | ICD-10-CM | POA: Insufficient documentation

## 2015-03-07 DIAGNOSIS — E1122 Type 2 diabetes mellitus with diabetic chronic kidney disease: Secondary | ICD-10-CM | POA: Insufficient documentation

## 2015-03-07 DIAGNOSIS — Z794 Long term (current) use of insulin: Secondary | ICD-10-CM | POA: Diagnosis not present

## 2015-03-07 DIAGNOSIS — Z7982 Long term (current) use of aspirin: Secondary | ICD-10-CM | POA: Insufficient documentation

## 2015-03-07 DIAGNOSIS — I129 Hypertensive chronic kidney disease with stage 1 through stage 4 chronic kidney disease, or unspecified chronic kidney disease: Secondary | ICD-10-CM | POA: Insufficient documentation

## 2015-03-07 DIAGNOSIS — N183 Chronic kidney disease, stage 3 (moderate): Secondary | ICD-10-CM | POA: Diagnosis not present

## 2015-03-07 DIAGNOSIS — R1011 Right upper quadrant pain: Secondary | ICD-10-CM

## 2015-03-07 DIAGNOSIS — Z87891 Personal history of nicotine dependence: Secondary | ICD-10-CM | POA: Insufficient documentation

## 2015-03-07 DIAGNOSIS — Z79899 Other long term (current) drug therapy: Secondary | ICD-10-CM | POA: Insufficient documentation

## 2015-03-07 LAB — CBC WITH DIFFERENTIAL/PLATELET
BASOS PCT: 0 %
Basophils Absolute: 0 10*3/uL (ref 0–0.1)
EOS ABS: 0.1 10*3/uL (ref 0–0.7)
Eosinophils Relative: 1 %
HCT: 35.6 % — ABNORMAL LOW (ref 40.0–52.0)
Hemoglobin: 11.9 g/dL — ABNORMAL LOW (ref 13.0–18.0)
Lymphocytes Relative: 10 %
Lymphs Abs: 1.3 10*3/uL (ref 1.0–3.6)
MCH: 31.4 pg (ref 26.0–34.0)
MCHC: 33.5 g/dL (ref 32.0–36.0)
MCV: 93.8 fL (ref 80.0–100.0)
MONO ABS: 0.8 10*3/uL (ref 0.2–1.0)
MONOS PCT: 6 %
NEUTROS ABS: 11.6 10*3/uL — AB (ref 1.4–6.5)
Neutrophils Relative %: 83 %
PLATELETS: 161 10*3/uL (ref 150–440)
RBC: 3.8 MIL/uL — ABNORMAL LOW (ref 4.40–5.90)
RDW: 12.7 % (ref 11.5–14.5)
WBC: 13.9 10*3/uL — ABNORMAL HIGH (ref 3.8–10.6)

## 2015-03-07 LAB — LIPASE, BLOOD: Lipase: 10 U/L — ABNORMAL LOW (ref 22–51)

## 2015-03-07 LAB — COMPREHENSIVE METABOLIC PANEL
ALT: 17 U/L (ref 17–63)
ANION GAP: 9 (ref 5–15)
AST: 22 U/L (ref 15–41)
Albumin: 3.9 g/dL (ref 3.5–5.0)
Alkaline Phosphatase: 58 U/L (ref 38–126)
BILIRUBIN TOTAL: 0.8 mg/dL (ref 0.3–1.2)
BUN: 25 mg/dL — AB (ref 6–20)
CO2: 28 mmol/L (ref 22–32)
Calcium: 9.2 mg/dL (ref 8.9–10.3)
Chloride: 98 mmol/L — ABNORMAL LOW (ref 101–111)
Creatinine, Ser: 1.46 mg/dL — ABNORMAL HIGH (ref 0.61–1.24)
GFR calc Af Amer: 53 mL/min — ABNORMAL LOW (ref >60)
GFR, EST NON AFRICAN AMERICAN: 46 mL/min — AB (ref >60)
Glucose, Bld: 252 mg/dL — ABNORMAL HIGH (ref 65–99)
POTASSIUM: 4.2 mmol/L (ref 3.5–5.1)
Sodium: 135 mmol/L (ref 135–145)
TOTAL PROTEIN: 7.5 g/dL (ref 6.5–8.1)

## 2015-03-07 MED ORDER — LABETALOL HCL 5 MG/ML IV SOLN
10.0000 mg | Freq: Once | INTRAVENOUS | Status: AC
  Administered 2015-03-07: 10 mg via INTRAVENOUS
  Filled 2015-03-07: qty 4

## 2015-03-07 NOTE — ED Provider Notes (Signed)
[facility] Emergency Department Provider Note  ____________________________________________  Time seen: Approximately 5:11 PM  I have reviewed the triage vital signs and the nursing notes.   HISTORY  Chief Complaint Hypertension    HPI Jacob Buck is a 74 y.o. male patient reports he took his lisinopril and carvedilol this morning but his blood pressure still very high. He reports last night and early this morning he had pain in the right side of his abdomen up high was fairly severe in nature but it went away spontaneously he cannot think of anything he did or didn't do that would make it better or worse and just says it was a pain. The pain resolved spontaneously and is not there at all now. Patient has not had it before. Patient has no complaints associated with his blood pressure has no blurry vision and headache muscle aches chest pain short of shortness of breath or anything else.   Past Medical History  Diagnosis Date  . Endocrine problem 2008  . Arthritis 2008  . Diabetes mellitus without complication 7494  . Bowel trouble 2008  . Diverticulosis   . 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    Patient Active Problem List   Diagnosis Date Noted  . Non-compliant behavior 12/09/2014  . Encounter to establish care 10/01/2014  . Type 2 diabetes, uncontrolled, with renal manifestation 06/05/2014  . CKD stage 3 due to type 2 diabetes mellitus 06/05/2014  . Diabetic retinopathy 06/05/2014  . Diabetic neuropathy 06/05/2014  . Status post amputation of lesser toe of right foot 06/05/2014  . Hypercholesterolemia with hypertriglyceridemia 06/05/2014  . Essential hypertension, benign 06/05/2014  . History of colon cancer 06/05/2014  . Rectal bleeding 09/18/2013  . Personal history of colon cancer 10/02/2012    Past Surgical History  Procedure Laterality Date  . Colonoscopy  2003, 2014  . Foot surgery Right 2013  . Colon surgery  2008    carcinoid     Current Outpatient Rx  Name  Route  Sig  Dispense  Refill  . aspirin 81 MG tablet   Oral   Take 81 mg by mouth daily.         . carvedilol (COREG) 12.5 MG tablet   Oral   Take 1 tablet (12.5 mg total) by mouth 2 (two) times daily with a meal.   60 tablet   2   . insulin aspart (NOVOLOG FLEXPEN) 100 UNIT/ML FlexPen   Subcutaneous   Inject 20 Units into the skin 2 (two) times daily before a meal. Patient taking differently: Inject 22 Units into the skin 2 (two) times daily before a meal.    15 mL   4   . Insulin Detemir (LEVEMIR FLEXPEN) 100 UNIT/ML Pen   Subcutaneous   Inject 40 Units into the skin daily at 10 pm. Patient taking differently: Inject 42 Units into the skin at bedtime.    15 mL   4   . Insulin Pen Needle (BD PEN NEEDLE NANO U/F) 32G X 4 MM MISC      Use for insulin administration three times daily   100 each   11   . lisinopril (PRINIVIL,ZESTRIL) 40 MG tablet      TAKE ONE TABLET BY MOUTH ONCE DAILY   90 tablet   1   . rosuvastatin (CRESTOR) 5 MG tablet   Oral   Take 1 tablet (5 mg total) by mouth daily.   30 tablet   2   .  tamsulosin (FLOMAX) 0.4 MG CAPS capsule   Oral   Take 1 capsule (0.4 mg total) by mouth daily.   30 capsule   1   . Vitamin D, Ergocalciferol, (DRISDOL) 50000 UNITS CAPS capsule   Oral   Take 1 capsule (50,000 Units total) by mouth every 7 (seven) days.   30 capsule   1     Allergies Toujeo solostar  Family History  Problem Relation Age of Onset  . Diabetes Sister   . Alzheimer's disease Sister   . Diabetes Brother   . Heart disease Father     mild MI  . Alzheimer's disease Brother     Social History Social History  Substance Use Topics  . Smoking status: Former Research scientist (life sciences)  . Smokeless tobacco: Never Used  . Alcohol Use: No    Review of Systems Constitutional: No fever/chills Eyes: No visual changes. ENT: No sore throat. Cardiovascular: Denies chest pain. Respiratory: Denies shortness of  breath. Gastrointestinal: No abdominal pain.  No nausea, no vomiting.  No diarrhea.  No constipation. Genitourinary: Negative for dysuria. Musculoskeletal: Negative for back pain. Skin: Negative for rash. Neurological: Negative for headaches, focal weakness or numbness.  10-point ROS otherwise negative.  ____________________________________________   PHYSICAL EXAM:  VITAL SIGNS: ED Triage Vitals  Enc Vitals Group     BP 03/07/15 1648 206/102 mmHg     Pulse Rate 03/07/15 1648 92     Resp 03/07/15 1648 20     Temp 03/07/15 1648 97.8 F (36.6 C)     Temp Source 03/07/15 1648 Oral     SpO2 03/07/15 1648 99 %     Weight 03/07/15 1648 251 lb (113.853 kg)     Height 03/07/15 1648 6\' 2"  (1.88 m)     Head Cir --      Peak Flow --      Pain Score 03/07/15 1649 0     Pain Loc --      Pain Edu? --      Excl. in [place]? --     Constitutional: Alert and oriented. Well appearing and in no acute distress. Eyes: Conjunctivae are normal. PERRL. EOMI. Head: Atraumatic. Nose: No congestion/rhinnorhea. Mouth/Throat: Mucous membranes are moist.  Oropharynx non-erythematous. Neck: No stridor.  Cardiovascular: Normal rate, regular rhythm. Grossly normal heart sounds.  Good peripheral circulation. Respiratory: Normal respiratory effort.  No retractions. Lungs CTAB. Gastrointestinal: Soft and nontender. No distention. No abdominal bruits. No CVA tenderness. Musculoskeletal: No lower extremity tenderness nor edema.  No joint effusions. Neurologic:  Normal speech and language. No gross focal neurologic deficits are appreciated. No gait instability. Skin:  Skin is warm, dry and intact. No rash noted. Psychiatric: Mood and affect are normal. Speech and behavior are normal.  ____________________________________________   LABS (all labs ordered are listed, but only abnormal results are displayed)  Labs Reviewed  COMPREHENSIVE METABOLIC PANEL - Abnormal; Notable for the following:    Chloride 98  (*)    Glucose, Bld 252 (*)    BUN 25 (*)    Creatinine, Ser 1.46 (*)    GFR calc non Af Amer 46 (*)    GFR calc Af Amer 53 (*)    All other components within normal limits  LIPASE, BLOOD - Abnormal; Notable for the following:    Lipase 10 (*)    All other components within normal limits  CBC WITH DIFFERENTIAL/PLATELET - Abnormal; Notable for the following:    WBC 13.9 (*)    RBC 3.80 (*)  Hemoglobin 11.9 (*)    HCT 35.6 (*)    Neutro Abs 11.6 (*)    All other components within normal limits   ____________________________________________  EKG  ______________________________________  RADIOLOGY   ____________________________________________   PROCEDURES    ____________________________________________   INITIAL IMPRESSION / ASSESSMENT AND PLAN / ED COURSE  Pertinent labs & imaging results that were available during my care of the patient were reviewed by me and considered in my medical decision making (see chart for details).   ____________________________________________   FINAL CLINICAL IMPRESSION(S) / ED DIAGNOSES  Final diagnoses:  Essential hypertension      Nena Polio, MD 03/08/15 [phone]

## 2015-03-07 NOTE — ED Notes (Signed)
Took blood pressure at home , 202/122, no other symptoms verbalized

## 2015-03-07 NOTE — ED Notes (Signed)
Pt checked BP at home 202/122. States he takes lisinopril and carvedilol for hypertension.

## 2015-03-11 ENCOUNTER — Telehealth: Payer: Self-pay | Admitting: Nurse Practitioner

## 2015-03-11 ENCOUNTER — Other Ambulatory Visit: Payer: Self-pay | Admitting: Nurse Practitioner

## 2015-03-11 DIAGNOSIS — R39198 Other difficulties with micturition: Secondary | ICD-10-CM

## 2015-03-11 NOTE — Telephone Encounter (Signed)
Spoke with daughter on phone:  Pt is normotensive as of yesterday (couldn't repeat number back to me, but states "it was normal")  He has urinated "20 times", but it is slow. Daughter reports father "thinks he took his flomax". He reports ankle swelling since Sunday.  Daughter reports she called Nephrology office and they apparently told her to contact us to get pt in with Urology.  Referral placed for pt to see urology.

## 2015-03-11 NOTE — Telephone Encounter (Signed)
Pt Renee daughter called about Mr Voth feet swollen he went to the ER on Saturday and his BP was 209/122. Daughter is calling to let Lorane Gell know. Thank You!

## 2015-03-12 ENCOUNTER — Encounter: Payer: Self-pay | Admitting: Emergency Medicine

## 2015-03-12 ENCOUNTER — Emergency Department
Admission: EM | Admit: 2015-03-12 | Discharge: 2015-03-12 | Disposition: A | Discharge status: Home / Self Care | Payer: Medicare Other | Attending: Emergency Medicine | Admitting: Emergency Medicine

## 2015-03-12 DIAGNOSIS — Z79899 Other long term (current) drug therapy: Secondary | ICD-10-CM | POA: Insufficient documentation

## 2015-03-12 DIAGNOSIS — I129 Hypertensive chronic kidney disease with stage 1 through stage 4 chronic kidney disease, or unspecified chronic kidney disease: Secondary | ICD-10-CM | POA: Diagnosis not present

## 2015-03-12 DIAGNOSIS — E11319 Type 2 diabetes mellitus with unspecified diabetic retinopathy without macular edema: Secondary | ICD-10-CM | POA: Insufficient documentation

## 2015-03-12 DIAGNOSIS — Z79811 Long term (current) use of aromatase inhibitors: Secondary | ICD-10-CM | POA: Diagnosis not present

## 2015-03-12 DIAGNOSIS — Z87891 Personal history of nicotine dependence: Secondary | ICD-10-CM | POA: Diagnosis not present

## 2015-03-12 DIAGNOSIS — N3090 Cystitis, unspecified without hematuria: Secondary | ICD-10-CM | POA: Diagnosis not present

## 2015-03-12 DIAGNOSIS — E1140 Type 2 diabetes mellitus with diabetic neuropathy, unspecified: Secondary | ICD-10-CM | POA: Diagnosis not present

## 2015-03-12 DIAGNOSIS — R339 Retention of urine, unspecified: Secondary | ICD-10-CM

## 2015-03-12 DIAGNOSIS — R1909 Other intra-abdominal and pelvic swelling, mass and lump: Secondary | ICD-10-CM | POA: Diagnosis present

## 2015-03-12 DIAGNOSIS — Z794 Long term (current) use of insulin: Secondary | ICD-10-CM | POA: Insufficient documentation

## 2015-03-12 DIAGNOSIS — Z7982 Long term (current) use of aspirin: Secondary | ICD-10-CM | POA: Diagnosis not present

## 2015-03-12 DIAGNOSIS — E1122 Type 2 diabetes mellitus with diabetic chronic kidney disease: Secondary | ICD-10-CM | POA: Diagnosis not present

## 2015-03-12 DIAGNOSIS — N183 Chronic kidney disease, stage 3 (moderate): Secondary | ICD-10-CM | POA: Insufficient documentation

## 2015-03-12 LAB — URINALYSIS COMPLETE WITH MICROSCOPIC (ARMC ONLY)
BILIRUBIN URINE: NEGATIVE
Hgb urine dipstick: NEGATIVE
Ketones, ur: NEGATIVE mg/dL
NITRITE: NEGATIVE
PH: 5 (ref 5.0–8.0)
Protein, ur: 100 mg/dL — AB
SPECIFIC GRAVITY, URINE: 1.014 (ref 1.005–1.030)
Squamous Epithelial / LPF: NONE SEEN

## 2015-03-12 LAB — GLUCOSE, CAPILLARY: Glucose-Capillary: 266 mg/dL — ABNORMAL HIGH (ref 65–99)

## 2015-03-12 MED ORDER — SULFAMETHOXAZOLE-TRIMETHOPRIM 800-160 MG PO TABS: 1.0000 | ORAL_TABLET | Freq: Two times a day (BID) | ORAL | Status: DC

## 2015-03-12 NOTE — ED Notes (Signed)
Bladder scan > 1 liter.

## 2015-03-12 NOTE — Discharge Instructions (Signed)
Acute Urinary Retention °Acute urinary retention is the temporary inability to urinate. °This is a common problem in older men. As men age their prostates become larger and block the flow of urine from the bladder. This is usually a problem that has come on gradually.  °HOME CARE INSTRUCTIONS °If you are sent home with a Foley catheter and a drainage system, you will need to discuss the best course of action with your health care provider. While the catheter is in, maintain a good intake of fluids. Keep the drainage bag emptied and lower than your catheter. This is so that contaminated urine will not flow back into your bladder, which could lead to a urinary tract infection. °There are two main types of drainage bags. One is a large bag that usually is used at night. It has a good capacity that will allow you to sleep through the night without having to empty it. The second type is called a leg bag. It has a smaller capacity, so it needs to be emptied more frequently. However, the main advantage is that it can be attached by a leg strap and can go underneath your clothing, allowing you the freedom to move about or leave your home. °Only take over-the-counter or prescription medicines for pain, discomfort, or fever as directed by your health care provider.  °SEEK MEDICAL CARE IF: °· You develop a low-grade fever. °· You experience spasms or leakage of urine with the spasms. °SEEK IMMEDIATE MEDICAL CARE IF:  °· You develop chills or fever. °· Your catheter stops draining urine. °· Your catheter falls out. °· You start to develop increased bleeding that does not respond to rest and increased fluid intake. °MAKE SURE YOU: °· Understand these instructions. °· Will watch your condition. °· Will get help right away if you are not doing well or get worse. °Document Released: 09/26/2000 Document Revised: 06/25/2013 Document Reviewed: 11/29/2012 °[employer]® Patient Information ©2015 [employer]. This information is not  intended to replace advice given to you by your health care provider. Make sure you discuss any questions you have with your health care provider. ° °

## 2015-03-12 NOTE — ED Provider Notes (Signed)
[facility] Emergency Department Provider Note  ____________________________________________  Time seen: 10:30 AM  I have reviewed the triage vital signs and the nursing notes.   HISTORY  Chief Complaint Groin Swelling    HPI Jacob Buck is a 74 y.o. male who complains of lower abdominal pain and pressure for 24 hours. He notes that he was in the ED a few days ago with lower abdominal pain but evaluation was unremarkable and he was discharged home. Yesterday he noticed that he was unable to pass his urine. Denies any fever chills nausea vomiting diarrhea chest pain or shortness of breath. Otherwise felt fine but just did not have the ability to urinate with worsening discomfort in the pelvic area. If this is happened once before and that his urologist had to leave a urinary catheter in for a short period of time.     Past Medical History  Diagnosis Date  . Endocrine problem 2008  . Arthritis 2008  . Diabetes mellitus without complication 2774  . Bowel trouble 2008  . Diverticulosis   . 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     Patient Active Problem List   Diagnosis Date Noted  . Non-compliant behavior 12/09/2014  . Encounter to establish care 10/01/2014  . Type 2 diabetes, uncontrolled, with renal manifestation 06/05/2014  . CKD stage 3 due to type 2 diabetes mellitus 06/05/2014  . Diabetic retinopathy 06/05/2014  . Diabetic neuropathy 06/05/2014  . Status post amputation of lesser toe of right foot 06/05/2014  . Hypercholesterolemia with hypertriglyceridemia 06/05/2014  . Essential hypertension, benign 06/05/2014  . History of colon cancer 06/05/2014  . Rectal bleeding 09/18/2013  . Personal history of colon cancer 10/02/2012     Past Surgical History  Procedure Laterality Date  . Colonoscopy  2003, 2014  . Foot surgery Right 2013  . Colon surgery  2008    carcinoid     Current Outpatient Rx  Name  Route  Sig  Dispense  Refill   . aspirin 81 MG tablet   Oral   Take 81 mg by mouth daily.         . carvedilol (COREG) 12.5 MG tablet   Oral   Take 1 tablet (12.5 mg total) by mouth 2 (two) times daily with a meal.   60 tablet   2   . insulin aspart (NOVOLOG FLEXPEN) 100 UNIT/ML FlexPen   Subcutaneous   Inject 20 Units into the skin 2 (two) times daily before a meal. Patient taking differently: Inject 22 Units into the skin 2 (two) times daily before a meal.    15 mL   4   . Insulin Detemir (LEVEMIR FLEXPEN) 100 UNIT/ML Pen   Subcutaneous   Inject 40 Units into the skin daily at 10 pm. Patient taking differently: Inject 42 Units into the skin at bedtime.    15 mL   4   . Insulin Pen Needle (BD PEN NEEDLE NANO U/F) 32G X 4 MM MISC      Use for insulin administration three times daily   100 each   11   . lisinopril (PRINIVIL,ZESTRIL) 40 MG tablet      TAKE ONE TABLET BY MOUTH ONCE DAILY   90 tablet   1   . rosuvastatin (CRESTOR) 5 MG tablet   Oral   Take 1 tablet (5 mg total) by mouth daily.   30 tablet   2   . sulfamethoxazole-trimethoprim (BACTRIM DS) 800-160 MG  per tablet   Oral   Take 1 tablet by mouth 2 (two) times daily.   14 tablet   0   . tamsulosin (FLOMAX) 0.4 MG CAPS capsule   Oral   Take 1 capsule (0.4 mg total) by mouth daily.   30 capsule   1   . Vitamin D, Ergocalciferol, (DRISDOL) 50000 UNITS CAPS capsule   Oral   Take 1 capsule (50,000 Units total) by mouth every 7 (seven) days.   30 capsule   1      Allergies Toujeo solostar   Family History  Problem Relation Age of Onset  . Diabetes Sister   . Alzheimer's disease Sister   . Diabetes Brother   . Heart disease Father     mild MI  . Alzheimer's disease Brother     Social History Social History  Substance Use Topics  . Smoking status: Former Research scientist (life sciences)  . Smokeless tobacco: Never Used  . Alcohol Use: No    Review of Systems  Constitutional:   No fever or chills. No weight changes Eyes:   No blurry  vision or double vision.  ENT:   No sore throat. Cardiovascular:   No chest pain. Respiratory:   No dyspnea or cough. Gastrointestinal:   Positive lower abdominal pain without vomiting and diarrhea.  No BRBPR or melena. Genitourinary:   No dysuria or hematuria. Positive urinary retention Musculoskeletal:   Negative for back pain. No joint swelling or pain. Skin:   Negative for rash. Neurological:   Negative for headaches, focal weakness or numbness. Psychiatric:  No anxiety or depression.   Endocrine:  No hot/cold intolerance, changes in energy, or sleep difficulty.  10-point ROS otherwise negative.  ____________________________________________   PHYSICAL EXAM:  VITAL SIGNS: ED Triage Vitals  Enc Vitals Group     BP 03/12/15 0717 191/141 mmHg     Pulse Rate 03/12/15 0717 102     Resp 03/12/15 0717 20     Temp 03/12/15 0717 97.9 F (36.6 C)     Temp Source 03/12/15 0717 Oral     SpO2 03/12/15 0717 99 %     Weight 03/12/15 0717 251 lb (113.853 kg)     Height 03/12/15 0717 6\' 2"  (1.88 m)     Head Cir --      Peak Flow --      Pain Score --      Pain Loc --      Pain Edu? --      Excl. in [place]? --    Bladder scan revealed greater than 1 L urine. Foley catheter placed prior to arrival in treatment room with output of 1200 ML's of urine.  Constitutional:   Alert and oriented. Well appearing and in no distress. Eyes:   No scleral icterus. No conjunctival pallor. PERRL. EOMI ENT   Head:   Normocephalic and atraumatic.   Nose:   No congestion/rhinnorhea. No septal hematoma   Mouth/Throat:   MMM, no pharyngeal erythema. No peritonsillar mass. No uvula shift.   Neck:   No stridor. No SubQ emphysema. No meningismus. Hematological/Lymphatic/Immunilogical:   No cervical lymphadenopathy. Cardiovascular:   RRR. Normal and symmetric distal pulses are present in all extremities. No murmurs, rubs, or gallops. Respiratory:   Normal respiratory effort without tachypnea nor  retractions. Breath sounds are clear and equal bilaterally. No wheezes/rales/rhonchi. Gastrointestinal:   Soft and nontender. No distention. There is no CVA tenderness.  No rebound, rigidity, or guarding. Genitourinary:   Unremarkable. Foley catheter  in place Musculoskeletal:   Nontender with normal range of motion in all extremities. No joint effusions.  No lower extremity tenderness.  No edema. Neurologic:   Normal speech and language.  CN 2-10 normal. Motor grossly intact. No pronator drift.  Normal gait. No gross focal neurologic deficits are appreciated.  Skin:    Skin is warm, dry and intact. No rash noted.  No petechiae, purpura, or bullae. Psychiatric:   Mood and affect are normal. Speech and behavior are normal. Patient exhibits appropriate insight and judgment.  ____________________________________________    LABS (pertinent positives/negatives) (all labs ordered are listed, but only abnormal results are displayed) Labs Reviewed  URINALYSIS COMPLETEWITH MICROSCOPIC (Tyler) - Abnormal; Notable for the following:    Color, Urine YELLOW (*)    APPearance HAZY (*)    Glucose, UA >500 (*)    Protein, ur 100 (*)    Leukocytes, UA TRACE (*)    Bacteria, UA RARE (*)    All other components within normal limits  GLUCOSE, CAPILLARY - Abnormal; Notable for the following:    Glucose-Capillary 266 (*)    All other components within normal limits  URINE CULTURE   urine white blood cell too numerous to count ____________________________________________   EKG    ____________________________________________    RADIOLOGY    ____________________________________________   PROCEDURES   ____________________________________________   INITIAL IMPRESSION / ASSESSMENT AND PLAN / ED COURSE  Pertinent labs & imaging results that were available during my care of the patient were reviewed by me and considered in my medical decision making (see chart for details).  Patient  presents with acute urinary retention, likely due to his history of prostatic hypertrophy. He already has an appointment with [facility] that was set up by his primary care doctor. We'll keep the Foley catheter in place and provide the patient a leg bag until then. He also has a urinary tract infection, so we'll start him on Bactrim and send a urine culture. No evidence of pyelonephritis or sepsis, low suspicion for acute renal failure.     ____________________________________________   FINAL CLINICAL IMPRESSION(S) / ED DIAGNOSES  Final diagnoses:  Urinary retention  Cystitis      Carrie Mew, MD 03/12/15 1116

## 2015-03-12 NOTE — ED Notes (Signed)
Patient to ED with report of unable to urinate, feels swollen and painful around groin area. Reports being here Saturday for essentially the same thing.

## 2015-03-16 ENCOUNTER — Emergency Department
Admission: EM | Admit: 2015-03-16 | Discharge: 2015-03-16 | Disposition: A | Discharge status: Home / Self Care | Payer: Medicare Other | Attending: Emergency Medicine | Admitting: Emergency Medicine

## 2015-03-16 ENCOUNTER — Encounter: Payer: Self-pay | Admitting: Emergency Medicine

## 2015-03-16 DIAGNOSIS — Z794 Long term (current) use of insulin: Secondary | ICD-10-CM | POA: Insufficient documentation

## 2015-03-16 DIAGNOSIS — Z792 Long term (current) use of antibiotics: Secondary | ICD-10-CM | POA: Insufficient documentation

## 2015-03-16 DIAGNOSIS — I129 Hypertensive chronic kidney disease with stage 1 through stage 4 chronic kidney disease, or unspecified chronic kidney disease: Secondary | ICD-10-CM | POA: Diagnosis not present

## 2015-03-16 DIAGNOSIS — Z87891 Personal history of nicotine dependence: Secondary | ICD-10-CM | POA: Insufficient documentation

## 2015-03-16 DIAGNOSIS — Z87898 Personal history of other specified conditions: Secondary | ICD-10-CM

## 2015-03-16 DIAGNOSIS — E1122 Type 2 diabetes mellitus with diabetic chronic kidney disease: Secondary | ICD-10-CM | POA: Insufficient documentation

## 2015-03-16 DIAGNOSIS — Z7982 Long term (current) use of aspirin: Secondary | ICD-10-CM | POA: Insufficient documentation

## 2015-03-16 DIAGNOSIS — R339 Retention of urine, unspecified: Secondary | ICD-10-CM | POA: Diagnosis present

## 2015-03-16 DIAGNOSIS — Z79899 Other long term (current) drug therapy: Secondary | ICD-10-CM | POA: Insufficient documentation

## 2015-03-16 DIAGNOSIS — N183 Chronic kidney disease, stage 3 (moderate): Secondary | ICD-10-CM | POA: Insufficient documentation

## 2015-03-16 NOTE — ED Notes (Signed)
Says we put a cath in herer on 9/3 and he needs it checked.  Has appt with urology next week, and pcp did not answer today.  First said he wanted it out.  But he said there is some blood around it this am.

## 2015-03-16 NOTE — ED Notes (Signed)
States he had a foley cath placed from urinary retention   It is uncomfortable and wants it taken out. Foley cath removed w./o diff

## 2015-03-16 NOTE — ED Provider Notes (Signed)
[facility] Emergency Department Provider Note  ____________________________________________  Time seen: Approximately 1:29 PM  I have reviewed the triage vital signs and the nursing notes.   HISTORY  Chief Complaint Urinary Retention    HPI Jacob Buck is a 74 y.o. male who presents with painful urinary catheter. Patient desires to have it removed today and see what happens. Patient states been on antibiotics and feels like it's making water adequately. Denies any other complaints at this time   Past Medical History  Diagnosis Date  . Endocrine problem 2008  . Arthritis 2008  . Diabetes mellitus without complication 4270  . Bowel trouble 2008  . Diverticulosis   . 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    Patient Active Problem List   Diagnosis Date Noted  . Non-compliant behavior 12/09/2014  . Encounter to establish care 10/01/2014  . Type 2 diabetes, uncontrolled, with renal manifestation 06/05/2014  . CKD stage 3 due to type 2 diabetes mellitus 06/05/2014  . Diabetic retinopathy 06/05/2014  . Diabetic neuropathy 06/05/2014  . Status post amputation of lesser toe of right foot 06/05/2014  . Hypercholesterolemia with hypertriglyceridemia 06/05/2014  . Essential hypertension, benign 06/05/2014  . History of colon cancer 06/05/2014  . Rectal bleeding 09/18/2013  . Personal history of colon cancer 10/02/2012    Past Surgical History  Procedure Laterality Date  . Colonoscopy  2003, 2014  . Foot surgery Right 2013  . Colon surgery  2008    carcinoid    Current Outpatient Rx  Name  Route  Sig  Dispense  Refill  . aspirin 81 MG tablet   Oral   Take 81 mg by mouth daily.         . carvedilol (COREG) 12.5 MG tablet   Oral   Take 1 tablet (12.5 mg total) by mouth 2 (two) times daily with a meal.   60 tablet   2   . insulin aspart (NOVOLOG FLEXPEN) 100 UNIT/ML FlexPen   Subcutaneous   Inject 20 Units into the skin 2 (two) times  daily before a meal. Patient taking differently: Inject 22 Units into the skin 2 (two) times daily before a meal.    15 mL   4   . Insulin Detemir (LEVEMIR FLEXPEN) 100 UNIT/ML Pen   Subcutaneous   Inject 40 Units into the skin daily at 10 pm. Patient taking differently: Inject 42 Units into the skin at bedtime.    15 mL   4   . Insulin Pen Needle (BD PEN NEEDLE NANO U/F) 32G X 4 MM MISC      Use for insulin administration three times daily   100 each   11   . lisinopril (PRINIVIL,ZESTRIL) 40 MG tablet      TAKE ONE TABLET BY MOUTH ONCE DAILY   90 tablet   1   . rosuvastatin (CRESTOR) 5 MG tablet   Oral   Take 1 tablet (5 mg total) by mouth daily.   30 tablet   2   . sulfamethoxazole-trimethoprim (BACTRIM DS) 800-160 MG per tablet   Oral   Take 1 tablet by mouth 2 (two) times daily.   14 tablet   0   . tamsulosin (FLOMAX) 0.4 MG CAPS capsule   Oral   Take 1 capsule (0.4 mg total) by mouth daily.   30 capsule   1   . Vitamin D, Ergocalciferol, (DRISDOL) 50000 UNITS CAPS capsule   Oral   Take  1 capsule (50,000 Units total) by mouth every 7 (seven) days.   30 capsule   1     Allergies Toujeo solostar  Family History  Problem Relation Age of Onset  . Diabetes Sister   . Alzheimer's disease Sister   . Diabetes Brother   . Heart disease Father     mild MI  . Alzheimer's disease Brother     Social History Social History  Substance Use Topics  . Smoking status: Former Research scientist (life sciences)  . Smokeless tobacco: Never Used  . Alcohol Use: No    Review of Systems Constitutional: No fever/chills Eyes: No visual changes. ENT: No sore throat. Cardiovascular: Denies chest pain. Respiratory: Denies shortness of breath. Gastrointestinal: No abdominal pain.  No nausea, no vomiting.  No diarrhea.  No constipation. Genitourinary: Negative for dysuria. Positive for indwelling catheter. Musculoskeletal: Negative for back pain. Skin: Negative for rash. Neurological:  Negative for headaches, focal weakness or numbness.  10-point ROS otherwise negative.  ____________________________________________   PHYSICAL EXAM:  VITAL SIGNS: ED Triage Vitals  Enc Vitals Group     BP 03/16/15 1222 102/48 mmHg     Pulse Rate 03/16/15 1222 95     Resp 03/16/15 1222 16     Temp 03/16/15 1222 98.2 F (36.8 C)     Temp Source 03/16/15 1222 Oral     SpO2 03/16/15 1222 97 %     Weight 03/16/15 1222 251 lb (113.853 kg)     Height 03/16/15 1222 6\' 2"  (1.88 m)     Head Cir --      Peak Flow --      Pain Score --      Pain Loc --      Pain Edu? --      Excl. in [place]? --     Constitutional: Alert and oriented. Well appearing and in no acute distress. Eyes: Conjunctivae are normal. PERRL. EOMI. Head: Atraumatic. Nose: No congestion/rhinnorhea. Mouth/Throat: Mucous membranes are moist.  Oropharynx non-erythematous. Neck: No stridor.   Cardiovascular: Normal rate, regular rhythm. Grossly normal heart sounds.  Good peripheral circulation. Respiratory: Normal respiratory effort.  No retractions. Lungs CTAB. Gastrointestinal: Soft and nontender. No distention. No abdominal bruits. No CVA tenderness. Pain secondary to indwelling catheter but no evidence of infection or drainage. Musculoskeletal: No lower extremity tenderness nor edema.  No joint effusions. Neurologic:  Normal speech and language. No gross focal neurologic deficits are appreciated. No gait instability. Skin:  Skin is warm, dry and intact. No rash noted. Psychiatric: Mood and affect are normal. Speech and behavior are normal.  ____________________________________________   LABS (all labs ordered are listed, but only abnormal results are displayed)  Labs Reviewed - No data to display ____________________________________________   PROCEDURES  Procedure(s) performed: None  Critical Care performed: No  ____________________________________________   INITIAL IMPRESSION / ASSESSMENT AND PLAN / ED  COURSE  Pertinent labs & imaging results that were available during my care of the patient were reviewed by me and considered in my medical decision making (see chart for details).  Indwelling Foley catheter removed. Patient to follow up with PCP or and urology as directed. He is to return to the ER with any worsening symptomology or acute urinary retention. ____________________________________________   FINAL CLINICAL IMPRESSION(S) / ED DIAGNOSES  Final diagnoses:  H/O urinary retention      Arlyss Repress, PA-C 03/16/15 1338  Daymon Larsen, MD 03/16/15 1359

## 2015-03-17 LAB — URINE CULTURE: Culture: >=100000

## 2015-03-18 NOTE — Progress Notes (Signed)
Patient was seen in the ED on 03-16-15, diagnosed with a UTI and discharged on Bactrim 1 DS tablet PO BID for 7 days. Urine culture grew enterococcus faecalis which was sensitive to ampicillin. Called patient at [phone] and explained culture results and instructed him to stop taking Bactrim and start taking new prescription called into pharmacy.  Called in the following prescription to [facility] [address] in [place], [place]:  Amoxicillin 500 mg PO BID x 7 days  MD: Deborha Payment

## 2015-03-20 ENCOUNTER — Encounter: Payer: Self-pay | Admitting: Nurse Practitioner

## 2015-03-20 NOTE — Assessment & Plan Note (Signed)
Will obtain updated CMET. Continue current regimen.

## 2015-03-20 NOTE — Assessment & Plan Note (Signed)
Asked pt and daughter to get pt scheduled with new endocrinologist. They would like a referral within [facility]. Referral placed today. Asked pt to get eye exam. Will follow.

## 2015-03-20 NOTE — Assessment & Plan Note (Signed)
Will obtain updated lipid profile. Pt currently on 81 mg of ASA.

## 2015-03-20 NOTE — Assessment & Plan Note (Signed)
Will obtain CMET and A1c today. Asked pt to stay hydrated with non-caffeinated beverages.

## 2015-03-20 NOTE — Assessment & Plan Note (Signed)
Patient is still non-compliant. With compliance of medical treatment. Will refer to [facility] for help with care.

## 2015-03-27 ENCOUNTER — Other Ambulatory Visit: Payer: Self-pay | Admitting: Endocrinology

## 2015-03-31 ENCOUNTER — Other Ambulatory Visit: Payer: Self-pay

## 2015-03-31 DIAGNOSIS — IMO0002 Reserved for concepts with insufficient information to code with codable children: Secondary | ICD-10-CM

## 2015-03-31 DIAGNOSIS — E1129 Type 2 diabetes mellitus with other diabetic kidney complication: Secondary | ICD-10-CM

## 2015-03-31 DIAGNOSIS — E1165 Type 2 diabetes mellitus with hyperglycemia: Principal | ICD-10-CM

## 2015-03-31 NOTE — Telephone Encounter (Signed)
Please advise? There are allergy contraindications

## 2015-04-02 ENCOUNTER — Telehealth: Payer: Self-pay | Admitting: Nurse Practitioner

## 2015-04-02 ENCOUNTER — Ambulatory Visit (INDEPENDENT_AMBULATORY_CARE_PROVIDER_SITE_OTHER): Payer: Medicare Other | Admitting: Nurse Practitioner

## 2015-04-02 ENCOUNTER — Other Ambulatory Visit: Payer: Self-pay

## 2015-04-02 VITALS — BP 116/70 | HR 103 | Temp 98.4°F | Resp 18 | Ht 74.0 in | Wt 249.0 lb

## 2015-04-02 DIAGNOSIS — E1165 Type 2 diabetes mellitus with hyperglycemia: Principal | ICD-10-CM

## 2015-04-02 DIAGNOSIS — IMO0002 Reserved for concepts with insufficient information to code with codable children: Secondary | ICD-10-CM

## 2015-04-02 DIAGNOSIS — E1129 Type 2 diabetes mellitus with other diabetic kidney complication: Secondary | ICD-10-CM

## 2015-04-02 MED ORDER — INSULIN DETEMIR 100 UNIT/ML FLEXPEN: 42.0000 [IU] | PEN_INJECTOR | Freq: Every day | SUBCUTANEOUS | Status: DC

## 2015-04-02 NOTE — Progress Notes (Signed)
Patient ID: Jacob Buck, male    DOB: July 23, 1940  Age: 74 y.o. MRN: [id number]  CC: Follow-up   HPI FAIRLEY COPHER presents for follow up of medications.   1) I denied his last medication approval for Novolog due to declining appointment with new Endocrinology office.   Novolog- 22 units twice daily  Levemir 42 units 10 pm   298 "a couple of sundays ago" yesterday morning 70   Taking Crestor taking every other day  ER visit recently and 2 procedures on 10/3 and 10/10  cystoscopy                                      Dr. Rogers Blocker   History Oaklee has a past medical history of Endocrine problem (2008); Arthritis (2008); Diabetes mellitus without complication (3818); Bowel trouble (2008); Diverticulosis; and Cancer (1991).   He has past surgical history that includes Colonoscopy (2003, 2014); Foot surgery (Right, 2013); and Colon surgery (2008).   His family history includes Alzheimer's disease in his brother and sister; Diabetes in his brother and sister; Heart disease in his father.He reports that he has quit smoking. He has never used smokeless tobacco. He reports that he does not drink alcohol or use illicit drugs.  Outpatient Prescriptions Prior to Visit  Medication Sig Dispense Refill  . aspirin 81 MG tablet Take 81 mg by mouth daily.    . carvedilol (COREG) 12.5 MG tablet Take 1 tablet (12.5 mg total) by mouth 2 (two) times daily with a meal. 60 tablet 2  . insulin aspart (NOVOLOG FLEXPEN) 100 UNIT/ML FlexPen Inject 20 Units into the skin 2 (two) times daily before a meal. (Patient taking differently: Inject 22 Units into the skin 2 (two) times daily before a meal. ) 15 mL 4  . Insulin Pen Needle (BD PEN NEEDLE NANO U/F) 32G X 4 MM MISC Use for insulin administration three times daily 100 each 11  . lisinopril (PRINIVIL,ZESTRIL) 40 MG tablet TAKE ONE TABLET BY MOUTH ONCE DAILY 90 tablet 1  . rosuvastatin (CRESTOR) 5 MG tablet Take 1 tablet (5 mg total) by mouth daily. 30 tablet  2  . tamsulosin (FLOMAX) 0.4 MG CAPS capsule Take 1 capsule (0.4 mg total) by mouth daily. 30 capsule 1  . Insulin Detemir (LEVEMIR FLEXPEN) 100 UNIT/ML Pen Inject 40 Units into the skin daily at 10 pm. (Patient taking differently: Inject 42 Units into the skin at bedtime. ) 15 mL 4  . sulfamethoxazole-trimethoprim (BACTRIM DS) 800-160 MG per tablet Take 1 tablet by mouth 2 (two) times daily. 14 tablet 0  . Vitamin D, Ergocalciferol, (DRISDOL) 50000 UNITS CAPS capsule Take 1 capsule (50,000 Units total) by mouth every 7 (seven) days. (Patient not taking: Reported on 04/02/2015) 30 capsule 1   No facility-administered medications prior to visit.    ROS Review of Systems  Constitutional: Negative for fever, chills, diaphoresis and fatigue.  Eyes: Negative for visual disturbance.  Respiratory: Negative for chest tightness, shortness of breath and wheezing.   Cardiovascular: Negative for chest pain, palpitations and leg swelling.  Gastrointestinal: Negative for nausea, vomiting and diarrhea.  Endocrine: Negative for polydipsia, polyphagia and polyuria.  Genitourinary: Negative for penile swelling, scrotal swelling, difficulty urinating, penile pain and testicular pain.       Improved urination  Skin: Negative for rash.  Neurological: Positive for numbness. Negative for dizziness and weakness.  Neuropathy  Psychiatric/Behavioral: The patient is not nervous/anxious.    Objective:  BP 116/70 mmHg  Pulse 103  Temp(Src) 98.4 F (36.9 C)  Resp 18  Ht 6\' 2"  (1.88 m)  Wt 249 lb (112.946 kg)  BMI 31.96 kg/m2  SpO2 98%  Physical Exam  Constitutional: He is oriented to person, place, and time. He appears well-developed and well-nourished. No distress.  HENT:  Head: Normocephalic and atraumatic.  Right Ear: External ear normal.  Left Ear: External ear normal.  Poor dentition  Cardiovascular: Normal rate, regular rhythm and normal heart sounds.   Pulmonary/Chest: Effort normal and  breath sounds normal. No respiratory distress. He has no wheezes. He has no rales. He exhibits no tenderness.  Neurological: He is alert and oriented to person, place, and time.  Skin: Skin is warm and dry. No rash noted. He is not diaphoretic.  Psychiatric: He has a normal mood and affect. His behavior is normal. Judgment and thought content normal.   Assessment & Plan:   [name] was seen today for follow-up.  Diagnoses and all orders for this visit:  Type 2 diabetes, uncontrolled, with renal manifestation -     Insulin Detemir (LEVEMIR FLEXPEN) 100 UNIT/ML Pen; Inject 42 Units into the skin at bedtime.   I have discontinued Mr. Nagengast sulfamethoxazole-trimethoprim. I have also changed his Insulin Detemir. Additionally, I am having him maintain his insulin aspart, Insulin Pen Needle, aspirin, carvedilol, Vitamin D (Ergocalciferol), rosuvastatin, lisinopril, and tamsulosin.  Meds ordered this encounter  Medications  . Insulin Detemir (LEVEMIR FLEXPEN) 100 UNIT/ML Pen    Sig: Inject 42 Units into the skin at bedtime.    Dispense:  15 mL    Refill:  0    Order Specific Question:  Supervising Provider    Answer:  Crecencio Mc [[id number]]     Follow-up: Return in about 1 week (around 04/09/2015) for Phone follow up.

## 2015-04-02 NOTE — Telephone Encounter (Signed)
It was refused to be refilled as he needs to schedule an appointment with C. Doss.  Can you please advise his daughter.  Thanks

## 2015-04-02 NOTE — Telephone Encounter (Signed)
Pt daughter called about pt's Novolog flex pin that was not called into the pharmacy per pharmacy. Pharmacy is [facility] [address] Thank You!

## 2015-04-02 NOTE — Telephone Encounter (Signed)
Ok! I called daughter back she scheduled an appt for today @2 :30p. Thank You!

## 2015-04-02 NOTE — Progress Notes (Signed)
Pre visit review using our clinic review tool, if applicable. No additional management support is needed unless otherwise documented below in the visit note. 

## 2015-04-02 NOTE — Telephone Encounter (Signed)
Patient has scheduled appointment, Please advise for refill?

## 2015-04-02 NOTE — Patient Instructions (Signed)
Try Levemir 21 units in the morning (take Blood sugar before taking levemir) and 21 units at night 10 pm.   I will call you in 1 week or less to see what your blood sugars are doing (take twice daily before taking the levemir).

## 2015-04-02 NOTE — Telephone Encounter (Signed)
I will resend the prescription to [facility] for approval, thanks!

## 2015-04-03 ENCOUNTER — Ambulatory Visit: Payer: Self-pay

## 2015-04-03 ENCOUNTER — Encounter: Payer: Self-pay | Admitting: Nurse Practitioner

## 2015-04-03 NOTE — Assessment & Plan Note (Addendum)
Pt has financial issues and on a fixed income. He cannot afford both Levemir and Novolog. Will switch to just Levemir in the morning 21 units and in the evening 21 units. I will call him in 1 week to get his daily numbers to increase or decrease as necessary. Refill given. No eye appointment scheduled and he states he can't afford it with all of the specialists he is seeing and procedures that will be done in Oct. Gave his daughter form for Medication help at [facility]

## 2015-04-09 ENCOUNTER — Telehealth: Payer: Self-pay

## 2015-04-09 ENCOUNTER — Telehealth: Payer: Self-pay | Admitting: Nurse Practitioner

## 2015-04-09 NOTE — Telephone Encounter (Signed)
Tried [employer] to find out about blood sugars this past week on new regimen. VM not set up and phone off. Will try home later today.

## 2015-04-09 NOTE — Telephone Encounter (Signed)
Pt states that his blood sugars were Monday morning and evening were in the 200's, Tuesday 243 morning- 212 evening, Wednesday 196 morning-172 evening and Thursday 202 in the morning. States he doesn't think it has been doing any better on the Levemir.

## 2015-04-09 NOTE — Telephone Encounter (Signed)
-----   Message from Rubbie Battiest, NP sent at 04/09/2015  8:51 AM EDT ----- Please call and find out what his blood sugars have been running on the levemir regimen. I need at least 3 days of morning and night to determine what to do next. Thanks!

## 2015-04-09 NOTE — Telephone Encounter (Signed)
Informed pt of increase of his Levemir and to keep at least 3 days of readings in the morning and evenings, pt verbalized understanding

## 2015-04-09 NOTE — Telephone Encounter (Signed)
Up to 23 units in the morning and 23 units at bedtime of Levemir.

## 2015-04-20 ENCOUNTER — Telehealth: Payer: Self-pay

## 2015-04-20 NOTE — Telephone Encounter (Signed)
Pt just got home from an appt with Dr Rogers Blocker. Pt states that he is checking his BS twice a day, once in the morning right after getting out of bed before he eats anything and then about 10:00 at night. BS ran 120 Sunday morning and 112 Sunday evening. Monday morning it was running 40 so he did not give himself a shot and ate something and it came up. He states that he is taking 24 units of insulin two times a day in the morning and the evening. States he is feeling much better. He is going to have his daughter set him up for an eye exam with Dr Gretta Cool at [facility] and will let us know when it is.

## 2015-04-20 NOTE — Telephone Encounter (Signed)
-----   Message from Rubbie Battiest, NP sent at 04/20/2015  8:09 AM EDT ----- Please call patient and ask for his blood sugars since the upping of his insulin. Verify how much and how he is taking it.  Thank you!

## 2015-04-23 ENCOUNTER — Encounter
Admission: RE | Admit: 2015-04-23 | Discharge: 2015-04-23 | Disposition: A | Discharge status: Home / Self Care | Payer: Medicare Other | Source: Ambulatory Visit | Attending: Urology | Admitting: Urology

## 2015-04-23 DIAGNOSIS — Z01818 Encounter for other preprocedural examination: Secondary | ICD-10-CM | POA: Insufficient documentation

## 2015-04-23 DIAGNOSIS — R339 Retention of urine, unspecified: Secondary | ICD-10-CM | POA: Diagnosis not present

## 2015-04-23 HISTORY — DX: Essential (primary) hypertension: I10

## 2015-04-23 LAB — BASIC METABOLIC PANEL
Anion gap: 6 (ref 5–15)
BUN: 34 mg/dL — ABNORMAL HIGH (ref 6–20)
CALCIUM: 9.2 mg/dL (ref 8.9–10.3)
CO2: 25 mmol/L (ref 22–32)
CREATININE: 1.69 mg/dL — AB (ref 0.61–1.24)
Chloride: 105 mmol/L (ref 101–111)
GFR calc Af Amer: 44 mL/min — ABNORMAL LOW (ref >60)
GFR calc non Af Amer: 38 mL/min — ABNORMAL LOW (ref >60)
GLUCOSE: 182 mg/dL — AB (ref 65–99)
Potassium: 5.3 mmol/L — ABNORMAL HIGH (ref 3.5–5.1)
Sodium: 136 mmol/L (ref 135–145)

## 2015-04-23 NOTE — Patient Instructions (Signed)
  Your procedure is scheduled on: Tuesday 04/28/15 Report to Day Surgery. 2ND FLOOR MEDICAL MALL ENTRANCE To find out your arrival time please call [phone] between 1PM - 3PM on Monday 04/27/15.  Remember: Instructions that are not followed completely may result in serious medical risk, up to and including death, or upon the discretion of your surgeon and anesthesiologist your surgery may need to be rescheduled.    __X__ 1. Do not eat food or drink liquids after midnight. No gum chewing or hard candies.     __X__ 2. No Alcohol for 24 hours before or after surgery.   ____ 3. Bring all medications with you on the day of surgery if instructed.    __X__ 4. Notify your doctor if there is any change in your medical condition     (cold, fever, infections).     Do not wear jewelry, make-up, hairpins, clips or nail polish.  Do not wear lotions, powders, or perfumes. You may wear deodorant.  Do not shave 48 hours prior to surgery. Men may shave face and neck.  Do not bring valuables to the hospital.    [facility] is not responsible for any belongings or valuables.               Contacts, dentures or bridgework may not be worn into surgery.  Leave your suitcase in the car. After surgery it may be brought to your room.  For patients admitted to the hospital, discharge time is determined by your                treatment team.   Patients discharged the day of surgery will not be allowed to drive home.   Please read over the following fact sheets that you were given:   Surgical Site Infection Prevention   __X__ Take these medicines the morning of surgery with A SIP OF WATER:    1. CARVEDILOL  2. LISINOPRIL  3.   4.  5.  6.  ____ Fleet Enema (as directed)   ____ Use CHG Soap as directed  ____ Use inhalers on the day of surgery  ____ Stop metformin 2 days prior to surgery    __X__ Take 1/2 of usual insulin dose the night before surgery and none on the morning of surgery.     __X__ Stop Coumadin/Plavix/aspirin on AS DIRECTED BY YOUR DOCTOR  ____ Stop Anti-inflammatories on    ____ Stop supplements until after surgery.    ____ Bring C-Pap to the hospital.

## 2015-04-24 NOTE — H&P (Signed)
NAMESIRIUS, WOODFORD NO.:  [id number]  MEDICAL RECORD NO.:  [id number]  LOCATION:  PERIO                        FACILITY:  [facility]  PHYSICIAN:  Maryan Puls          DATE OF BIRTH:  04-12-1941  DATE OF ADMISSION:  04/28/2015 DATE OF DISCHARGE:                            HISTORY AND PHYSICAL   CHIEF COMPLAINT:  Urinary retention.  HISTORY OF PRESENT ILLNESS:  The patient is a 74 year old, Afro-American male, who developed acute urinary retention on September 8th, prompting Foley catheter placement.  Prior to the retention, he had developed urinary incontinence and difficulty voiding.  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 Maximum flow rate was noted to be 9 mL/second while on tamsulosin 0.4 mg per day.  The patient comes in now for cystoscopy with internal urethrotomy using the holmium laser.  ALLERGIES:  TO TOUJEO.  CURRENT MEDICATIONS:  Lisinopril, carvedilol, Crestor, tamsulosin, NovoLog insulin, Levemir, and vitamin D.  PRIOR SURGICAL HISTORY:  Colon resection due to colon cancer in 1991, right great toe amputation in 2013, cystoscopy with internal urethrotomy in 2013.  SOCIAL HISTORY:  The patient had tobacco use.  He quit smoking many years ago.  He denied alcohol use.  FAMILY HISTORY:  Father died of heart disease, at unknown age.  CURRENT MEDICAL CONDITIONS: 1. Diabetes. 2. Hypertension. 3. Hyperlipidemia. 4. Diabetic neuropathy. 5. History of diabetic foot ulcers. 6. 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 7. Cardiomyopathy. 8. Depression.  REVIEW OF SYSTEMS:  The patient had decreased visual acuity and numbness and tingling of his feet.  He has chronic insomnia.  Denied chest pain, shortness of breath, stroke or heart disease.  PHYSICAL EXAMINATION:  GENERAL:  A generally obese Afro-American male, in no acute distress. HEENT:  Sclerae were clear.  Pupils were  equally round, reactive to light and accommodation.  Extraocular movements were intact. NECK:  Supple.  No palpable cervical adenopathy. LUNGS:  Clear to auscultation. CARDIOVASCULAR:  Regular rhythm and rate without audible murmurs. ABDOMEN:  Soft, nontender abdomen. GU:  Circumcised testes, atrophic, 16 mL size each. RECTAL:  A 25 g smooth, nontender prostate. NEUROMUSCULAR:  Alert and oriented x3.  IMPRESSION: 1. Recent urinary retention. 2. Urethral stricture disease.  PLAN:  Cysto with internal urethrotomy using the holmium laser.          ______________________________ Maryan Puls     MW/MEDQ  D:  04/23/2015  T:  04/23/2015  Job:  [id number]

## 2015-04-27 MED ORDER — TRIAMCINOLONE ACETONIDE 40 MG/ML IJ SUSP
INTRAMUSCULAR | Status: AC
  Filled 2015-04-27: qty 1

## 2015-04-27 MED ORDER — MIDAZOLAM HCL 5 MG/5ML IJ SOLN
INTRAMUSCULAR | Status: AC
  Filled 2015-04-27: qty 5

## 2015-04-27 MED ORDER — BUPIVACAINE HCL (PF) 0.25 % IJ SOLN
INTRAMUSCULAR | Status: AC
  Filled 2015-04-27: qty 30

## 2015-04-27 MED ORDER — FENTANYL CITRATE (PF) 100 MCG/2ML IJ SOLN
INTRAMUSCULAR | Status: AC
  Filled 2015-04-27: qty 2

## 2015-04-27 MED ORDER — ORPHENADRINE CITRATE 30 MG/ML IJ SOLN
INTRAMUSCULAR | Status: AC
  Filled 2015-04-27: qty 2

## 2015-04-28 ENCOUNTER — Encounter
Admission: RE | Disposition: A | Discharge status: Home / Self Care | Payer: Self-pay | Source: Ambulatory Visit | Attending: Urology

## 2015-04-28 ENCOUNTER — Telehealth: Payer: Self-pay

## 2015-04-28 ENCOUNTER — Ambulatory Visit: Payer: Medicare Other | Admitting: Anesthesiology

## 2015-04-28 ENCOUNTER — Ambulatory Visit
Admission: RE | Admit: 2015-04-28 | Discharge: 2015-04-28 | Disposition: A | Discharge status: Home / Self Care | Payer: Medicare Other | Source: Ambulatory Visit | Attending: Urology | Admitting: Urology

## 2015-04-28 ENCOUNTER — Encounter: Payer: Self-pay | Admitting: *Deleted

## 2015-04-28 DIAGNOSIS — N401 Enlarged prostate with lower urinary tract symptoms: Secondary | ICD-10-CM | POA: Diagnosis not present

## 2015-04-28 DIAGNOSIS — F329 Major depressive disorder, single episode, unspecified: Secondary | ICD-10-CM | POA: Diagnosis not present

## 2015-04-28 DIAGNOSIS — I10 Essential (primary) hypertension: Secondary | ICD-10-CM | POA: Diagnosis not present

## 2015-04-28 DIAGNOSIS — E785 Hyperlipidemia, unspecified: Secondary | ICD-10-CM | POA: Diagnosis not present

## 2015-04-28 DIAGNOSIS — E1140 Type 2 diabetes mellitus with diabetic neuropathy, unspecified: Secondary | ICD-10-CM | POA: Diagnosis not present

## 2015-04-28 DIAGNOSIS — Z87891 Personal history of nicotine dependence: Secondary | ICD-10-CM | POA: Insufficient documentation

## 2015-04-28 DIAGNOSIS — Z79899 Other long term (current) drug therapy: Secondary | ICD-10-CM | POA: Diagnosis not present

## 2015-04-28 DIAGNOSIS — N39498 Other specified urinary incontinence: Secondary | ICD-10-CM | POA: Insufficient documentation

## 2015-04-28 DIAGNOSIS — Z794 Long term (current) use of insulin: Secondary | ICD-10-CM | POA: Diagnosis not present

## 2015-04-28 DIAGNOSIS — N358 Other urethral stricture: Secondary | ICD-10-CM

## 2015-04-28 DIAGNOSIS — N359 Urethral stricture, unspecified: Secondary | ICD-10-CM | POA: Diagnosis not present

## 2015-04-28 DIAGNOSIS — R338 Other retention of urine: Secondary | ICD-10-CM | POA: Insufficient documentation

## 2015-04-28 DIAGNOSIS — E11621 Type 2 diabetes mellitus with foot ulcer: Secondary | ICD-10-CM | POA: Insufficient documentation

## 2015-04-28 DIAGNOSIS — I429 Cardiomyopathy, unspecified: Secondary | ICD-10-CM | POA: Diagnosis not present

## 2015-04-28 HISTORY — PX: CYSTOSCOPY WITH DIRECT VISION INTERNAL URETHROTOMY: SHX6637

## 2015-04-28 LAB — POCT I-STAT 4, (NA,K, GLUC, HGB,HCT)
Glucose, Bld: 126 mg/dL — ABNORMAL HIGH (ref 65–99)
HCT: 37 % — ABNORMAL LOW (ref 39.0–52.0)
Hemoglobin: 12.6 g/dL — ABNORMAL LOW (ref 13.0–17.0)
Potassium: 6.6 mmol/L (ref 3.5–5.1)
Sodium: 134 mmol/L — ABNORMAL LOW (ref 135–145)

## 2015-04-28 LAB — GLUCOSE, CAPILLARY
GLUCOSE-CAPILLARY: 110 mg/dL — AB (ref 65–99)
GLUCOSE-CAPILLARY: 122 mg/dL — AB (ref 65–99)

## 2015-04-28 LAB — POTASSIUM: POTASSIUM: 4 mmol/L (ref 3.5–5.1)

## 2015-04-28 SURGERY — CYSTOSCOPY, WITH DIRECT VISION INTERNAL URETHROTOMY
Anesthesia: General | Wound class: Clean Contaminated

## 2015-04-28 MED ORDER — LEVOFLOXACIN IN D5W 500 MG/100ML IV SOLN
INTRAVENOUS | Status: AC
  Administered 2015-04-28: 500 mg via INTRAVENOUS
  Filled 2015-04-28: qty 100

## 2015-04-28 MED ORDER — SODIUM CHLORIDE 0.9 % IV SOLN
INTRAVENOUS | Status: DC
  Administered 2015-04-28: 13:00:00 via INTRAVENOUS

## 2015-04-28 MED ORDER — MIDAZOLAM HCL 5 MG/5ML IJ SOLN
INTRAMUSCULAR | Status: DC | PRN
  Administered 2015-04-28: 1 mg via INTRAVENOUS

## 2015-04-28 MED ORDER — SODIUM CHLORIDE 0.9 % IV SOLN
INTRAVENOUS | Status: DC | PRN
  Administered 2015-04-28: 14:00:00 via INTRAVENOUS

## 2015-04-28 MED ORDER — FAMOTIDINE 20 MG PO TABS
20.0000 mg | ORAL_TABLET | Freq: Once | ORAL | Status: AC
  Administered 2015-04-28: 20 mg via ORAL

## 2015-04-28 MED ORDER — LEVOFLOXACIN IN D5W 500 MG/100ML IV SOLN
500.0000 mg | INTRAVENOUS | Status: DC
  Administered 2015-04-28: 500 mg via INTRAVENOUS

## 2015-04-28 MED ORDER — BELLADONNA ALKALOIDS-OPIUM 16.2-60 MG RE SUPP
RECTAL | Status: AC
  Filled 2015-04-28: qty 1

## 2015-04-28 MED ORDER — PROPOFOL 10 MG/ML IV BOLUS
INTRAVENOUS | Status: DC | PRN
  Administered 2015-04-28: 150 mg via INTRAVENOUS

## 2015-04-28 MED ORDER — LIDOCAINE HCL 2 % EX GEL
CUTANEOUS | Status: AC
  Filled 2015-04-28: qty 10

## 2015-04-28 MED ORDER — SODIUM CHLORIDE 0.9 % IR SOLN
Status: DC | PRN
  Administered 2015-04-28: 900 mL

## 2015-04-28 MED ORDER — BELLADONNA ALKALOIDS-OPIUM 16.2-60 MG RE SUPP
RECTAL | Status: DC | PRN
  Administered 2015-04-28: 1 via RECTAL

## 2015-04-28 MED ORDER — PHENYLEPHRINE HCL 10 MG/ML IJ SOLN
INTRAMUSCULAR | Status: DC | PRN
  Administered 2015-04-28 (×4): 100 ug via INTRAVENOUS
  Administered 2015-04-28: 50 ug via INTRAVENOUS

## 2015-04-28 MED ORDER — NUCYNTA 50 MG PO TABS: 50.0000 mg | ORAL_TABLET | Freq: Four times a day (QID) | ORAL | Status: DC | PRN

## 2015-04-28 MED ORDER — LIDOCAINE HCL 2 % EX GEL
CUTANEOUS | Status: DC | PRN
  Administered 2015-04-28: 1

## 2015-04-28 MED ORDER — LIDOCAINE HCL (CARDIAC) 20 MG/ML IV SOLN
INTRAVENOUS | Status: DC | PRN
  Administered 2015-04-28: 60 mg via INTRAVENOUS

## 2015-04-28 MED ORDER — URIBEL 118 MG PO CAPS: 1.0000 | ORAL_CAPSULE | Freq: Four times a day (QID) | ORAL | Status: DC | PRN

## 2015-04-28 MED ORDER — FAMOTIDINE 20 MG PO TABS
ORAL_TABLET | ORAL | Status: AC
  Administered 2015-04-28: 20 mg via ORAL
  Filled 2015-04-28: qty 1

## 2015-04-28 MED ORDER — FENTANYL CITRATE (PF) 100 MCG/2ML IJ SOLN: 25.0000 ug | INTRAMUSCULAR | Status: DC | PRN

## 2015-04-28 MED ORDER — ONDANSETRON HCL 4 MG/2ML IJ SOLN: 4.0000 mg | Freq: Once | INTRAMUSCULAR | Status: DC | PRN

## 2015-04-28 MED ORDER — DOCUSATE SODIUM 100 MG PO CAPS: 200.0000 mg | ORAL_CAPSULE | Freq: Two times a day (BID) | ORAL | Status: DC

## 2015-04-28 MED ORDER — FENTANYL CITRATE (PF) 100 MCG/2ML IJ SOLN
INTRAMUSCULAR | Status: DC | PRN
  Administered 2015-04-28 (×2): 25 ug via INTRAVENOUS

## 2015-04-28 MED ORDER — LEVOFLOXACIN 500 MG PO TABS: 500.0000 mg | ORAL_TABLET | Freq: Every day | ORAL | Status: DC

## 2015-04-28 SURGICAL SUPPLY — 22 items
BAG DRAIN CYSTO-URO LG1000N (MISCELLANEOUS) ×3 IMPLANT
BAG URO DRAIN 2000ML W/SPOUT (MISCELLANEOUS) ×3 IMPLANT
CATH FOLEY 2W COUNCIL 20FR 5CC (CATHETERS) ×3 IMPLANT
CATH FOLEY 2WAY  5CC 20FR SIL (CATHETERS)
CATH FOLEY 2WAY 5CC 20FR SIL (CATHETERS) IMPLANT
FEE TECHNICIAN ONLY PER HOUR (MISCELLANEOUS) IMPLANT
GLOVE BIO SURGEON STRL SZ7 (GLOVE) ×6 IMPLANT
GLOVE BIO SURGEON STRL SZ7.5 (GLOVE) ×3 IMPLANT
GOWN STRL REUS W/ TWL LRG LVL3 (GOWN DISPOSABLE) ×1 IMPLANT
GOWN STRL REUS W/ TWL XL LVL3 (GOWN DISPOSABLE) ×1 IMPLANT
GOWN STRL REUS W/TWL LRG LVL3 (GOWN DISPOSABLE) ×2
GOWN STRL REUS W/TWL XL LVL3 (GOWN DISPOSABLE) ×2
KIT RM TURNOVER CYSTO AR (KITS) ×3 IMPLANT
LASER HOLMIUM FIBER SU 272UM (MISCELLANEOUS) IMPLANT
LASER HOLMIUM PROCEDURE (MISCELLANEOUS) ×3 IMPLANT
PACK CYSTO AR (MISCELLANEOUS) ×3 IMPLANT
PREP PVP WINGED SPONGE (MISCELLANEOUS) ×3 IMPLANT
SET CYSTO W/LG BORE CLAMP LF (SET/KITS/TRAYS/PACK) ×3 IMPLANT
SOL PREP PVP 2OZ (MISCELLANEOUS) ×3
SOLUTION PREP PVP 2OZ (MISCELLANEOUS) ×1 IMPLANT
WATER STERILE IRR 1000ML POUR (IV SOLUTION) ×3 IMPLANT
WATER STERILE IRR 3000ML UROMA (IV SOLUTION) ×3 IMPLANT

## 2015-04-28 NOTE — Op Note (Signed)
Preoperative diagnosis: Urethral stricture disease Postoperative diagnosis: Same  Procedure: 1. Visual internal ureterotomy using holmium laser                      2.  Foley catheter placement Surgeon: Otelia Limes. Yves Dill MD, FACS Anesthesia: Gen.  Indications:See the history and physical. After informed consent the above procedure(s) were requested     Technique and findings: After adequate general anesthesia had been obtained the patient was placed into dorsal lithotomy position and the perineum was prepped and draped in the usual fashion. The 21 French cystoscope was coupled to the camera and visually advanced into the urethra. Patient had diffuse stricture disease however it allowed passage of the scope without tension. A narrow stricture was encountered at the bulbar urethra. The caliber of the urethra was approximately 3 mm. A 0.035 guidewire was advanced through the stricture and curled into the bladder. The cyst scope was then removed taking care leave the guidewire position. The cystoscope was then reintroduced up to the level of the stricture. The 1000  holmium laser fiber was introduced through the scope. The stricture was incised at the 12:00 position eventually allowing easy passage of the scope into the bladder. Bladder was thoroughly inspected. Both ureteral orifices were identified and had clear reflux. No bladder tumors were identified. The scope was then removed and 10 cc of viscous Xylocaine instilled within the urethra and bladder. A 20 [employer] catheter was advanced over the guidewire and placed into the bladder. Guidewire was then removed. A B&O suppository was placed. The procedure was then terminated and patient transferred to the recovery room in stable condition.

## 2015-04-28 NOTE — Anesthesia Postprocedure Evaluation (Signed)
  Anesthesia Post-op Note  Patient: Jacob Buck  Procedure(s) Performed: Procedure(s): CYSTOSCOPY WITH DIRECT VISION INTERNAL URETHROTOMY WITH HOLMIUM LASER  (N/A)  Anesthesia type:General  Patient location: PACU  Post pain: Pain level controlled  Post assessment: Post-op Vital signs reviewed, Patient's Cardiovascular Status Stable, Respiratory Function Stable, Patent Airway and No signs of Nausea or vomiting  Post vital signs: Reviewed and stable  Last Vitals:  Filed Vitals:   04/28/15 1427  BP: 113/73  Pulse: 77  Temp: 35.9 C  Resp: 15    Level of consciousness: awake, alert  and patient cooperative  Complications: No apparent anesthesia complications

## 2015-04-28 NOTE — Anesthesia Procedure Notes (Signed)
Procedure Name: LMA Insertion Date/Time: 04/28/2015 1:45 PM Performed by: Dionne Bucy Pre-anesthesia Checklist: Patient identified, Patient being monitored, Timeout performed, Emergency Drugs available and Suction available Patient Re-evaluated:Patient Re-evaluated prior to inductionOxygen Delivery Method: Circle system utilized Preoxygenation: Pre-oxygenation with 100% oxygen Intubation Type: IV induction Ventilation: Mask ventilation without difficulty LMA: LMA inserted LMA Size: 5.0 Tube type: Oral Number of attempts: 1 Placement Confirmation: positive ETCO2 Tube secured with: Tape Dental Injury: Teeth and Oropharynx as per pre-operative assessment

## 2015-04-28 NOTE — OR Nursing (Signed)
Patient with very difficult IV access. #20 gauge IV in the left hand unable to advance too far in but with good blood return and well secured with iv fluids running okay. Unable to draw blood for K+ off of it. After multiple IV attempts by two RN's nurse anesthetics and Dr. Boston Service a I-stat K was drawn with result of 6.6 and felt by Dr. Boston Service to be hemolyzed. He cleared patient to go to OR.

## 2015-04-28 NOTE — H&P (Signed)
Date of Initial H&P: 04/23/15  History reviewed, patient examined, no change in status, stable for surgery.

## 2015-04-28 NOTE — Anesthesia Preprocedure Evaluation (Addendum)
Anesthesia Evaluation  Patient identified by MRN, date of birth, ID band Patient awake    Reviewed: Allergy & Precautions, NPO status , Patient's Chart, lab work & pertinent test results  Airway Mallampati: III       Dental  (+) Edentulous Upper, Partial Lower   Pulmonary former smoker,     + decreased breath sounds      Cardiovascular hypertension, Pt. on home beta blockers Normal cardiovascular exam     Neuro/Psych    GI/Hepatic negative GI ROS, Neg liver ROS,   Endo/Other  diabetes, Type 1, Insulin Dependent  Renal/GU Renal InsufficiencyRenal disease     Musculoskeletal   Abdominal (+) + obese,   Peds  Hematology   Anesthesia Other Findings   Reproductive/Obstetrics                            Anesthesia Physical Anesthesia Plan  ASA: III  Anesthesia Plan: General   Post-op Pain Management:    Induction: Intravenous  Airway Management Planned: LMA  Additional Equipment:   Intra-op Plan:   Post-operative Plan: Extubation in OR  Informed Consent: I have reviewed the patients History and Physical, chart, labs and discussed the procedure including the risks, benefits and alternatives for the proposed anesthesia with the patient or authorized representative who has indicated his/her understanding and acceptance.     Plan Discussed with: CRNA  Anesthesia Plan Comments:         Anesthesia Quick Evaluation

## 2015-04-28 NOTE — Telephone Encounter (Signed)
Called about eye exam. Patients wife answered. Said she will schedule it and return my phone call within the next few days. Jacob Buck has surgery this morning and is occupied.

## 2015-04-28 NOTE — OR Nursing (Signed)
Dr. Janeann Forehand cleared patient to go to surgery without serum K+ from lab.

## 2015-04-28 NOTE — Transfer of Care (Signed)
Immediate Anesthesia Transfer of Care Note  Patient: Jacob Buck  Procedure(s) Performed: Procedure(s): CYSTOSCOPY WITH DIRECT VISION INTERNAL URETHROTOMY WITH HOLMIUM LASER  (N/A)  Patient Location: PACU  Anesthesia Type:General  Level of Consciousness: awake and patient cooperative  Airway & Oxygen Therapy: Patient Spontanous Breathing and Patient connected to face mask oxygen  Post-op Assessment: Report given to RN and Post -op Vital signs reviewed and stable  Post vital signs: Reviewed and stable  Last Vitals:  Filed Vitals:   04/28/15 1432  BP: 113/73  Pulse: 78  Temp: 36.4 C  Resp: 18    Complications: No apparent anesthesia complications

## 2015-04-28 NOTE — Discharge Instructions (Addendum)
Urethrotomy, Male Urethrotomy is a surgical procedure to treat a section of the urethra that is too narrow. The urethra is the tube that carries urine from your bladder out through the tip of your penis. Narrowing of the urethra (urethral stricture) makes it hard or painful to pass urine. You may also be at risk for more frequent urinary infections. Scarring from infection or injury are common causes of urethral stricture. During urethrotomy, your surgeon first passes a thin telescope (cystoscope) into your urethra. Then the surgeon uses a cutting instrument (cold knife) or a heat source (hot knife) to remove any urethral strictures.  LET [facility] CARE PROVIDER KNOW ABOUT:  Any allergies you have.  All medicines you are taking, including vitamins, herbs, eye drops, creams, and over-the-counter medicines.  Previous problems you or members of your family have had with the use of anesthetics.  Any blood disorders you have.  Previous surgeries you have had.  Medical conditions you have.  Any pacemaker or defibrillator. RISKS AND COMPLICATIONS Generally, this is a safe procedure. However, as with any procedure, problems can occur. The most common problem is having another urethral stricture after surgery. Other less common problems include:   Bleeding.  Burning pain.  Infection.  Needing to put a tube (catheter) in your urethra to prevent the stricture from happening again.  Trouble getting an erection (erectile dysfunction). BEFORE THE PROCEDURE  Do not eat or drink anything after midnight on the night before the procedure or as directed by your health care provider.  Ask your health care provider about changing or stopping your regular medicines. This is especially important if you are taking diabetes medicines or blood thinners.  You may need to have a urine test to check for signs of infection.  You may get antibiotic medicines by injection or through an IV access just  before the procedure. This is to prevent infection.  Take a shower before coming to the hospital for surgery. PROCEDURE This procedure usually takes about 30 minutes. During the procedure, the following may happen:  You will be given one of the following:  A medicine that makes you go to sleep (general anesthetic).  A medicine injected into your spine that numbs your body below the waist (spinal anesthetic).  The tip of your penis will be cleaned with a germ-killing (antibacterial) solution.  The surgeon will pass the cystoscope into your urethra.  Urethral strictures will be cut with the cold or hot knife.  You will most likely have a catheter inserted through your urethra and into your bladder. This is to drain your urine. AFTER THE PROCEDURE  You will stay in the recovery area until the anesthesia wears off and your health care provider says you can go home. Follow all instructions carefully.  Take all medicines as directed by your health care provider.  You may get prescriptions for antibiotics and a pain reliever.  You may have a small bandage on the tip of your penis.  You may be sent home with a catheter in place. Follow your health care provider's instructions on how to care for the catheter at home. You will need to return to have the catheter removed as directed by your health care provider.  You may see some blood leaking around the catheter when you pass urine.  Drink enough fluid to keep your urine clear or pale yellow.  Ask your health care provider when you can go back to your normal activities after the catheter is removed.  Keep all follow-up appointments.   This information is not intended to replace advice given to you by your health care provider. Make sure you discuss any questions you have with your health care provider.   Document Released: 06/25/2013 Document Revised: 11/04/2014 Document Reviewed: 06/25/2013 Elsevier Interactive Patient Education  2016 [place], Male, Care After Refer to this sheet in the next few weeks. These instructions provide you with information on caring for yourself after your procedure. Your health care provider may also give you more specific instructions. Your treatment has been planned according to current medical practices, but problems sometimes occur. Call your health care provider if you have any problems or questions after your procedure. WHAT TO EXPECT AFTER THE PROCEDURE  After your procedure, it is typical to have the following:   Pain.  Burning pain when passing urine.  A small amount of blood in your urine.  Bloody urine leaking from around your catheter. HOME CARE INSTRUCTIONS Follow all your health care provider's home care instructions carefully. These may include:  Take all medicines as directed by your health care provider.  Follow catheter care as prescribed by your surgeon.  You may have a gauze pad on the tip of your penis. Change this pad often to keep it clean and dry. You can take a shower with your catheter in place.  Do not bathe, swim, or use a hot tub until after your catheter is removed.  Return to your health care provider as directed to have your catheter removed.  If you have to do self-catheterization after your catheter is taken out, make sure you understand the procedure completely. Follow your instructions carefully. Ask your health care provider if you have any questions.  You may eat what you usually do.  Drink enough fluid to keep your urine clear or pale yellow.  Take at least two 10-minute walks each day.  Do not lift anything heavier than 10 lb (4.5 kg) until directed by your health care provider.  Ask your health care provider when you can go back to work and do all your usual activities, including sex.  Schedule and attend follow-up visits as directed by your health care provider. It is important to keep all your appointments. SEEK  MEDICAL CARE IF:  You have chills.  You have a fever.  Your pain is not relieved by medicine.  You continue to have blood in your urine longer than expected by your health care provider. SEEK IMMEDIATE MEDICAL CARE IF:  Your pain gets worse and is not relieved by medicine.  You have heavy bleeding or clots in your urine.  You have trouble passing urine after your catheter is removed.  You have chest pain or trouble breathing.   This information is not intended to replace advice given to you by your health care provider. Make sure you discuss any questions you have with your health care provider.   Document Released: 06/25/2013 Document Revised: 11/04/2014 Document Reviewed: 06/25/2013 Elsevier Interactive Patient Education 2016 [facility]   1) The drugs that you were given will stay in your system until tomorrow so for the next 24 hours you should not:  A) Drive an automobile B) Make any legal decisions C) Drink any alcoholic beverage   2) You may resume regular meals tomorrow.  Today it is better to start with liquids and gradually work up to solid foods.  You may eat anything you  prefer, but it is better to start with liquids, then soup and crackers, and gradually work up to solid foods.   3) Please notify your doctor immediately if you have any unusual bleeding, trouble breathing, redness and pain at the surgery site, drainage, fever, or pain not relieved by medication. 4)   5) Your post-operative visit with Dr.                                     is: Date:                        Time:    Please call to schedule your post-operative visit.  6) Additional Instructions: AMBULATORY SURGERY  DISCHARGE INSTRUCTIONS   7) The drugs that you were given will stay in your system until tomorrow so for the next 24 hours you should not:  D) Drive an automobile E) Make any legal decisions F) Drink any alcoholic  beverage   8) You may resume regular meals tomorrow.  Today it is better to start with liquids and gradually work up to solid foods.  You may eat anything you prefer, but it is better to start with liquids, then soup and crackers, and gradually work up to solid foods.   9) Please notify your doctor immediately if you have any unusual bleeding, trouble breathing, redness and pain at the surgery site, drainage, fever, or pain not relieved by medication. 10)   11) Your post-operative visit with Dr.                                     is: Date:                        Time:    Please call to schedule your post-operative visit.  12) Additional Instructions: 13)

## 2015-04-29 ENCOUNTER — Encounter: Payer: Self-pay | Admitting: Urology

## 2015-04-29 ENCOUNTER — Other Ambulatory Visit: Payer: Self-pay | Admitting: Nurse Practitioner

## 2015-05-30 ENCOUNTER — Observation Stay: Payer: Medicare Other

## 2015-05-30 ENCOUNTER — Emergency Department: Payer: Medicare Other

## 2015-05-30 ENCOUNTER — Observation Stay
Admission: EM | Admit: 2015-05-30 | Discharge: 2015-05-31 | Disposition: A | Discharge status: Home / Self Care | Payer: Medicare Other | Attending: Internal Medicine | Admitting: Internal Medicine

## 2015-05-30 DIAGNOSIS — R05 Cough: Secondary | ICD-10-CM | POA: Diagnosis not present

## 2015-05-30 DIAGNOSIS — I1310 Hypertensive heart and chronic kidney disease without heart failure, with stage 1 through stage 4 chronic kidney disease, or unspecified chronic kidney disease: Secondary | ICD-10-CM | POA: Diagnosis not present

## 2015-05-30 DIAGNOSIS — I429 Cardiomyopathy, unspecified: Secondary | ICD-10-CM | POA: Diagnosis not present

## 2015-05-30 DIAGNOSIS — E349 Endocrine disorder, unspecified: Secondary | ICD-10-CM | POA: Insufficient documentation

## 2015-05-30 DIAGNOSIS — IMO0002 Reserved for concepts with insufficient information to code with codable children: Secondary | ICD-10-CM

## 2015-05-30 DIAGNOSIS — Z888 Allergy status to other drugs, medicaments and biological substances status: Secondary | ICD-10-CM | POA: Diagnosis not present

## 2015-05-30 DIAGNOSIS — I6523 Occlusion and stenosis of bilateral carotid arteries: Secondary | ICD-10-CM | POA: Insufficient documentation

## 2015-05-30 DIAGNOSIS — E669 Obesity, unspecified: Secondary | ICD-10-CM

## 2015-05-30 DIAGNOSIS — Z79899 Other long term (current) drug therapy: Secondary | ICD-10-CM | POA: Insufficient documentation

## 2015-05-30 DIAGNOSIS — R531 Weakness: Principal | ICD-10-CM

## 2015-05-30 DIAGNOSIS — N183 Chronic kidney disease, stage 3 unspecified: Secondary | ICD-10-CM

## 2015-05-30 DIAGNOSIS — Z85038 Personal history of other malignant neoplasm of large intestine: Secondary | ICD-10-CM | POA: Insufficient documentation

## 2015-05-30 DIAGNOSIS — Z9889 Other specified postprocedural states: Secondary | ICD-10-CM | POA: Insufficient documentation

## 2015-05-30 DIAGNOSIS — I639 Cerebral infarction, unspecified: Secondary | ICD-10-CM | POA: Diagnosis present

## 2015-05-30 DIAGNOSIS — D649 Anemia, unspecified: Secondary | ICD-10-CM

## 2015-05-30 DIAGNOSIS — I700 Atherosclerosis of aorta: Secondary | ICD-10-CM | POA: Insufficient documentation

## 2015-05-30 DIAGNOSIS — Z8673 Personal history of transient ischemic attack (TIA), and cerebral infarction without residual deficits: Secondary | ICD-10-CM | POA: Insufficient documentation

## 2015-05-30 DIAGNOSIS — Z6832 Body mass index (BMI) 32.0-32.9, adult: Secondary | ICD-10-CM | POA: Insufficient documentation

## 2015-05-30 DIAGNOSIS — Z8249 Family history of ischemic heart disease and other diseases of the circulatory system: Secondary | ICD-10-CM | POA: Insufficient documentation

## 2015-05-30 DIAGNOSIS — M1990 Unspecified osteoarthritis, unspecified site: Secondary | ICD-10-CM | POA: Diagnosis not present

## 2015-05-30 DIAGNOSIS — Z87891 Personal history of nicotine dependence: Secondary | ICD-10-CM | POA: Diagnosis not present

## 2015-05-30 DIAGNOSIS — Z833 Family history of diabetes mellitus: Secondary | ICD-10-CM | POA: Insufficient documentation

## 2015-05-30 DIAGNOSIS — R4182 Altered mental status, unspecified: Secondary | ICD-10-CM | POA: Diagnosis not present

## 2015-05-30 DIAGNOSIS — E1122 Type 2 diabetes mellitus with diabetic chronic kidney disease: Secondary | ICD-10-CM | POA: Insufficient documentation

## 2015-05-30 DIAGNOSIS — E1165 Type 2 diabetes mellitus with hyperglycemia: Secondary | ICD-10-CM

## 2015-05-30 DIAGNOSIS — I10 Essential (primary) hypertension: Secondary | ICD-10-CM

## 2015-05-30 DIAGNOSIS — E785 Hyperlipidemia, unspecified: Secondary | ICD-10-CM | POA: Diagnosis not present

## 2015-05-30 DIAGNOSIS — Z794 Long term (current) use of insulin: Secondary | ICD-10-CM | POA: Diagnosis not present

## 2015-05-30 DIAGNOSIS — Z820 Family history of epilepsy and other diseases of the nervous system: Secondary | ICD-10-CM | POA: Insufficient documentation

## 2015-05-30 HISTORY — DX: Hyperlipidemia, unspecified: E78.5

## 2015-05-30 LAB — URINALYSIS COMPLETE WITH MICROSCOPIC (ARMC ONLY)
BILIRUBIN URINE: NEGATIVE
Bacteria, UA: NONE SEEN
GLUCOSE, UA: 150 mg/dL — AB
KETONES UR: NEGATIVE mg/dL
LEUKOCYTES UA: NEGATIVE
NITRITE: NEGATIVE
Protein, ur: >500 mg/dL — AB
SPECIFIC GRAVITY, URINE: 1.015 (ref 1.005–1.030)
pH: 5 (ref 5.0–8.0)

## 2015-05-30 LAB — GLUCOSE, CAPILLARY
GLUCOSE-CAPILLARY: 145 mg/dL — AB (ref 65–99)
GLUCOSE-CAPILLARY: 86 mg/dL (ref 65–99)
Glucose-Capillary: 120 mg/dL — ABNORMAL HIGH (ref 65–99)

## 2015-05-30 LAB — LIPID PANEL
CHOL/HDL RATIO: 6 ratio
CHOLESTEROL: 259 mg/dL — AB (ref 0–200)
HDL: 43 mg/dL (ref >40)
LDL CALC: 152 mg/dL — AB (ref 0–99)
Triglycerides: 318 mg/dL — ABNORMAL HIGH (ref <150)
VLDL: 64 mg/dL — AB (ref 0–40)

## 2015-05-30 LAB — CBC
HCT: 39.9 % — ABNORMAL LOW (ref 40.0–52.0)
HEMATOCRIT: 38 % — AB (ref 40.0–52.0)
HEMOGLOBIN: 12.8 g/dL — AB (ref 13.0–18.0)
Hemoglobin: 13.3 g/dL (ref 13.0–18.0)
MCH: 30.7 pg (ref 26.0–34.0)
MCH: 31 pg (ref 26.0–34.0)
MCHC: 33.6 g/dL (ref 32.0–36.0)
MCHC: 33.3 g/dL (ref 32.0–36.0)
MCV: 91.3 fL (ref 80.0–100.0)
MCV: 93 fL (ref 80.0–100.0)
PLATELETS: 174 10*3/uL (ref 150–440)
Platelets: 181 10*3/uL (ref 150–440)
RBC: 4.16 MIL/uL — AB (ref 4.40–5.90)
RBC: 4.29 MIL/uL — ABNORMAL LOW (ref 4.40–5.90)
RDW: 13.7 % (ref 11.5–14.5)
RDW: 13.9 % (ref 11.5–14.5)
WBC: 9.4 10*3/uL (ref 3.8–10.6)
WBC: 10.4 10*3/uL (ref 3.8–10.6)

## 2015-05-30 LAB — CREATININE, SERUM
Creatinine, Ser: 1.68 mg/dL — ABNORMAL HIGH (ref 0.61–1.24)
GFR calc non Af Amer: 38 mL/min — ABNORMAL LOW (ref >60)
GFR, EST AFRICAN AMERICAN: 45 mL/min — AB (ref >60)

## 2015-05-30 LAB — COMPREHENSIVE METABOLIC PANEL
ALK PHOS: 57 U/L (ref 38–126)
ALT: 14 U/L — ABNORMAL LOW (ref 17–63)
ANION GAP: 7 (ref 5–15)
AST: 17 U/L (ref 15–41)
Albumin: 3.9 g/dL (ref 3.5–5.0)
BILIRUBIN TOTAL: 0.7 mg/dL (ref 0.3–1.2)
BUN: 42 mg/dL — ABNORMAL HIGH (ref 6–20)
CALCIUM: 9.5 mg/dL (ref 8.9–10.3)
CO2: 27 mmol/L (ref 22–32)
Chloride: 100 mmol/L — ABNORMAL LOW (ref 101–111)
Creatinine, Ser: 1.8 mg/dL — ABNORMAL HIGH (ref 0.61–1.24)
GFR calc non Af Amer: 35 mL/min — ABNORMAL LOW (ref >60)
GFR, EST AFRICAN AMERICAN: 41 mL/min — AB (ref >60)
Glucose, Bld: 183 mg/dL — ABNORMAL HIGH (ref 65–99)
POTASSIUM: 4.7 mmol/L (ref 3.5–5.1)
SODIUM: 134 mmol/L — AB (ref 135–145)
TOTAL PROTEIN: 7.5 g/dL (ref 6.5–8.1)

## 2015-05-30 LAB — TSH: TSH: 1.97 u[IU]/mL (ref 0.350–4.500)

## 2015-05-30 MED ORDER — URELLE 81 MG PO TABS
1.0000 | ORAL_TABLET | Freq: Four times a day (QID) | ORAL | Status: DC | PRN
  Filled 2015-05-30: qty 1

## 2015-05-30 MED ORDER — CARVEDILOL 3.125 MG PO TABS
12.5000 mg | ORAL_TABLET | Freq: Two times a day (BID) | ORAL | Status: DC
  Administered 2015-05-31: 09:00:00 12.5 mg via ORAL
  Filled 2015-05-30: qty 4

## 2015-05-30 MED ORDER — OXYCODONE HCL 5 MG PO TABS: 10.0000 mg | ORAL_TABLET | Freq: Four times a day (QID) | ORAL | Status: DC | PRN

## 2015-05-30 MED ORDER — HEPARIN SODIUM (PORCINE) 5000 UNIT/ML IJ SOLN
5000.0000 [IU] | Freq: Three times a day (TID) | INTRAMUSCULAR | Status: DC
  Administered 2015-05-30 – 2015-05-31 (×3): 5000 [IU] via SUBCUTANEOUS
  Filled 2015-05-30 (×3): qty 1

## 2015-05-30 MED ORDER — ASPIRIN EC 81 MG PO TBEC
81.0000 mg | DELAYED_RELEASE_TABLET | ORAL | Status: DC
  Administered 2015-05-31: 81 mg via ORAL
  Filled 2015-05-30: qty 1

## 2015-05-30 MED ORDER — ATORVASTATIN CALCIUM 20 MG PO TABS
40.0000 mg | ORAL_TABLET | Freq: Every day | ORAL | Status: DC
Start: 2015-05-30 — End: 2015-05-31
  Administered 2015-05-30: 23:00:00 40 mg via ORAL
  Filled 2015-05-30: qty 2

## 2015-05-30 MED ORDER — INSULIN DETEMIR 100 UNIT/ML ~~LOC~~ SOLN
30.0000 [IU] | Freq: Every day | SUBCUTANEOUS | Status: DC
  Administered 2015-05-30: 23:00:00 30 [IU] via SUBCUTANEOUS
  Filled 2015-05-30 (×2): qty 0.3

## 2015-05-30 MED ORDER — VITAMIN D (ERGOCALCIFEROL) 1.25 MG (50000 UNIT) PO CAPS
50000.0000 [IU] | ORAL_CAPSULE | ORAL | Status: DC
  Filled 2015-05-30: qty 1

## 2015-05-30 MED ORDER — ASPIRIN 325 MG PO TABS
325.0000 mg | ORAL_TABLET | Freq: Every day | ORAL | Status: DC
  Administered 2015-05-30 – 2015-05-31 (×2): 325 mg via ORAL
  Filled 2015-05-30 (×2): qty 1

## 2015-05-30 MED ORDER — INSULIN ASPART 100 UNIT/ML ~~LOC~~ SOLN
0.0000 [IU] | Freq: Three times a day (TID) | SUBCUTANEOUS | Status: DC
  Administered 2015-05-31: 3 [IU] via SUBCUTANEOUS
  Filled 2015-05-30: qty 3

## 2015-05-30 MED ORDER — TAMSULOSIN HCL 0.4 MG PO CAPS
0.4000 mg | ORAL_CAPSULE | Freq: Every day | ORAL | Status: DC
  Administered 2015-05-31: 09:00:00 0.4 mg via ORAL
  Filled 2015-05-30 (×2): qty 1

## 2015-05-30 NOTE — H&P (Signed)
[place] [facility] [place] NAME: Jacob Buck    MR#:  [id number]  DATE OF BIRTH:  03-11-1941  DATE OF ADMISSION:  05/30/2015  PRIMARY CARE PHYSICIAN: Rubbie Battiest, NP   REQUESTING/REFERRING PHYSICIAN: Archie Balboa  CHIEF COMPLAINT:   Chief Complaint  Patient presents with  . Altered Mental Status    HISTORY OF PRESENT ILLNESS: Jacob Buck  is a 74 y.o. male with a known history of diabetes, colon cancer, hypertension, hyperlipidemia- had some speech disturbance in March and then twice since then noted and he was brought to emergency room but every time he is being discharged from ER as nothing else was found. Today in the morning he did not wake up so daughter went in his room and call for him usually is very good at waking up by even a little opening the door was somebody calling but today he did not wake up. Daughter started shaking him up, and it was before a while when he was finally completely awake, he denies feeling any confusion after waking up. The family noted his speech is again little bit slurred. His blood pressure was elevated after he woke up. He denies any numbness or weakness on his limbs. He denies any headache. As per the wife and patient, for last few months he has been coughing with eating food most of the time, and he also feels the food getting stuck lower and middle of his chest while eating.  PAST MEDICAL HISTORY:   Past Medical History  Diagnosis Date  . Endocrine problem 2008  . Arthritis 2008  . Diabetes mellitus without complication ([place]) [id number]  . Bowel trouble 2008  . Diverticulosis   . Cancer [facility]) 1991    Colon; right kidney ?  Marland Kitchen Hypertension   . Hyperlipidemia     PAST SURGICAL HISTORY:  Past Surgical History  Procedure Laterality Date  . Colonoscopy  2003, 2014  . Foot surgery Right 2013  . Colon surgery  2008    carcinoid  . Cystoscopy with direct vision internal urethrotomy N/A 04/28/2015     Procedure: CYSTOSCOPY WITH DIRECT VISION INTERNAL URETHROTOMY WITH HOLMIUM LASER ;  Surgeon: Royston Cowper, MD;  Location: [facility] ORS;  Service: Urology;  Laterality: N/A;    SOCIAL HISTORY:  Social History  Substance Use Topics  . Smoking status: Former Research scientist (life sciences)  . Smokeless tobacco: Never Used  . Alcohol Use: No    FAMILY HISTORY:  Family History  Problem Relation Age of Onset  . Diabetes Sister   . Alzheimer's disease Sister   . Diabetes Brother   . Heart disease Father     mild MI  . Alzheimer's disease Brother     DRUG ALLERGIES:  Allergies  Allergen Reactions  . Toujeo Solostar [Insulin Glargine] Other (See Comments)    Reaction : Numbness in arm, tingling    REVIEW OF SYSTEMS:   CONSTITUTIONAL: No fever, fatigue or weakness.  EYES: No blurred or double vision.  EARS, NOSE, AND THROAT: No tinnitus or ear pain.  RESPIRATORY: No cough, shortness of breath, wheezing or hemoptysis.  CARDIOVASCULAR: No chest pain, orthopnea, edema.  GASTROINTESTINAL: No nausea, vomiting, diarrhea or abdominal pain.  GENITOURINARY: No dysuria, hematuria.  ENDOCRINE: No polyuria, nocturia,  HEMATOLOGY: No anemia, easy bruising or bleeding SKIN: No rash or lesion. MUSCULOSKELETAL: No joint pain or arthritis. Mild speech disturbance.  NEUROLOGIC: No tingling, numbness, weakness.  PSYCHIATRY: No anxiety or depression.   MEDICATIONS  AT HOME:  Prior to Admission medications   Medication Sig Start Date End Date Taking? Authorizing Provider  carvedilol (COREG) 12.5 MG tablet Take 1 tablet (12.5 mg total) by mouth 2 (two) times daily with a meal. 09/24/14   Rubbie Battiest, NP  docusate sodium (COLACE) 100 MG capsule Take 2 capsules (200 mg total) by mouth 2 (two) times daily. 04/28/15   Royston Cowper, MD  insulin aspart (NOVOLOG FLEXPEN) 100 UNIT/ML FlexPen Inject 20 Units into the skin 2 (two) times daily before a meal. Patient not taking: Reported on 04/23/2015 06/05/14   Radhika P Phadke, MD   Insulin Pen Needle (BD PEN NEEDLE NANO U/F) 32G X 4 MM MISC Use for insulin administration three times daily 06/05/14   Haydee Monica, MD  LEVEMIR FLEXTOUCH 100 UNIT/ML Pen INJECT 42 UNITS SUBCUTANEOUSLY AT BEDTIME 04/29/15   Rubbie Battiest, NP  levofloxacin (LEVAQUIN) 500 MG tablet Take 1 tablet (500 mg total) by mouth daily. 04/28/15   Royston Cowper, MD  lisinopril (PRINIVIL,ZESTRIL) 40 MG tablet TAKE ONE TABLET BY MOUTH ONCE DAILY 02/23/15   Guadalupe Maple, MD  Meth-Hyo-M Barnett Hatter Phos-Ph Sal (URIBEL) 118 MG CAPS Take 1 capsule (118 mg total) by mouth every 6 (six) hours as needed (dysuria). 04/28/15   Royston Cowper, MD  NUCYNTA 50 MG TABS tablet Take 1 tablet (50 mg total) by mouth every 6 (six) hours as needed. 1 TO 2 TABS Q 6 HOURS PRN PAIN 04/28/15   Royston Cowper, MD  rosuvastatin (CRESTOR) 5 MG tablet Take 1 tablet (5 mg total) by mouth daily. 11/25/14   Rubbie Battiest, NP  tamsulosin (FLOMAX) 0.4 MG CAPS capsule Take 1 capsule (0.4 mg total) by mouth daily. 03/03/15   Rubbie Battiest, NP  Vitamin D, Ergocalciferol, (DRISDOL) 50000 UNITS CAPS capsule Take 1 capsule (50,000 Units total) by mouth every 7 (seven) days. Patient not taking: Reported on 04/02/2015 09/24/14   Rubbie Battiest, NP      PHYSICAL EXAMINATION:   VITAL SIGNS: Blood pressure 146/97, pulse 78, temperature 97.9 F (36.6 C), temperature source Oral, resp. rate 22, height 6\' 2"  (1.88 m), weight 113.853 kg (251 lb), SpO2 100 %.  GENERAL:  74 y.o.-year-old patient lying in the bed with no acute distress.  EYES: Pupils equal, round, reactive to light and accommodation. No scleral icterus. Extraocular muscles intact.  HEENT: Head atraumatic, normocephalic. Oropharynx and nasopharynx clear.  NECK:  Supple, no jugular venous distention. No thyroid enlargement, no tenderness.  LUNGS: Normal breath sounds bilaterally, no wheezing, rales,rhonchi or crepitation. No use of accessory muscles of respiration.  CARDIOVASCULAR: S1, S2  normal. No murmurs, rubs, or gallops.  ABDOMEN: Soft, nontender, nondistended. Bowel sounds present. No organomegaly or mass.  EXTREMITIES: No pedal edema, cyanosis, or clubbing.  NEUROLOGIC: Cranial nerves II through XII are intact. Muscle strength 5/5 in all extremities. Sensation intact. Gait not checked.  PSYCHIATRIC: The patient is alert and oriented x 3.  SKIN: No obvious rash, lesion, or ulcer.   LABORATORY PANEL:   CBC  Recent Labs Lab 05/30/15 1350  WBC 9.4  HGB 12.8*  HCT 38.0*  PLT 181  MCV 91.3  MCH 30.7  MCHC 33.6  RDW 13.7   ------------------------------------------------------------------------------------------------------------------  Chemistries   Recent Labs Lab 05/30/15 1350  NA 134*  K 4.7  CL 100*  CO2 27  GLUCOSE 183*  BUN 42*  CREATININE 1.80*  CALCIUM 9.5  AST 17  ALT 14*  ALKPHOS 57  BILITOT 0.7   ------------------------------------------------------------------------------------------------------------------ estimated creatinine clearance is 48.3 mL/min (by C-G formula based on Cr of 1.8). ------------------------------------------------------------------------------------------------------------------  Recent Labs  05/30/15 1350  TSH 1.970     Coagulation profile No results for input(s): INR, PROTIME in the last 168 hours. ------------------------------------------------------------------------------------------------------------------- No results for input(s): DDIMER in the last 72 hours. -------------------------------------------------------------------------------------------------------------------  Cardiac Enzymes No results for input(s): CKMB, TROPONINI, MYOGLOBIN in the last 168 hours.  Invalid input(s): CK ------------------------------------------------------------------------------------------------------------------ Invalid input(s):  POCBNP  ---------------------------------------------------------------------------------------------------------------  Urinalysis    Component Value Date/Time   COLORURINE YELLOW* 05/30/2015 1410   COLORURINE Yellow 02/14/2012 1445   APPEARANCEUR HAZY* 05/30/2015 1410   APPEARANCEUR Turbid 02/14/2012 1445   LABSPEC 1.015 05/30/2015 1410   LABSPEC 1.024 02/14/2012 1445   PHURINE 5.0 05/30/2015 1410   PHURINE 5.0 02/14/2012 1445   GLUCOSEU 150* 05/30/2015 1410   GLUCOSEU Negative 02/14/2012 1445   HGBUR 1+* 05/30/2015 1410   HGBUR 3+ 02/14/2012 1445   BILIRUBINUR NEGATIVE 05/30/2015 1410   BILIRUBINUR Negative 02/14/2012 1445   KETONESUR NEGATIVE 05/30/2015 1410   KETONESUR Negative 02/14/2012 1445   PROTEINUR >500* 05/30/2015 1410   PROTEINUR 100 mg/dL 02/14/2012 1445   NITRITE NEGATIVE 05/30/2015 1410   NITRITE Negative 02/14/2012 1445   LEUKOCYTESUR NEGATIVE 05/30/2015 1410   LEUKOCYTESUR Negative 02/14/2012 1445     RADIOLOGY: Dg Chest 2 View  05/30/2015  CLINICAL DATA:  75 year old male with 2 month history of nonproductive cough EXAM: CHEST  2 VIEW COMPARISON:  Prior chest x-ray 02/16/2012 FINDINGS: Stable cardiomegaly. Atherosclerotic and tortuous thoracic aorta. Stable mild central bronchitic change in chronic elevation of the right hemidiaphragm. No focal airspace consolidation, pulmonary edema, pleural effusion or pneumothorax. No suspicious pulmonary mass or nodule. No acute osseous abnormality. Multilevel degenerative change in the thoracic spine. IMPRESSION: Stable mild cardiomegaly and aortic atherosclerosis. No acute cardiopulmonary process. Electronically Signed   By: Jacqulynn Cadet M.D.   On: 05/30/2015 14:38   Ct Head Wo Contrast  05/30/2015  CLINICAL DATA:  Altered mental status, somnolence, speech changes began several months ago, hypertension today EXAM: CT HEAD WITHOUT CONTRAST TECHNIQUE: Contiguous axial images were obtained from the base of the skull  through the vertex without intravenous contrast. COMPARISON:  08/25/2014 FINDINGS: Mild she age-related cortical atrophy. Low-attenuation posterior medial right occipital lobe measuring approximately 2 x 1 cm involving cortex and subcortical white matter. This is new from the prior study. Mild diffuse low attenuation throughout the white matter similar from prior study. Mild basal ganglia calcification bilaterally, stable. No hemorrhage or extra-axial fluid. Calvarium intact. Extensive intracranial internal carotid artery calcification. IMPRESSION: New focus of low attenuation right occipital lobe, not seen on 08/25/2014. This appears most consistent with subacute to chronic infarction in this area. Consider MRI to better characterize chronicity of this process as well as to exclude the possibility of underlying mass. Electronically Signed   By: Skipper Cliche M.D.   On: 05/30/2015 14:53     IMPRESSION AND PLAN:  * TIA  CT of the head shows possible subacute infarct  We'll the medical floor, MRI, echocardiogram, carotid Doppler study.  Continue aspirin and statin.  Check lipid profile.  * Complain of dysphagia  Says he is coughing with eating for last few months  I will get speech and swallow evaluation, he may need barium swallow study, we may get it done as outpatient also.  * Diabetes  Continue his Lantus but with decreased O2 sat and keep him on insulin sliding  scale coverage.  * Hypertension  With the possibility of stroke we will allow permissive hypertension so hold his lisinopril for now continue carvedilol.  * Lipidemia  Lipid panel, continue statin.  All the records are reviewed and case discussed with ED provider. Management plans discussed with the patient, family and they are in agreement.  CODE STATUS: Full code   TOTAL TIME TAKING CARE OF THIS PATIENT: 50 minutes.    Vaughan Basta M.D on 05/30/2015   Between 7am to 6pm - Pager - [phone]  After 6pm  go to [email or web address] - password [email or web address] [place] [facility]  Office  [phone]  CC: Primary care physician; Rubbie Battiest, NP   Note: This dictation was prepared with Dragon dictation along with smaller phrase technology. Any transcriptional errors that result from this process are unintentional.

## 2015-05-30 NOTE — ED Notes (Signed)
Pt taken to US

## 2015-05-30 NOTE — ED Notes (Signed)
Wife and daughter to bedside at this time. She reports pt has not been well since yesterday. She states he sleep all day yesterday and was hard to arouse this evening when her daughter went to check on him. Pt alert and oriented at this time. Wife also reports pt has had a cough for months. Wife is concerned because pt coughs "so hard he is SOB".

## 2015-05-30 NOTE — ED Notes (Addendum)
Pt BIB EMS from home. Pts daughter tried to wake up pt this morning and stated that it was harder to wake him up than normal. Pt a+ox4 at this time. Pt has speech changes that started in March. Pt reports numbness in both legs. Pt has history of neuropathy. Pt stated that his blood pressure was elevated this morning. Pt is diabetic and sts he has not taken insulin in 2 months. Blood sugar 221 per EMS. Pt denies pain and sts "he feels fine"

## 2015-05-30 NOTE — ED Provider Notes (Signed)
[facility] Emergency Department Provider Note   ____________________________________________  Time seen: 1425  I have reviewed the triage vital signs and the nursing notes.   HISTORY  Chief Complaint Altered Mental Status   History limited by: Not Limited, some history obtained from family.   HPI Jacob Buck is a 74 y.o. male who presents to the emergency today coming in by family because of concerns for increased energy and fatigue. Family states that the patient has not had his same energy for a number of months now. Spends most of the day in bed. They state that yesterday and today however they were having a harder time waking him up. They state that in addition they've noticed that his speech has been slurred for roughly half a year. It sounds like the patient has been evaluated for this and has had neuro imaging done previously. The patient himself denies any fevers, chest pain, shortness of breath, nausea or vomiting.   Past Medical History  Diagnosis Date  . Endocrine problem 2008  . Arthritis 2008  . Diabetes mellitus without complication ([place]) [id number]  . Bowel trouble 2008  . Diverticulosis   . Cancer [facility]) 1991    Colon; right kidney ?  Marland Kitchen Hypertension     Patient Active Problem List   Diagnosis Date Noted  . Non-compliant behavior 12/09/2014  . Encounter to establish care 10/01/2014  . Type 2 diabetes, uncontrolled, with renal manifestation ([place]) 06/05/2014  . CKD stage 3 due to type 2 diabetes mellitus ([place]) 06/05/2014  . Diabetic retinopathy ([place]) 06/05/2014  . Diabetic neuropathy ([place]) 06/05/2014  . Status post amputation of lesser toe of right foot ([place]) 06/05/2014  . Hypercholesterolemia with hypertriglyceridemia 06/05/2014  . Essential hypertension, benign 06/05/2014  . History of colon cancer 06/05/2014  . Rectal bleeding 09/18/2013  . Personal history of colon cancer 10/02/2012    Past Surgical History  Procedure Laterality  Date  . Colonoscopy  2003, 2014  . Foot surgery Right 2013  . Colon surgery  2008    carcinoid  . Cystoscopy with direct vision internal urethrotomy N/A 04/28/2015    Procedure: CYSTOSCOPY WITH DIRECT VISION INTERNAL URETHROTOMY WITH HOLMIUM LASER ;  Surgeon: Royston Cowper, MD;  Location: [facility] ORS;  Service: Urology;  Laterality: N/A;    Current Outpatient Rx  Name  Route  Sig  Dispense  Refill  . carvedilol (COREG) 12.5 MG tablet   Oral   Take 1 tablet (12.5 mg total) by mouth 2 (two) times daily with a meal.   60 tablet   2   . docusate sodium (COLACE) 100 MG capsule   Oral   Take 2 capsules (200 mg total) by mouth 2 (two) times daily.   120 capsule   3   . insulin aspart (NOVOLOG FLEXPEN) 100 UNIT/ML FlexPen   Subcutaneous   Inject 20 Units into the skin 2 (two) times daily before a meal. Patient not taking: Reported on 04/23/2015   15 mL   4   . Insulin Pen Needle (BD PEN NEEDLE NANO U/F) 32G X 4 MM MISC      Use for insulin administration three times daily   100 each   11   . LEVEMIR FLEXTOUCH 100 UNIT/ML Pen      INJECT 42 UNITS SUBCUTANEOUSLY AT BEDTIME   15 mL   5   . levofloxacin (LEVAQUIN) 500 MG tablet   Oral   Take 1 tablet (500 mg total) by mouth  daily.   7 tablet   0   . lisinopril (PRINIVIL,ZESTRIL) 40 MG tablet      TAKE ONE TABLET BY MOUTH ONCE DAILY   90 tablet   1   . Meth-Hyo-M Bl-Na Phos-Ph Sal (URIBEL) 118 MG CAPS   Oral   Take 1 capsule (118 mg total) by mouth every 6 (six) hours as needed (dysuria).   40 capsule   3     Dispense as written.   . NUCYNTA 50 MG TABS tablet   Oral   Take 1 tablet (50 mg total) by mouth every 6 (six) hours as needed. 1 TO 2 TABS Q 6 HOURS PRN PAIN   30 tablet   0     Dispense as written.   . rosuvastatin (CRESTOR) 5 MG tablet   Oral   Take 1 tablet (5 mg total) by mouth daily.   30 tablet   2   . tamsulosin (FLOMAX) 0.4 MG CAPS capsule   Oral   Take 1 capsule (0.4 mg total) by mouth  daily.   30 capsule   1   . Vitamin D, Ergocalciferol, (DRISDOL) 50000 UNITS CAPS capsule   Oral   Take 1 capsule (50,000 Units total) by mouth every 7 (seven) days. Patient not taking: Reported on 04/02/2015   30 capsule   1     Allergies Toujeo solostar  Family History  Problem Relation Age of Onset  . Diabetes Sister   . Alzheimer's disease Sister   . Diabetes Brother   . Heart disease Father     mild MI  . Alzheimer's disease Brother     Social History Social History  Substance Use Topics  . Smoking status: Former Research scientist (life sciences)  . Smokeless tobacco: Never Used  . Alcohol Use: No    Review of Systems  Constitutional: Negative for fever. Cardiovascular: Negative for chest pain. Respiratory: Negative for shortness of breath. Gastrointestinal: Negative for abdominal pain, vomiting and diarrhea. Genitourinary: Negative for dysuria. Musculoskeletal: Negative for back pain. Skin: Negative for rash. Neurological: Negative for headaches, focal weakness or numbness.  10-point ROS otherwise negative.  ____________________________________________   PHYSICAL EXAM:  VITAL SIGNS:    77   22   149/97 mmHg  98 %     Constitutional: Alert and oriented. Well appearing and in no distress. Eyes: Conjunctivae are normal. PERRL. Normal extraocular movements. ENT   Head: Normocephalic and atraumatic.   Nose: No congestion/rhinnorhea.   Mouth/Throat: Mucous membranes are moist.   Neck: No stridor. Hematological/Lymphatic/Immunilogical: No cervical lymphadenopathy. Cardiovascular: Normal rate, regular rhythm.  No murmurs, rubs, or gallops. Respiratory: Normal respiratory effort without tachypnea nor retractions. Breath sounds are clear and equal bilaterally. No wheezes/rales/rhonchi. Gastrointestinal: Soft and nontender. No distention.  Genitourinary: Deferred Musculoskeletal: Normal range of motion in all extremities. No joint effusions.  No lower extremity  tenderness nor edema. Neurologic:  Normal speech and language. No gross focal neurologic deficits are appreciated.  Skin:  Skin is warm, dry and intact. No rash noted. Psychiatric: Mood and affect are normal. Speech and behavior are normal. Patient exhibits appropriate insight and judgment.  ____________________________________________    LABS (pertinent positives/negatives)  WBC 9.4 Hgb 12.8 Cr 1.8   ____________________________________________   EKG  I, Nance Pear, attending physician, personally viewed and interpreted this EKG  EKG Time: 1402 Rate: 77 Rhythm: NSR Axis: right axis deviation Intervals: qtc 435 QRS: narrow ST changes: no st elevation Impression: abnormal ekg ____________________________________________    RADIOLOGY  CT  head IMPRESSION: New focus of low attenuation right occipital lobe, not seen on 08/25/2014. This appears most consistent with subacute to chronic infarction in this area. Consider MRI to better characterize chronicity of this process as well as to exclude the possibility of underlying mass.   ____________________________________________   PROCEDURES  Procedure(s) performed: None  Critical Care performed: No  ____________________________________________   INITIAL IMPRESSION / ASSESSMENT AND PLAN / ED COURSE  Pertinent labs & imaging results that were available during my care of the patient were reviewed by me and considered in my medical decision making (see chart for details).  Patient presented to the emergency department brought in by family because of concerns for increased weakness. On exam patient does have slurred speech which patient is family says is chronic. Given the clinical story head CT was ordered which did show concerns for chronic or subacute stroke. Given this finding patient will be admitted to the hospitalist service for further workup and  evaluation.  ____________________________________________   FINAL CLINICAL IMPRESSION(S) / ED DIAGNOSES  Final diagnoses:  Cerebral infarction due to unspecified mechanism     Nance Pear, MD 05/31/15 [phone]

## 2015-05-30 NOTE — Progress Notes (Addendum)
Spoke with Dr. Anselm Jungling prior to pt's arrival to floor to get orders for q 2hr neuro & check, cardiac monitoring, and stroke swallow screen.  Md gave ok to put in orders.  ED nurse Felicia to do bedside stroke swallow screen prior to pt coming to the floor.  Awaiting pt.  Clarise Cruz, RN

## 2015-05-30 NOTE — Progress Notes (Signed)
Pt arrived to floor @1855 . Cardiac monitor placed and orders released.  Dietary tray ordered before they close.  Report given to oncoming shift. Family at bedside. Clarise Cruz, RN

## 2015-05-31 ENCOUNTER — Observation Stay
Admit: 2015-05-31 | Discharge: 2015-05-31 | Disposition: A | Discharge status: Home / Self Care | Payer: Medicare Other | Attending: Internal Medicine | Admitting: Internal Medicine

## 2015-05-31 DIAGNOSIS — E785 Hyperlipidemia, unspecified: Secondary | ICD-10-CM

## 2015-05-31 DIAGNOSIS — I10 Essential (primary) hypertension: Secondary | ICD-10-CM

## 2015-05-31 DIAGNOSIS — E669 Obesity, unspecified: Secondary | ICD-10-CM

## 2015-05-31 DIAGNOSIS — D649 Anemia, unspecified: Secondary | ICD-10-CM

## 2015-05-31 DIAGNOSIS — N183 Chronic kidney disease, stage 3 unspecified: Secondary | ICD-10-CM

## 2015-05-31 DIAGNOSIS — R531 Weakness: Secondary | ICD-10-CM

## 2015-05-31 LAB — LIPID PANEL
CHOLESTEROL: 236 mg/dL — AB (ref 0–200)
HDL: 35 mg/dL — ABNORMAL LOW (ref >40)
LDL Cholesterol: 131 mg/dL — ABNORMAL HIGH (ref 0–99)
Total CHOL/HDL Ratio: 6.7 RATIO
Triglycerides: 352 mg/dL — ABNORMAL HIGH (ref <150)
VLDL: 70 mg/dL — AB (ref 0–40)

## 2015-05-31 LAB — BASIC METABOLIC PANEL
ANION GAP: 7 (ref 5–15)
BUN: 39 mg/dL — ABNORMAL HIGH (ref 6–20)
CHLORIDE: 102 mmol/L (ref 101–111)
CO2: 27 mmol/L (ref 22–32)
Calcium: 9.1 mg/dL (ref 8.9–10.3)
Creatinine, Ser: 1.68 mg/dL — ABNORMAL HIGH (ref 0.61–1.24)
GFR calc non Af Amer: 38 mL/min — ABNORMAL LOW (ref >60)
GFR, EST AFRICAN AMERICAN: 45 mL/min — AB (ref >60)
Glucose, Bld: 106 mg/dL — ABNORMAL HIGH (ref 65–99)
Potassium: 4 mmol/L (ref 3.5–5.1)
Sodium: 136 mmol/L (ref 135–145)

## 2015-05-31 LAB — GLUCOSE, CAPILLARY
Glucose-Capillary: 105 mg/dL — ABNORMAL HIGH (ref 65–99)
Glucose-Capillary: 204 mg/dL — ABNORMAL HIGH (ref 65–99)
Glucose-Capillary: 220 mg/dL — ABNORMAL HIGH (ref 65–99)

## 2015-05-31 LAB — CBC
HEMATOCRIT: 37.1 % — AB (ref 40.0–52.0)
HEMOGLOBIN: 12.1 g/dL — AB (ref 13.0–18.0)
MCH: 30.1 pg (ref 26.0–34.0)
MCHC: 32.6 g/dL (ref 32.0–36.0)
MCV: 92.5 fL (ref 80.0–100.0)
Platelets: 175 10*3/uL (ref 150–440)
RBC: 4.01 MIL/uL — AB (ref 4.40–5.90)
RDW: 13.7 % (ref 11.5–14.5)
WBC: 7.8 10*3/uL (ref 3.8–10.6)

## 2015-05-31 LAB — HEMOGLOBIN A1C
HEMOGLOBIN A1C: 7.6 % — AB (ref 4.0–6.0)
Hgb A1c MFr Bld: 7.7 % — ABNORMAL HIGH (ref 4.0–6.0)

## 2015-05-31 LAB — TSH: TSH: 2.044 u[IU]/mL (ref 0.350–4.500)

## 2015-05-31 MED ORDER — ROSUVASTATIN CALCIUM 5 MG PO TABS: 5.0000 mg | ORAL_TABLET | Freq: Every day | ORAL | Status: DC

## 2015-05-31 MED ORDER — ASPIRIN 325 MG PO TABS: 325.0000 mg | ORAL_TABLET | Freq: Every day | ORAL | Status: DC

## 2015-05-31 MED ORDER — INSULIN DETEMIR 100 UNIT/ML FLEXPEN: 22.0000 [IU] | PEN_INJECTOR | Freq: Two times a day (BID) | SUBCUTANEOUS | Status: DC

## 2015-05-31 NOTE — Evaluation (Signed)
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  He has some fatigue and coordination/cadence issues with ambulation, but reports that he feels safe and able to go home with assist from his family.  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  Pt would benefit from PT at home.     Follow Up Recommendations Home health PT    Equipment Recommendations       Recommendations for Other Services       Precautions / Restrictions Precautions Precautions: Fall Restrictions Weight Bearing Restrictions: No      Mobility  Bed Mobility Overal bed mobility: Independent;Modified Independent             General bed mobility comments: Pt needing only minimal assist with cuing/LEs  Transfers Overall transfer level: Modified independent Equipment used: Rolling walker (2 wheeled)             General transfer comment: Pt needing to use the back of his legs to leverage up to standing, but does so with only CGA.  Ambulation/Gait Ambulation/Gait assistance: Min guard Ambulation Distance (Feet): 50 Feet Assistive device: Rolling walker (2 wheeled)       General Gait Details: Pt with poor ankle control (minimal DF clearance) and appears to rely heavily on the walker and lock out knees to bear weight effectively.  Stairs Stairs: Yes Stairs assistance: Min guard Stair Management: Two rails Number of Stairs: 6 General stair comments: Pt leading up with L, reliant on the rails, little ankle DF control b/l   Wheelchair Mobility    Modified Rankin (Stroke Patients Only)       Balance                                             Pertinent Vitals/Pain  Pain Assessment: No/denies pain    Home Living Family/patient expects to be discharged to:: Private residence Living Arrangements: Spouse/significant other Available Help at Discharge: Family   Home Access: Stairs to enter Entrance Stairs-Rails: Can reach Buck Entrance Stairs-Number of Steps: 3   Home Equipment: Environmental consultant - 2 wheels      Prior Function Level of Independence: Independent with assistive device(s)               Hand Dominance        Extremity/Trunk Assessment   Upper Extremity Assessment: Overall WFL for tasks assessed           Lower Extremity Assessment:  (b/l LEs weaker more distally secondary to neuropathy)         Communication   Communication: No difficulties  Cognition Arousal/Alertness: Awake/alert Behavior During Therapy: WFL for tasks assessed/performed Overall Cognitive Status: Within Functional Limits for tasks assessed                      General Comments      Exercises        Assessment/Plan    PT Assessment Patient needs continued PT services  PT Diagnosis Difficulty walking;Generalized weakness   PT Problem List Decreased strength;Decreased range of motion;Decreased activity tolerance;Decreased  balance;Decreased mobility;Decreased coordination;Decreased cognition;Decreased knowledge of use of DME;Decreased safety awareness  PT Treatment Interventions DME instruction;Gait training;Stair training;Therapeutic activities;Functional mobility training;Therapeutic exercise;Balance training   PT Goals (Current goals can be found in the Care Plan section) Acute Rehab PT Goals Patient Stated Goal: go home PT Goal Formulation: With patient/family Time For Goal Achievement: 06/14/15 Potential to Achieve Goals: Good    Frequency Min 2X/week   Barriers to discharge        Co-evaluation               End of Session Equipment Utilized During Treatment: Gait belt Activity Tolerance: Patient limited by  fatigue Patient left: with chair alarm set;with call bell/phone within reach      Functional Assessment Tool Used: clinical judgement Functional Limitation: Mobility: Walking and moving around Mobility: Walking and Moving Around Current Status [id number]): At least 20 percent but less than 40 percent impaired, limited or restricted Mobility: Walking and Moving Around Goal Status [phone]): At least 1 percent but less than 20 percent impaired, limited or restricted    Time: [id number] PT Time Calculation (min) (ACUTE ONLY): 13 min   Charges:   PT Evaluation $Initial PT Evaluation Tier I: 1 Procedure     PT G Codes:   PT G-Codes **NOT FOR INPATIENT CLASS** Functional Assessment Tool Used: clinical judgement Functional Limitation: Mobility: Walking and moving around Mobility: Walking and Moving Around Current Status [id number]): At least 20 percent but less than 40 percent impaired, limited or restricted Mobility: Walking and Moving Around Goal Status [phone]): At least 1 percent but less than 20 percent impaired, limited or restricted   Jacob Buck, PT, DPT [phone]  Jacob Buck 05/31/2015, 12:02 PM

## 2015-05-31 NOTE — Discharge Summary (Signed)
[facility] [place] NAME: Jacob Buck    MR#:  [id number]  DATE OF BIRTH:  Aug 17, 1940  DATE OF ADMISSION:  05/30/2015 ADMITTING PHYSICIAN: Vaughan Basta, MD  DATE OF DISCHARGE: No discharge date for patient encounter.  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 Endocrine problem 2008  . Arthritis 2008  . Diabetes mellitus without complication ([place]) [id number]  . Bowel trouble 2008  . Diverticulosis   . 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  Marland Kitchen Hypertension   . Hyperlipidemia     .pro HOSPITAL COURSE:   Patient is 74 year old African-American male with past history significant for history of diabetes, hypertension, hyperlipidemia, obesity who presents with altered mental status. Apparently patient's daughter was not able to wake him up earlier today, he speech was noted to be slowly. On arrival to emergency room, he had CT scan of his head which revealed right thalamic stroke. Patient was admitted to the hospital for further evaluation and treatment. He admitted not taking his aspirin or Crestor daily, but just as needed. Patient had MRI of the brain done which showed acute to early subacute right thalamic infarct, also chronic left thalamic infarct late subacute to chronic right occipital infarct. Ultrasound of carotid arteries revealed mild from 1-49 9% stenosis of proximal right internal carotid artery and proximal left internal  carotid artery. Vertebral arteries were patent with normal antegrade flow. Echocardiogram was performed and it was found to be abnormal with ejection fraction of 35-40%, aortic valve area was 1.65 cm. Patient had no signs or symptoms of aspiration with food intake. He was seen by physical therapist and recommended home health services. It was felt that patient is stable to be discharged home with primary care physicians as well as cardiology follow-up as outpatient. I discussed patient's case with neurologist, who felt that patient should be on full dose of aspirin for at least 5-6 months and Crestor daily, which was communicated to patient upon discharge. Discussion by problem 1. Acute right-sided thalamic stroke, continue full dose of aspirin, Crestor, prolonged discussion with patient earlier today about the risks and benefits. Patient's LDL was found to be 131, triglycerides were 352 and total cholesterol of 236. It is recommended to follow patient's lipid panel as outpatient and advanced medications as needed. Carotid ultrasound was remarkable for mild bilateral internal carotid artery stenosis, which she is to be followed by vascular surgery as outpatient. Echocardiogram revealed ejection fraction of 35-40%, patient would benefit from cardiologist. A follow-up and evaluation. Patient may benefit from sleep study as outpatient to rule out obstructive sleep apnea due to recurrent strokes seen in thalamic region 2. Hyperlipidemia. Continue Crestor, Patient was advised about diet, he would benefit from fenofibrate , omega fatty acids  3. 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 Chronic  renal insufficiency, stage III , patient would benefit from a nephrologist. Follow-up as outpatient , seems to be stable  5.  Obesity , patient would benefit from obstructive sleep apnea evaluation in sleep lab , patient's cholesterol is markedly elevated , he was advised to continue Crestor daily , he was advised about diet , he may need to have fenofibrate added to his regimen  6. 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 Toujeo Solostar [Insulin Glargine] Other (See Comments)    Reaction : Numbness in arm, tingling    DISCHARGE MEDICATIONS:   Current Discharge Medication List    START taking these medications   Details  aspirin 325 MG tablet Take 1 tablet (325 mg total) by mouth daily. Qty: 30 tablet, Refills: 5      CONTINUE these medications which have CHANGED   Details  rosuvastatin (CRESTOR) 5 MG tablet Take 1 tablet (5 mg total) by mouth daily. Qty: 30 tablet, Refills: 5      CONTINUE these medications which have NOT CHANGED   Details  carvedilol (COREG) 12.5 MG tablet Take 1 tablet (12.5 mg total) by mouth 2 (two) times daily with a meal. Qty: 60 tablet, Refills: 2    Insulin Pen Needle (BD PEN NEEDLE NANO U/F) 32G X 4 MM MISC Use for insulin administration three times daily Qty: 100 each, Refills: 11   Associated Diagnoses: Type 2 diabetes, uncontrolled, with renal manifestation (HCC)    LEVEMIR FLEXTOUCH 100 UNIT/ML Pen INJECT 42 UNITS SUBCUTANEOUSLY AT BEDTIME Qty: 15 mL, Refills: 5    lisinopril (PRINIVIL,ZESTRIL) 40 MG tablet TAKE ONE TABLET BY MOUTH ONCE DAILY Qty: 90 tablet, Refills: 1    tamsulosin (FLOMAX) 0.4 MG CAPS capsule Take 1 capsule (0.4 mg total) by mouth daily. Qty: 30 capsule, Refills: 1    docusate sodium (COLACE) 100 MG capsule Take 2 capsules (200 mg total) by mouth 2 (two) times daily. Qty: 120 capsule, Refills: 3    insulin aspart (NOVOLOG FLEXPEN) 100 UNIT/ML FlexPen Inject 20 Units  into the skin 2 (two) times daily before a meal. Qty: 15 mL, Refills: 4   Associated Diagnoses: Type 2 diabetes, uncontrolled, with renal manifestation (HCC)    levofloxacin (LEVAQUIN) 500 MG tablet Take 1 tablet (500 mg total) by mouth daily. Qty: 7 tablet, Refills: 0    Meth-Hyo-M Bl-Na Phos-Ph Sal (URIBEL) 118 MG CAPS Take 1 capsule (118 mg total) by mouth every 6 (six) hours as needed (dysuria). Qty: 40 capsule, Refills: 3    NUCYNTA 50 MG TABS tablet Take 1 tablet (50 mg total) by mouth every 6 (six) hours as needed. 1 TO 2 TABS Q 6 HOURS PRN PAIN Qty: 30 tablet, Refills: 0    Vitamin D, Ergocalciferol, (DRISDOL) 50000 UNITS CAPS capsule Take 1 capsule (50,000 Units total) by mouth every 7 (seven) days. Qty: 30 capsule, Refills: 1      STOP taking these medications     aspirin EC 81 MG tablet          DISCHARGE INSTRUCTIONS:    Patient is to follow-up with primary care physician as outpatient , he would benefit from a sleep lab study to rule out obstructive sleep apnea , he would benefit from lifestyle. Follow-up as outpatient   If you experience worsening of your admission symptoms, develop shortness of  breath, life threatening emergency, suicidal or homicidal thoughts you must seek medical attention immediately by calling 911 or calling your MD immediately  if symptoms less severe.  You Must read complete instructions/literature along with all the possible adverse reactions/side effects for all the Medicines you take and that have been prescribed to you. Take any new Medicines after you have completely understood and accept all the possible adverse reactions/side effects.   Please note  You were cared for by a hospitalist during your hospital stay. If you have any questions about your discharge medications or the care you received while you were in the hospital after you are discharged, you can call the unit and asked to speak with the hospitalist on call if the hospitalist  that took care of you is not available. Once you are discharged, your primary care physician will handle any further medical issues. Please note that NO REFILLS for any discharge medications will be authorized once you are discharged, as it is imperative that you return to your primary care physician (or establish a relationship with a primary care physician if you do not have one) for your aftercare needs so that they can reassess your need for medications and monitor your lab values.    Today   CHIEF COMPLAINT:   Chief Complaint  Patient presents with  . Altered Mental Status    HISTORY OF PRESENT ILLNESS:  Jacob Buck  is a 74 y.o. male with a known history of diabetes, hypertension, hyperlipidemia, obesity who presents with altered mental status. Apparently patient's daughter was not able to wake him up earlier today, he speech was noted to be slowly. On arrival to emergency room, he had CT scan of his head which revealed right thalamic stroke. Patient was admitted to the hospital for further evaluation and treatment. He admitted not taking his aspirin or Crestor daily, but just as needed. Patient had MRI of the brain done which showed acute to early subacute right thalamic infarct, also chronic left thalamic infarct late subacute to chronic right occipital infarct. Ultrasound of carotid arteries revealed mild from 1-49 9% stenosis of proximal right internal carotid artery and proximal left internal carotid artery. Vertebral arteries were patent with normal antegrade flow. Echocardiogram was performed and it was found to be abnormal with ejection fraction of 35-40%, aortic valve area was 1.65 cm. Patient had no signs or symptoms of aspiration with food intake. He was seen by physical therapist and recommended home health services. It was felt that patient is stable to be discharged home with primary care physicians as well as cardiology follow-up as outpatient. I discussed patient's case with  neurologist, who felt that patient should be on full dose of aspirin for at least 5-6 months and Crestor daily, which was communicated to patient upon discharge. Discussion by problem 1. Acute right-sided thalamic stroke, continue full dose of aspirin, Crestor, prolonged discussion with patient earlier today about the risks and benefits. Patient's LDL was found to be 131, triglycerides were 352 and total cholesterol of 236. It is recommended to follow patient's lipid panel as outpatient and advanced medications as needed. Carotid ultrasound was remarkable for mild bilateral internal carotid artery stenosis, which she is to be followed by vascular surgery as outpatient. Echocardiogram revealed ejection fraction of 35-40%, patient would benefit from cardiologist. A follow-up and evaluation. Patient may benefit from sleep study as outpatient to rule out obstructive sleep apnea due to recurrent strokes seen in thalamic region 2. Hyperlipidemia. Continue Crestor, Patient was  advised about diet, he would benefit from fenofibrate , omega fatty acids  3. 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 Chronic renal insufficiency, stage III , patient would benefit from a nephrologist. Follow-up as outpatient , seems to be stable  5. Obesity , patient would benefit from obstructive sleep apnea evaluation in sleep lab , patient's cholesterol is markedly elevated , he was advised to continue Crestor daily , he was advised about diet , he may need to have fenofibrate added to his regimen  6. 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 rate 18, height 6\' 2"  (1.88 m), weight 113.853 kg (251  lb), SpO2 99 %.  I/O:   Intake/Output Summary (Last 24 hours) at 05/31/15 1534 Last data filed at 05/31/15 1200  Gross per 24 hour  Intake    480 ml  Output   1100 ml  Net   -620 ml    PHYSICAL EXAMINATION:  GENERAL:  74 y.o.-year-old patient lying in the bed with no acute distress.  EYES: Pupils equal, round, reactive to light and accommodation. No scleral icterus. Extraocular muscles intact.  HEENT: Head atraumatic, normocephalic. Oropharynx and nasopharynx clear.  NECK:  Supple, no jugular venous distention. No thyroid enlargement, no tenderness.  LUNGS: Normal breath sounds bilaterally, no wheezing, rales,rhonchi or crepitation. No use of accessory muscles of respiration.  CARDIOVASCULAR: S1, S2 normal. No murmurs, rubs, or gallops.  ABDOMEN: Soft, non-tender, non-distended. Bowel sounds present. No organomegaly or mass.  EXTREMITIES: No pedal edema, cyanosis, or clubbing.  NEUROLOGIC: Cranial nerves II through XII are intact. Muscle strength 5/5 in all extremities. Sensation intact. Gait not checked.  PSYCHIATRIC: The patient is alert and oriented x 3.  SKIN: No obvious rash, lesion, or ulcer.   DATA REVIEW:   CBC  Recent Labs Lab 05/31/15 0629  WBC 7.8  HGB 12.1*  HCT 37.1*  PLT 175    Chemistries   Recent Labs Lab 05/30/15 1350  05/31/15 0629  NA 134*  --  136  K 4.7  --  4.0  CL 100*  --  102  CO2 27  --  27  GLUCOSE 183*  --  106*  BUN 42*  --  39*  CREATININE 1.80*  < > 1.68*  CALCIUM 9.5  --  9.1  AST 17  --   --   ALT 14*  --   --   ALKPHOS 57  --   --   BILITOT 0.7  --   --   < > = values in this interval not displayed.  Cardiac Enzymes No results for input(s): TROPONINI in the last 168 hours.  Microbiology Results  Results for orders placed or performed during the hospital encounter of 03/12/15  Urine culture     Status: None   Collection Time: 03/12/15  7:39 AM  Result Value Ref Range Status   Specimen Description URINE, RANDOM  Final    Special Requests NONE  Final   Culture >=100,000 COLONIES/mL ENTEROCOCCUS FAECALIS  Final   Report Status 03/17/2015 FINAL  Final   Organism ID, Bacteria ENTEROCOCCUS FAECALIS  Final      Susceptibility   Enterococcus faecalis - MIC*    AMPICILLIN <=  2 SENSITIVE Sensitive     LINEZOLID 2 SENSITIVE Sensitive     CIPROFLOXACIN Value in next row Sensitive      SENSITIVE<=0.5    LEVOFLOXACIN Value in next row Sensitive      SENSITIVE0.5    NITROFURANTOIN Value in next row Sensitive      SENSITIVE<=16    TETRACYCLINE Value in next row Intermediate      INTERMEDIATE8    * >=100,000 COLONIES/mL ENTEROCOCCUS FAECALIS    RADIOLOGY:  Dg Chest 2 View  05/30/2015  CLINICAL DATA:  74 year old male with 2 month history of nonproductive cough EXAM: CHEST  2 VIEW COMPARISON:  Prior chest x-ray 02/16/2012 FINDINGS: Stable cardiomegaly. Atherosclerotic and tortuous thoracic aorta. Stable mild central bronchitic change in chronic elevation of the right hemidiaphragm. No focal airspace consolidation, pulmonary edema, pleural effusion or pneumothorax. No suspicious pulmonary mass or nodule. No acute osseous abnormality. Multilevel degenerative change in the thoracic spine. IMPRESSION: Stable mild cardiomegaly and aortic atherosclerosis. No acute cardiopulmonary process. Electronically Signed   By: Jacqulynn Cadet M.D.   On: 05/30/2015 14:38   Ct Head Wo Contrast  05/30/2015  CLINICAL DATA:  Altered mental status, somnolence, speech changes began several months ago, hypertension today EXAM: CT HEAD WITHOUT CONTRAST TECHNIQUE: Contiguous axial images were obtained from the base of the skull through the vertex without intravenous contrast. COMPARISON:  08/25/2014 FINDINGS: Mild she age-related cortical atrophy. Low-attenuation posterior medial right occipital lobe measuring approximately 2 x 1 cm involving cortex and subcortical white matter. This is new from the prior study. Mild diffuse low attenuation  throughout the white matter similar from prior study. Mild basal ganglia calcification bilaterally, stable. No hemorrhage or extra-axial fluid. Calvarium intact. Extensive intracranial internal carotid artery calcification. IMPRESSION: New focus of low attenuation right occipital lobe, not seen on 08/25/2014. This appears most consistent with subacute to chronic infarction in this area. Consider MRI to better characterize chronicity of this process as well as to exclude the possibility of underlying mass. Electronically Signed   By: Skipper Cliche M.D.   On: 05/30/2015 14:53   Mr Brain Wo Contrast  05/30/2015  CLINICAL DATA:  Hypertension. Right-sided weakness since March. Prior stroke. EXAM: MRI HEAD WITHOUT CONTRAST TECHNIQUE: Multiplanar, multiecho pulse sequences of the brain and surrounding structures were obtained without intravenous contrast. COMPARISON:  Head CT 05/30/2015 FINDINGS: There is a small acute to early subacute infarct in the right thalamus. A late subacute to chronic cortical infarct is present in the right occipital lobe with small amount of chronic blood products. Chronic lacunar infarcts are present in the left thalamus. Foci of T2 hyperintensity in the subcortical and deep cerebral white matter elsewhere are nonspecific but compatible with mild chronic small vessel ischemic disease. There is no evidence of mass, midline shift, or extra-axial fluid collection. Ventricles and sulci are normal for age. Orbits are unremarkable. Minimal right frontal sinus mucosal thickening is noted. Mastoid air cells are clear. Major intracranial vascular flow voids are preserved. IMPRESSION: 1. Acute to early subacute right thalamic infarct. 2. Chronic left thalamic infarct. 3. Late subacute to chronic right occipital infarct. Electronically Signed   By: Logan Bores M.D.   On: 05/30/2015 18:11   US Carotid Bilateral  05/30/2015  CLINICAL DATA:  74 year old male with new onset symptoms of cerebral  vascular accident EXAM: BILATERAL CAROTID DUPLEX ULTRASOUND TECHNIQUE: Pearline Cables scale imaging, color Doppler and duplex ultrasound were performed of bilateral carotid and vertebral arteries in the neck. COMPARISON:  CT scan  of the head performed earlier today FINDINGS: Criteria: Quantification of carotid stenosis is based on velocity parameters that correlate the residual internal carotid diameter with NASCET-based stenosis levels, using the diameter of the distal internal carotid lumen as the denominator for stenosis measurement. The following velocity measurements were obtained: RIGHT ICA:  56/12 cm/sec CCA:  [id number] cm/sec SYSTOLIC ICA/CCA RATIO:  0.7 DIASTOLIC ICA/CCA RATIO:  1.0 ECA:  65 cm/sec LEFT ICA:  83/29 cm/sec CCA:  [id number] cm/sec SYSTOLIC ICA/CCA RATIO:  0.9 DIASTOLIC ICA/CCA RATIO:  1.1 ECA:  30 cm/sec RIGHT CAROTID ARTERY: Bulky heterogeneous and partially calcified atherosclerotic plaque in the distal common carotid artery extending into the proximal internal carotid artery. By peak systolic velocity criteria, the estimated stenosis remains less than 50%. RIGHT VERTEBRAL ARTERY:  Patent with normal antegrade flow. LEFT CAROTID ARTERY: Heterogeneous atherosclerotic plaque in the proximal internal carotid artery. By peak systolic velocity criteria, the estimated stenosis remains less than 50%. LEFT VERTEBRAL ARTERY:  Patent with normal antegrade flow. IMPRESSION: 1. Mild (1-49%) stenosis proximal right internal carotid artery secondary to bulky, heterogeneous and partially calcified atherosclerotic plaque. 2. Mild (1-49%) stenosis proximal left internal carotid artery secondary to heterogeneous and partially calcified atherosclerotic plaque. 3. Vertebral arteries are patent with normal antegrade flow. Signed, Criselda Peaches, MD [facility] Electronically Signed   By: Jacqulynn Cadet M.D.   On: 05/30/2015 17:38    EKG:   Orders placed or  performed during the hospital encounter of 05/30/15  . ED EKG  . ED EKG      Management plans discussed with the patient, family and they are in agreement.  CODE STATUS:     Code Status Orders        Start     Ordered   05/30/15 1906  Full code   Continuous     05/30/15 1905      TOTAL TIME TAKING CARE OF THIS PATIENT: 45 minutes.  Prolonged discussion with patient about the risks as well as benefits of his medications , all questions answered, voiced understanding  . Coordination of care time 15 minutes ,  Rollins Wrightson M.D on 05/31/2015 at 3:34 PM  Between 7am to 6pm - Pager - [phone]  After 6pm go to [email or web address] - password [email or web address] [facility]  Office  [phone]  CC: Primary care physician; Rubbie Battiest, NP

## 2015-05-31 NOTE — Progress Notes (Signed)
*  PRELIMINARY RESULTS* Echocardiogram 2D Echocardiogram has been performed.  Jacob Buck 05/31/2015, 9:17 AM

## 2015-05-31 NOTE — Care Management Note (Signed)
Case Management Note  Patient Details  Name: NAHIEM MAHONEY MRN: [id number] Date of Birth: 12-15-1940  Subjective/Objective:     Discussed discharge planning with Mr Aveni and his wife who chose [facility] to be their provider. A referral for home health PT and RN was faxed to [facility].                Action/Plan:   Expected Discharge Date:  06/01/15               Expected Discharge Plan:     In-House Referral:     Discharge planning Services     Post Acute Care Choice:    Choice offered to:     DME Arranged:    DME Agency:     HH Arranged:    [facility] Agency:     Status of Service:     Medicare Important Message Given:    Date Medicare IM Given:    Medicare IM give by:    Date Additional Medicare IM Given:    Additional Medicare Important Message give by:     If discussed at [facility] of Stay Meetings, dates discussed:    Additional Comments:  Emylee Decelle A, RN 05/31/2015, 5:23 PM

## 2015-05-31 NOTE — Plan of Care (Signed)
Problem: Education: Goal: Knowledge of disease or condition will improve Outcome: Completed/Met Date Met:  05/31/15 Reinforced education about stroke risk factors, symptoms, possible complications, and preventative measures. Pt verbalized understanding.  Problem: Self-Care: Goal: Ability to participate in self-care as condition permits will improve Outcome: Completed/Met Date Met:  05/31/15 VSS. Denies pain. No neuro changes during the shift. NIH score 0. Pt is discharged to home with home health.  Discharge instructions and educational handouts about stroke given and explained to pt. Meds to be picked up from the pharmacy.      Problem: Safety: Goal: Ability to remain free from injury will improve Outcome: Completed/Met Date Met:  05/31/15 Pt remained free of Injury.

## 2015-05-31 NOTE — Plan of Care (Signed)
Problem: Education: Goal: Knowledge of disease or condition will improve Outcome: Progressing Stroke handout booklet given to patient and family.  Education on s/s and risk factors for stroke.    Problem: Self-Care: Goal: Ability to participate in self-care as condition permits will improve Outcome: Progressing Patient scoring 1 on NIH scale.  Patient with strong flexion and grip.  Patient able to ambulate with stand-by assist for safety.  Patient able to complete ADL independently.  Problem: Safety: Goal: Ability to remain free from injury will improve Outcome: Progressing Patient on high fall risk.  Bed alarm on throughout shift.  Call bell within reach.  Patient without injury this shift.

## 2015-06-01 ENCOUNTER — Telehealth: Payer: Self-pay

## 2015-06-01 NOTE — Telephone Encounter (Signed)
Transition Care Management Follow-up Telephone Call   Date discharged? 05/31/15   How have you been since you were released from the hospital? Feeling better, and taking it easy.  Daughter Rodena Piety) is monitoring compliance with medications and around the clock care.  Home health and PT to assist soon.    Do you understand why you were in the hospital? Yes, stroke   Do you understand the discharge instructions? Yes    Where were you discharged to? Home   Items Reviewed:  Medications reviewed: Yes, started taking aspirin 325mg , stopped taking aspirin 81mg .  Continuing Crestor and all other medication as prescribed  Allergies reviewed: Yes, no change  Dietary changes reviewed: Yes, diabetic diet  Referrals reviewed: Yes, cardiology and nephrologist appointments to be made.   Functional Questionnaire:   Activities of Daily Living (ADLs):  Toileting, self feeding, grooming, dressing. He states they are independent in the following: States they require assistance with the following: Ambulating (uses walker), meal prep, bathing (completed by sponge bath). Home health and PT.   Any transportation issues/concerns?: Not at this time.   Any patient concerns? Discharge note recommends obstructive sleep apnea evaluation needed in sleep lab.  Referral need for f/u with Endocrinology.    Confirmed importance and date/time of follow-up visits scheduled Yes, appointment made 06/04/15 at 1pm.  Provider Appointment booked with Lorane Gell, NP (PCP).  Confirmed with patient if condition begins to worsen call PCP or go to the ER.  Patient was given the office number and encouraged to call back with question or concerns.  : Yes, patient's daughter Rodena Piety) verbalized understanding.

## 2015-06-01 NOTE — Telephone Encounter (Signed)
Thank you :)

## 2015-06-02 ENCOUNTER — Telehealth: Payer: Self-pay

## 2015-06-02 NOTE — Telephone Encounter (Signed)
Returned patient call from yesterday no answer or voice mail to leave

## 2015-06-03 ENCOUNTER — Other Ambulatory Visit: Payer: Self-pay | Admitting: Nurse Practitioner

## 2015-06-03 ENCOUNTER — Ambulatory Visit (INDEPENDENT_AMBULATORY_CARE_PROVIDER_SITE_OTHER): Payer: Medicare Other | Admitting: Nurse Practitioner

## 2015-06-03 VITALS — BP 110/68 | HR 76 | Temp 98.2°F | Resp 18 | Ht 74.0 in | Wt 246.8 lb

## 2015-06-03 DIAGNOSIS — G4733 Obstructive sleep apnea (adult) (pediatric): Secondary | ICD-10-CM

## 2015-06-03 DIAGNOSIS — Z89421 Acquired absence of other right toe(s): Secondary | ICD-10-CM

## 2015-06-03 DIAGNOSIS — IMO0002 Reserved for concepts with insufficient information to code with codable children: Secondary | ICD-10-CM

## 2015-06-03 DIAGNOSIS — I6322 Cerebral infarction due to unspecified occlusion or stenosis of basilar arteries: Secondary | ICD-10-CM

## 2015-06-03 DIAGNOSIS — E1122 Type 2 diabetes mellitus with diabetic chronic kidney disease: Secondary | ICD-10-CM | POA: Diagnosis not present

## 2015-06-03 DIAGNOSIS — R4689 Other symptoms and signs involving appearance and behavior: Secondary | ICD-10-CM

## 2015-06-03 DIAGNOSIS — E1142 Type 2 diabetes mellitus with diabetic polyneuropathy: Secondary | ICD-10-CM

## 2015-06-03 DIAGNOSIS — N183 Chronic kidney disease, stage 3 unspecified: Secondary | ICD-10-CM

## 2015-06-03 DIAGNOSIS — E1165 Type 2 diabetes mellitus with hyperglycemia: Secondary | ICD-10-CM

## 2015-06-03 DIAGNOSIS — E782 Mixed hyperlipidemia: Secondary | ICD-10-CM

## 2015-06-03 DIAGNOSIS — E669 Obesity, unspecified: Secondary | ICD-10-CM

## 2015-06-03 DIAGNOSIS — I6381 Other cerebral infarction due to occlusion or stenosis of small artery: Secondary | ICD-10-CM

## 2015-06-03 DIAGNOSIS — Z794 Long term (current) use of insulin: Secondary | ICD-10-CM

## 2015-06-03 DIAGNOSIS — I63211 Cerebral infarction due to unspecified occlusion or stenosis of right vertebral arteries: Secondary | ICD-10-CM

## 2015-06-03 DIAGNOSIS — E1169 Type 2 diabetes mellitus with other specified complication: Secondary | ICD-10-CM

## 2015-06-03 DIAGNOSIS — Z09 Encounter for follow-up examination after completed treatment for conditions other than malignant neoplasm: Secondary | ICD-10-CM

## 2015-06-03 NOTE — Progress Notes (Signed)
Pre visit review using our clinic review tool, if applicable. No additional management support is needed unless otherwise documented below in the visit note. 

## 2015-06-03 NOTE — Progress Notes (Signed)
Patient ID: Jacob Buck, male    DOB: 1940/11/04  Age: 74 y.o. MRN: [id number]  CC: Transient Ischemic Attack   HPI Jacob Buck presents for hospital follow up from CVA. He is accompanied by his daughter Jacob Buck.   1) Patient was seen in [facility] ED on 05/30/2015. He was discharged on 05/31/2015.  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 His speech was slurred, but worsening from baseline.  Patient reports he was not taking aspirin or Crestor daily and just when he remembered. He is now taking correctly. Imaging:  Ultrasound of carotid arteries revealed mild stenosis left and right sides. Vertebral arteries were patent and had normal flow. Ejection fraction was 35-40%.  Referrals: Patient is meeting with vascular surgery as outpatient. Unsure of referral status. Patient has not seen a nephrologist in some time. I advised daughter that he will need close follow-up with nephrology. He is set up to see cardiology at this time.  Patient is still taking Levemir 22 units twice daily. Blood sugars are still uncontrolled. Since this is unconventional we will get endocrinology on board. Saw Dr. Howell Rucks in the past  Today Weak on right side today.  Daughter Jacob Buck is helping but requests help with financial issues Home Health and PT- her coming into home. Not Started taking ASA 325 mg  History Jacob Buck has a past medical history of Endocrine problem (2008); Arthritis (2008); Diabetes mellitus without complication ([place]) ([id number]); Bowel trouble (2008); Diverticulosis; Cancer ([place]) (1991); Hypertension; and Hyperlipidemia.   He has past surgical history that includes Colonoscopy (2003, 2014); Foot surgery (Right, 2013); Colon surgery (2008); and Cystoscopy with direct vision internal urethrotomy  (N/A, 04/28/2015).   His family history includes Alzheimer's disease in his brother and sister; Diabetes in his brother and sister; Heart disease in his father.He reports that he has quit smoking. He has never used smokeless tobacco. He reports that he does not drink alcohol or use illicit drugs.  Outpatient Prescriptions Prior to Visit  Medication Sig Dispense Refill  . aspirin 325 MG tablet Take 1 tablet (325 mg total) by mouth daily. 30 tablet 5  . carvedilol (COREG) 12.5 MG tablet Take 1 tablet (12.5 mg total) by mouth 2 (two) times daily with a meal. 60 tablet 2  . docusate sodium (COLACE) 100 MG capsule Take 2 capsules (200 mg total) by mouth 2 (two) times daily. 120 capsule 3  . Insulin Detemir (LEVEMIR FLEXTOUCH) 100 UNIT/ML Pen Inject 22 Units into the skin 2 (two) times daily. 15 mL 5  . Insulin Pen Needle (BD PEN NEEDLE NANO U/F) 32G X 4 MM MISC Use for insulin administration three times daily 100 each 11  . lisinopril (PRINIVIL,ZESTRIL) 40 MG tablet TAKE ONE TABLET BY MOUTH ONCE DAILY 90 tablet 1  . rosuvastatin (CRESTOR) 5 MG tablet Take 1 tablet (5 mg total) by mouth daily. 30 tablet 5  . tamsulosin (FLOMAX) 0.4 MG CAPS capsule Take 1 capsule (0.4 mg total) by mouth daily. 30 capsule 1   No facility-administered medications prior to visit.    ROS Review of Systems  Constitutional: Negative for fever, chills, diaphoresis and fatigue.  HENT: Negative for trouble swallowing.   Eyes: Negative for visual disturbance.  Respiratory: Negative for cough, chest tightness, shortness of breath and wheezing.   Cardiovascular: Negative for chest pain, palpitations and  leg swelling.  Gastrointestinal: Negative for nausea, vomiting and diarrhea.  Genitourinary: Negative for penile swelling, scrotal swelling, difficulty urinating, penile pain and testicular pain.  Neurological: Positive for speech difficulty and weakness. Negative for dizziness and headaches.  Hematological: Bruises/bleeds  easily.       ASA regimen  Psychiatric/Behavioral: Negative for suicidal ideas and sleep disturbance. The patient is not nervous/anxious.     Objective:  BP 110/68 mmHg  Pulse 76  Temp(Src) 98.2 F (36.8 C)  Resp 18  Ht 6\' 2"  (1.88 m)  Wt 246 lb 12.8 oz (111.948 kg)  BMI 31.67 kg/m2  SpO2 95%  Physical Exam  Constitutional: He is oriented to person, place, and time. He appears well-developed and well-nourished. No distress.  HENT:  Head: Normocephalic and atraumatic.  Right Ear: External ear normal.  Left Ear: External ear normal.  Cardiovascular: Normal rate, regular rhythm, normal heart sounds and intact distal pulses.  Exam reveals no gallop and no friction rub.   No murmur heard. Pulmonary/Chest: Effort normal and breath sounds normal. No respiratory distress. He has no wheezes. He has no rales. He exhibits no tenderness.  Musculoskeletal: He exhibits no edema or tenderness.  Strength is 4 out of 5 bicep right side 5 out of 5 biceps left-sided 4-5 iliopsoas on right 5 out of 5 iliopsoas on left  Neurological: He is alert and oriented to person, place, and time. He displays abnormal reflex. No cranial nerve deficit. He exhibits abnormal muscle tone. Coordination abnormal.  Patient is in wheelchair Right-sided weakness DTRs of patella was difficult to obtain due to sitting in wheelchair, but decreased on right  Speech is slurred at baseline  Skin: Skin is warm and dry. No rash noted. He is not diaphoretic.  Psychiatric: He has a normal mood and affect. His behavior is normal. Judgment and thought content normal.   Assessment & Plan:   Jacob Buck was seen today for transient ischemic attack.  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  No orders of the defined types were placed in this encounter.     Follow-up: Return in about 6 weeks (around 07/15/2015) for Follow up for CVA, DM, Cardiac.

## 2015-06-03 NOTE — Patient Instructions (Addendum)
Please take 325 mg of Aspirin (Not 81 mg)   Check your blood sugars before giving your insulin.   Your Endocrinology appointment will be set up and they will contact you about this.   We will call Dr. Acquanetta Sit office to get them to set up an appointment with you.  I will not start Fenofibrate at this time due to your kidney status.   Heart healthy diet- low in salt, good vegetables, fruits, and water   Follow up with me in 6 weeks.

## 2015-06-04 ENCOUNTER — Ambulatory Visit: Payer: Medicare Other | Admitting: Nurse Practitioner

## 2015-06-08 ENCOUNTER — Encounter: Payer: Self-pay | Admitting: Nurse Practitioner

## 2015-06-08 NOTE — Assessment & Plan Note (Signed)
Patient has vascular surgeon and is to follow-up with this person. Unsure of name and time of visit.

## 2015-06-08 NOTE — Assessment & Plan Note (Signed)
Will call Dr. Acquanetta Sit office to get patient set up for an appointment for follow-up.

## 2015-06-08 NOTE — Assessment & Plan Note (Addendum)
Patient was seeing Dr. Howell Rucks in past. Patient needs extensive follow-up outpatient as a diabetic w/ complications. Patient did not bring blood sugars today. Refer to endocrinology Encouraged eye exam

## 2015-06-08 NOTE — Assessment & Plan Note (Signed)
Patient is very noncompliant with treatment regimens. Unsure of level of understanding as well as family's level of understanding. Financial concerns seem to be top priority. Will refer to [facility] for help with care once again.

## 2015-06-08 NOTE — Assessment & Plan Note (Signed)
Encouraged heart healthy diet and physical therapy is to help with exercises.

## 2015-06-08 NOTE — Assessment & Plan Note (Signed)
Endocrinology referral placed, [facility] referral placed Patient daughter was given handouts regarding help financially Encouraged follow-up with cardiology and nephrology Encouraged eye exam for diabetic retinopathy Advised patient to take 325 mg of aspirin daily not 81 mg Pt is not a candidate for fenofibrate at this time due to renal impairment, but I will let cardiologist make this call. Asked them to call anytime with any questions they may have.

## 2015-06-08 NOTE — Assessment & Plan Note (Signed)
Patient is weak on right side and has PT and home health to help him with rehabilitation.

## 2015-06-12 ENCOUNTER — Ambulatory Visit (INDEPENDENT_AMBULATORY_CARE_PROVIDER_SITE_OTHER): Payer: Medicare Other | Admitting: Cardiovascular Disease

## 2015-06-12 ENCOUNTER — Encounter: Payer: Self-pay | Admitting: Cardiovascular Disease

## 2015-06-12 VITALS — BP 114/63 | HR 82 | Ht 74.0 in | Wt 244.0 lb

## 2015-06-12 DIAGNOSIS — I422 Other hypertrophic cardiomyopathy: Secondary | ICD-10-CM

## 2015-06-12 DIAGNOSIS — I6322 Cerebral infarction due to unspecified occlusion or stenosis of basilar arteries: Secondary | ICD-10-CM

## 2015-06-12 DIAGNOSIS — I63211 Cerebral infarction due to unspecified occlusion or stenosis of right vertebral arteries: Secondary | ICD-10-CM | POA: Diagnosis not present

## 2015-06-12 DIAGNOSIS — I6310 Cerebral infarction due to embolism of unspecified precerebral artery: Secondary | ICD-10-CM

## 2015-06-12 DIAGNOSIS — R931 Abnormal findings on diagnostic imaging of heart and coronary circulation: Secondary | ICD-10-CM | POA: Diagnosis not present

## 2015-06-12 DIAGNOSIS — E1122 Type 2 diabetes mellitus with diabetic chronic kidney disease: Secondary | ICD-10-CM

## 2015-06-12 DIAGNOSIS — I10 Essential (primary) hypertension: Secondary | ICD-10-CM | POA: Diagnosis not present

## 2015-06-12 DIAGNOSIS — I429 Cardiomyopathy, unspecified: Secondary | ICD-10-CM

## 2015-06-12 DIAGNOSIS — E782 Mixed hyperlipidemia: Secondary | ICD-10-CM

## 2015-06-12 DIAGNOSIS — IMO0002 Reserved for concepts with insufficient information to code with codable children: Secondary | ICD-10-CM

## 2015-06-12 DIAGNOSIS — Z794 Long term (current) use of insulin: Secondary | ICD-10-CM

## 2015-06-12 DIAGNOSIS — N183 Chronic kidney disease, stage 3 (moderate): Secondary | ICD-10-CM

## 2015-06-12 DIAGNOSIS — I255 Ischemic cardiomyopathy: Secondary | ICD-10-CM

## 2015-06-12 DIAGNOSIS — E1165 Type 2 diabetes mellitus with hyperglycemia: Secondary | ICD-10-CM

## 2015-06-12 DIAGNOSIS — I6381 Other cerebral infarction due to occlusion or stenosis of small artery: Secondary | ICD-10-CM

## 2015-06-12 DIAGNOSIS — I639 Cerebral infarction, unspecified: Secondary | ICD-10-CM

## 2015-06-12 MED ORDER — LISINOPRIL 40 MG PO TABS: 40.0000 mg | ORAL_TABLET | Freq: Every day | ORAL | Status: DC

## 2015-06-12 MED ORDER — CARVEDILOL 12.5 MG PO TABS: 12.5000 mg | ORAL_TABLET | Freq: Two times a day (BID) | ORAL | Status: DC

## 2015-06-12 MED ORDER — ROSUVASTATIN CALCIUM 5 MG PO TABS: 5.0000 mg | ORAL_TABLET | Freq: Every day | ORAL | Status: DC

## 2015-06-12 NOTE — Assessment & Plan Note (Signed)
Underlying renal dysfunction likely secondary to long-standing diabetes

## 2015-06-12 NOTE — Patient Instructions (Addendum)
You are doing well. No medication changes were made.  We will order a stress test for cardiomyopathy, wall motion abnormality  [place]  Your caregiver has ordered a Stress Test with nuclear imaging. The purpose of this test is to evaluate the blood supply to your heart muscle. This procedure is referred to as a "Non-Invasive Stress Test." This is because other than having an IV started in your vein, nothing is inserted or "invades" your body. Cardiac stress tests are done to find areas of poor blood flow to the heart by determining the extent of coronary artery disease (CAD). Some patients exercise on a treadmill, which naturally increases the blood flow to your heart, while others who are  unable to walk on a treadmill due to physical limitations have a pharmacologic/chemical stress agent called Lexiscan . This medicine will mimic walking on a treadmill by temporarily increasing your coronary blood flow.   Please note: these test may take anywhere between 2-4 hours to complete  PLEASE REPORT TO [place] AT THE FIRST DESK WILL DIRECT YOU WHERE TO GO  Date of Procedure:_____________________________________  Arrival Time for Procedure:______________________________  Instructions regarding medication:   __X__ : Hold diabetes medication morning of procedure  __X__:  Hold CARVEDILOL night before procedure and morning of procedure   How to prepare for your Myoview test:   Do not eat or drink after midnight  No caffeine for 24 hours prior to test  No smoking 24 hours prior to test.  Your medication may be taken with water.  If your doctor stopped a medication because of this test, do not take that medication.  Ladies, please do not wear dresses.  Skirts or pants are appropriate. Please wear a short sleeve shirt.  No perfume, cologne or lotion.  We will also order a 30 day monitor for stroke Your physician has recommended that you wear an event  monitor. Event monitors are medical devices that record the heart's electrical activity. Doctors most often [place] these monitors to diagnose arrhythmias. Arrhythmias are problems with the speed or rhythm of the heartbeat. The monitor is a small, portable device. You can wear one while you do your normal daily activities. This is usually used to diagnose what is causing palpitations/syncope (passing out). You will receive a call from [employer] to verify your address before monitor is shipped to your home.   Please call us if you have new issues that need to be addressed before your next appt.  Your physician wants you to follow-up in: 5 weeks  Date & time:____________________________________   Cardiac Nuclear Scanning A cardiac nuclear scan is used to check your heart for problems, such as the following:  A portion of the heart is not getting enough blood.  Part of the heart muscle has died, which happens with a heart attack.  The heart wall is not working normally.  In this test, a radioactive dye (tracer) is injected into your bloodstream. After the tracer has traveled to your heart, a scanning device is used to measure how much of the tracer is absorbed by or distributed to various areas of your heart. LET [facility] CARE PROVIDER KNOW ABOUT:  Any allergies you have.  All medicines you are taking, including vitamins, herbs, eye drops, creams, and over-the-counter medicines.  Previous problems you or members of your family have had with the use of anesthetics.  Any blood disorders you have.  Previous surgeries you have had.  Medical conditions  you have.  RISKS AND COMPLICATIONS Generally, this is a safe procedure. However, as with any procedure, problems can occur. Possible problems include:   Serious chest pain.  Rapid heartbeat.  Sensation of warmth in your chest. This usually passes quickly. BEFORE THE PROCEDURE Ask your health care provider about changing or stopping your  regular medicines. PROCEDURE This procedure is usually done at a hospital and takes 2-4 hours.  An IV tube is inserted into one of your veins.  Your health care provider will inject a small amount of radioactive tracer through the tube.  You will then wait for 20-40 minutes while the tracer travels through your bloodstream.  You will lie down on an exam table so images of your heart can be taken. Images will be taken for about 15-20 minutes.  You will exercise on a treadmill or stationary bike. While you exercise, your heart activity will be monitored with an electrocardiogram (ECG), and your blood pressure will be checked.  If you are unable to exercise, you may be given a medicine to make your heart beat faster.  When blood flow to your heart has peaked, tracer will again be injected through the IV tube.  After 20-40 minutes, you will get back on the exam table and have more images taken of your heart.  When the procedure is over, your IV tube will be removed. AFTER THE PROCEDURE  You will likely be able to leave shortly after the test. Unless your health care provider tells you otherwise, you may return to your normal schedule, including diet, activities, and medicines.  Make sure you find out how and when you will get your test results.   This information is not intended to replace advice given to you by your health care provider. Make sure you discuss any questions you have with your health care provider.   Document Released: 07/15/2004 Document Revised: 06/25/2013 Document Reviewed: 05/29/2013 Elsevier Interactive Patient Education 2016 [employer]. Cardiac Event Monitoring A cardiac event monitor is a small recording device used to help detect abnormal heart rhythms (arrhythmias). The monitor is used to record heart rhythm when noticeable symptoms such as the following occur:  Fast heartbeats (palpitations), such as heart racing or fluttering.  Dizziness.  Fainting or  light-headedness.  Unexplained weakness. The monitor is wired to two electrodes placed on your chest. Electrodes are flat, sticky disks that attach to your skin. The monitor can be worn for up to 30 days. You will wear the monitor at all times, except when bathing.  HOW TO USE YOUR CARDIAC EVENT MONITOR A technician will prepare your chest for the electrode placement. The technician will show you how to place the electrodes, how to work the monitor, and how to replace the batteries. Take time to practice using the monitor before you leave the office. Make sure you understand how to send the information from the monitor to your health care provider. This requires a telephone with a landline, not a cell phone. You need to:  Wear your monitor at all times, except when you are in water:  Do not get the monitor wet.  Take the monitor off when bathing. Do not swim or use a hot tub with it on.  Keep your skin clean. Do not put body lotion or moisturizer on your chest.  Change the electrodes daily or any time they stop sticking to your skin. You might need to use tape to keep them on.  It is possible that your skin  under the electrodes could become irritated. To keep this from happening, try to put the electrodes in slightly different places on your chest. However, they must remain in the area under your left breast and in the upper right section of your chest.  Make sure the monitor is safely clipped to your clothing or in a location close to your body that your health care provider recommends.  Press the button to record when you feel symptoms of heart trouble, such as dizziness, weakness, light-headedness, palpitations, thumping, shortness of breath, unexplained weakness, or a fluttering or racing heart. The monitor is always on and records what happened slightly before you pressed the button, so do not worry about being too late to get good information.  Keep a diary of your activities, such as  walking, doing chores, and taking medicine. It is especially important to note what you were doing when you pushed the button to record your symptoms. This will help your health care provider determine what might be contributing to your symptoms. The information stored in your monitor will be reviewed by your health care provider alongside your diary entries.  Send the recorded information as recommended by your health care provider. It is important to understand that it will take some time for your health care provider to process the results.  Change the batteries as recommended by your health care provider. SEEK IMMEDIATE MEDICAL CARE IF:   You have chest pain.  You have extreme difficulty breathing or shortness of breath.  You develop a very fast heartbeat that persists.  You develop dizziness that does not go away.  You faint or constantly feel you are about to faint.   This information is not intended to replace advice given to you by your health care provider. Make sure you discuss any questions you have with your health care provider.   Document Released: 03/29/2008 Document Revised: 07/11/2014 Document Reviewed: 12/17/2012 Elsevier Interactive Patient Education [employer].

## 2015-06-12 NOTE — Assessment & Plan Note (Signed)
We have encouraged continued exercise, careful diet management in an effort to lose weight. 

## 2015-06-12 NOTE — Assessment & Plan Note (Signed)
MRI indicates new stroke, as well as 2 prior strokes Likely dating back to March 2016, etiology unclear. Mild bilateral carotid disease Concerning for embolic phenomenon. We have ordered a 30 day monitor Encouraged him to stay on his aspirin. If he fails aspirin, would likely need warfarin or other anticoagulation such as a NOAC. Stroke could be coming from arrhythmia such as atrial fibrillation. Unable to exclude mural thrombus as echocardiogram does suggest lateral wall hypokinesis.  May benefit from follow-up with neurology, contact information has been provided to him today. We have recommended he call for an appointment

## 2015-06-12 NOTE — Progress Notes (Signed)
Patient ID: Jacob Buck, male    DOB: 1940-07-17, 74 y.o.   MRN: [id number]  HPI Comments: Jacob Buck is a 74 year old gentleman who is new to our office presenting for evaluation of stroke, cardiomyopathy. Long history of diabetes, hypertension, hyperlipidemia  Recently admitted to the hospital with slurred speech, found to have acute stroke MRI of the brain showing new stroke as well as 2 prior strokes He reports symptoms date back to March 2016 when he had acute onset right arm weakness, numbness of his tongue on the right Diagnosed with stroke at that time  Recent workup included echocardiogram showing ejection fraction 35%, lateral wall hypokinesis He was started on full dose aspirin. Notes indicate case was discussed with neurology but no formal note is available He denies any outpatient follow-up with neurology  No one has discussed other causes of stroke with him over the course of the year, prior admissions even the recent admission He reports he is scheduled for sleep study  He does report occasional ankle edema which seems to come and go, better in the morning Denies significant shortness of breath or chest pain on exertion  EKG on today's visit shows significant artifact, normal sinus rhythm with rate 82 bpm, unable to exclude old inferior MI, old anterior MI, nonspecific T wave abnormality   Allergies  Allergen Reactions  . Toujeo Solostar [Insulin Glargine] Other (See Comments)    Reaction : Numbness in arm, tingling    Current Outpatient Prescriptions on File Prior to Visit  Medication Sig Dispense Refill  . aspirin 325 MG tablet Take 1 tablet (325 mg total) by mouth daily. 30 tablet 5  . docusate sodium (COLACE) 100 MG capsule Take 2 capsules (200 mg total) by mouth 2 (two) times daily. 120 capsule 3  . Insulin Detemir (LEVEMIR FLEXTOUCH) 100 UNIT/ML Pen Inject 22 Units into the skin 2 (two) times daily. 15 mL 5  . Insulin Pen Needle (BD PEN NEEDLE NANO U/F) 32G  X 4 MM MISC Use for insulin administration three times daily 100 each 11  . tamsulosin (FLOMAX) 0.4 MG CAPS capsule Take 1 capsule (0.4 mg total) by mouth daily. 30 capsule 1   No current facility-administered medications on file prior to visit.    Past Medical History  Diagnosis Date  . Endocrine problem 2008  . Arthritis 2008  . Diabetes mellitus without complication ([place]) [id number]  . Bowel trouble 2008  . Diverticulosis   . Cancer [facility]) 1991    Colon; right kidney ?  Marland Kitchen Hypertension   . Hyperlipidemia   . Stroke [facility])     Past Surgical History  Procedure Laterality Date  . Colonoscopy  2003, 2014  . Foot surgery Right 2013  . Colon surgery  2008    carcinoid  . Cystoscopy with direct vision internal urethrotomy N/A 04/28/2015    Procedure: CYSTOSCOPY WITH DIRECT VISION INTERNAL URETHROTOMY WITH HOLMIUM LASER ;  Surgeon: Royston Cowper, MD;  Location: [facility] ORS;  Service: Urology;  Laterality: N/A;    Social History  reports that he has quit smoking. He has never used smokeless tobacco. He reports that he does not drink alcohol or use illicit drugs.  Family History family history includes Alzheimer's disease in his brother and sister; Diabetes in his brother and sister; Heart disease in his father.   Review of Systems  Constitutional: Negative.   HENT: Negative.   Respiratory: Negative.   Cardiovascular: Negative.   Gastrointestinal: Negative.   Musculoskeletal: Negative.  Neurological: Negative.        Right arm weakness, tongue numb Residual slurred speech, improving  Hematological: Negative.   Psychiatric/Behavioral: Negative.   All other systems reviewed and are negative.   BP 114/63 mmHg  Pulse 82  Ht 6\' 2"  (1.88 m)  Wt 244 lb (110.678 kg)  BMI 31.31 kg/m2  Physical Exam  Constitutional: He is oriented to person, place, and time. He appears well-developed and well-nourished.  HENT:  Head: Normocephalic.  Nose: Nose normal.  Mouth/Throat: Oropharynx is  clear and moist.  Eyes: Conjunctivae are normal. Pupils are equal, round, and reactive to light.  Neck: Normal range of motion. Neck supple. No JVD present.  Cardiovascular: Normal rate, regular rhythm, normal heart sounds and intact distal pulses.  Exam reveals no gallop and no friction rub.   No murmur heard. Pulmonary/Chest: Effort normal and breath sounds normal. No respiratory distress. He has no wheezes. He has no rales. He exhibits no tenderness.  Abdominal: Soft. Bowel sounds are normal. He exhibits no distension. There is no tenderness.  Musculoskeletal: Normal range of motion. He exhibits no edema or tenderness.  Lymphadenopathy:    He has no cervical adenopathy.  Neurological: He is alert and oriented to person, place, and time. Coordination normal.  Full neurological exam was not completed  Skin: Skin is warm and dry. No rash noted. No erythema.  Psychiatric: He has a normal mood and affect. His behavior is normal. Judgment and thought content normal.

## 2015-06-12 NOTE — Assessment & Plan Note (Signed)
Blood pressure is well controlled on today's visit. No changes made to the medications. 

## 2015-06-12 NOTE — Assessment & Plan Note (Signed)
Goal LDL less than 70 Suspect he will need higher dose Crestor

## 2015-06-12 NOTE — Assessment & Plan Note (Signed)
Recent echocardiogram with ejection fraction 35%, We have recommended stress test to rule out ischemia Echocardiogram did suggest wall motion abnormality lateral wall concerning for old infarct

## 2015-06-16 ENCOUNTER — Encounter (INDEPENDENT_AMBULATORY_CARE_PROVIDER_SITE_OTHER): Payer: Medicare Other

## 2015-06-16 ENCOUNTER — Other Ambulatory Visit: Payer: Self-pay | Admitting: Nurse Practitioner

## 2015-06-16 DIAGNOSIS — I6381 Other cerebral infarction due to occlusion or stenosis of small artery: Secondary | ICD-10-CM

## 2015-06-16 DIAGNOSIS — I10 Essential (primary) hypertension: Secondary | ICD-10-CM

## 2015-06-16 DIAGNOSIS — I6322 Cerebral infarction due to unspecified occlusion or stenosis of basilar arteries: Secondary | ICD-10-CM

## 2015-06-16 DIAGNOSIS — I63211 Cerebral infarction due to unspecified occlusion or stenosis of right vertebral arteries: Secondary | ICD-10-CM

## 2015-06-16 DIAGNOSIS — I429 Cardiomyopathy, unspecified: Secondary | ICD-10-CM | POA: Diagnosis not present

## 2015-06-16 DIAGNOSIS — R931 Abnormal findings on diagnostic imaging of heart and coronary circulation: Secondary | ICD-10-CM | POA: Diagnosis not present

## 2015-06-16 DIAGNOSIS — I639 Cerebral infarction, unspecified: Secondary | ICD-10-CM

## 2015-06-19 ENCOUNTER — Encounter: Payer: Self-pay | Admitting: Nurse Practitioner

## 2015-07-14 ENCOUNTER — Encounter: Payer: Self-pay | Admitting: Cardiovascular Disease

## 2015-07-20 ENCOUNTER — Other Ambulatory Visit: Payer: Self-pay | Admitting: Nurse Practitioner

## 2015-07-20 NOTE — Telephone Encounter (Signed)
Please advise  Last OV 06/03/15, Last filled 06/16/15

## 2015-07-20 NOTE — Telephone Encounter (Signed)
Patient notified to pick up RX.

## 2015-07-21 ENCOUNTER — Ambulatory Visit (INDEPENDENT_AMBULATORY_CARE_PROVIDER_SITE_OTHER): Payer: Medicare Other | Admitting: Nurse Practitioner

## 2015-07-21 ENCOUNTER — Encounter: Payer: Self-pay | Admitting: Nurse Practitioner

## 2015-07-21 VITALS — BP 185/103 | HR 81 | Ht 74.0 in | Wt 248.5 lb

## 2015-07-21 DIAGNOSIS — I119 Hypertensive heart disease without heart failure: Secondary | ICD-10-CM

## 2015-07-21 DIAGNOSIS — N183 Chronic kidney disease, stage 3 (moderate): Secondary | ICD-10-CM

## 2015-07-21 DIAGNOSIS — E785 Hyperlipidemia, unspecified: Secondary | ICD-10-CM | POA: Diagnosis not present

## 2015-07-21 DIAGNOSIS — E1122 Type 2 diabetes mellitus with diabetic chronic kidney disease: Secondary | ICD-10-CM

## 2015-07-21 DIAGNOSIS — I255 Ischemic cardiomyopathy: Secondary | ICD-10-CM | POA: Diagnosis not present

## 2015-07-21 DIAGNOSIS — I638 Other cerebral infarction: Secondary | ICD-10-CM

## 2015-07-21 DIAGNOSIS — I6389 Other cerebral infarction: Secondary | ICD-10-CM

## 2015-07-21 DIAGNOSIS — I639 Cerebral infarction, unspecified: Secondary | ICD-10-CM | POA: Insufficient documentation

## 2015-07-21 NOTE — Patient Instructions (Signed)
Medication Instructions: - no changes  Labwork: - none  Procedures/Testing: - none  Follow-Up: - Your physician recommends that you schedule a follow-up appointment in: 2 weeks for a blood pressure check with the nurse  - Your physician wants you to follow-up in: 6 months with Dr. Rockey Situ. You will receive a reminder letter in the mail two months in advance. If you don't receive a letter, please call our office to schedule the follow-up appointment.  Any Additional Special Instructions Will Be Listed Below (If Applicable).

## 2015-07-21 NOTE — Progress Notes (Signed)
Office Visit    Patient Name: Jacob Buck Date of Encounter: 07/21/2015  Primary Care Provider:  Rubbie Battiest, NP Primary Cardiologist:  Johnny Bridge, MD   Chief Complaint    75 year old male with history of hypertension, cardiomyopathy, and stroke in November 2016, who presents for follow-up.  Past Medical History    Past Medical History  Diagnosis Date  . Arthritis 2008  . Diabetes mellitus without complication ([place]) [id number]  . Bowel trouble 2008  . Diverticulosis   . Cancer [facility]) 1991    Colon; right kidney ?  Marland Kitchen Hypertension   . Hyperlipidemia   . Stroke [facility])     a. 05/2015 MRI: acute to early subacute R thalamic infarct, chronic L thalamic infarct, late subacute to chronic right occipital infarct;  b. 05/2015 Carotid U/S: RICA [id number], LICA [id number], patent vertebrals;  b. 06/2015 Event Monitor: RSR, rare PVC's.  . CKD (chronic kidney disease), stage III   . Cardiomyopathy ([place])     a. ? isch vs nicm;  b. 05/2015 Echo: EF 35-40%, mild MR, mildly dil LA, poor acoustic windows.   Past Surgical History  Procedure Laterality Date  . Colonoscopy  2003, 2014  . Foot surgery Right 2013  . Colon surgery  2008    carcinoid  . Cystoscopy with direct vision internal urethrotomy N/A 04/28/2015    Procedure: CYSTOSCOPY WITH DIRECT VISION INTERNAL URETHROTOMY WITH HOLMIUM LASER ;  Surgeon: Royston Cowper, MD;  Location: [facility] ORS;  Service: Urology;  Laterality: N/A;    Allergies  Allergies  Allergen Reactions  . Toujeo Solostar [Insulin Glargine] Other (See Comments)    Reaction : Numbness in arm, tingling    History of Present Illness    75 year old male with the above complex past medical history. In November 2016, he suffered an acute right thalamic stroke with evidence of chronic left thalamic infarct and right occipital infarct. Echocardiogram during that admission showed LV dysfunction with an EF of 35-40%. Following adequate rehabilitation, he has done fairly well  and is at home. He was last seen in clinic in December. At that time, a 30 day event monitor was placed and has since returned showing sinus rhythm with PVCs. There was no evidence of atrial fibrillation.  He has recovered reasonably well and ambulates without difficulty. He has some left-sided weakness but overall, this does not limit his activity. He did have home health physical therapy up until last week and he continues to do exercises that he was shown by PT on a daily basis. He has been checking his blood pressure periodically and he and his daughter noted that it typically runs in the 120s. He ran out of lisinopril 2 days ago and blood pressure is 185/103 in the office today. He is going to the pharmacy after this visit for his refill. He has been taking his carvedilol.  He has not been having any chest pain, dyspnea exertion, PND, orthopnea, dizziness, syncope, or early satiety. He is relatively sedentary and does note lower extremity edema above his sock line by the end of the day. We did discuss the role of stress testing in the setting of a cardiomyopathy however he remains disinterested.  Home Medications    Prior to Admission medications   Medication Sig Start Date End Date Taking? Authorizing Provider  aspirin 325 MG tablet Take 1 tablet (325 mg total) by mouth daily. 05/31/15  Yes Theodoro Grist, MD  carvedilol (COREG) 12.5 MG tablet Take 1  tablet (12.5 mg total) by mouth 2 (two) times daily with a meal. 06/12/15  Yes Minna Merritts, MD  docusate sodium (COLACE) 100 MG capsule Take 2 capsules (200 mg total) by mouth 2 (two) times daily. Patient taking differently: Take 200 mg by mouth as needed.  04/28/15  Yes Royston Cowper, MD  Insulin Detemir (LEVEMIR FLEXTOUCH) 100 UNIT/ML Pen Inject 22 Units into the skin 2 (two) times daily. 05/31/15  Yes Theodoro Grist, MD  Insulin Pen Needle (BD PEN NEEDLE NANO U/F) 32G X 4 MM MISC Use for insulin administration three times daily 06/05/14  Yes  Radhika P Phadke, MD  lisinopril (PRINIVIL,ZESTRIL) 40 MG tablet Take 1 tablet (40 mg total) by mouth daily. 06/12/15  Yes Minna Merritts, MD  rosuvastatin (CRESTOR) 5 MG tablet Take 1 tablet (5 mg total) by mouth daily. 06/12/15  Yes Minna Merritts, MD  tamsulosin (FLOMAX) 0.4 MG CAPS capsule TAKE ONE CAPSULE BY MOUTH ONCE DAILY 07/20/15  Yes Rubbie Battiest, NP    Review of Systems    As above, he is recovered fairly well from his stroke and is ambulating without difficulty. He denies chest pain, palpitations, PND, orthopnea, dizziness, syncope, dyspnea, or early satiety. He does have mild lower extremity edema above the sock line after sitting for much of the day. All other systems reviewed and are otherwise negative except as noted above.  Physical Exam    VS:  BP 185/103 mmHg  Pulse 81  Ht 6\' 2"  (1.88 m)  Wt 248 lb 8 oz (112.719 kg)  BMI 31.89 kg/m2 , BMI Body mass index is 31.89 kg/(m^2). GEN: Well nourished, well developed, in no acute distress. HEENT: normal. Neck: Supple, no JVD, carotid bruits, or masses. Cardiac: RRR, no murmurs, rubs, or gallops. No clubbing, cyanosis, edema.  Radials/DP/PT 2+ and equal bilaterally.  Respiratory:  Respirations regular and unlabored, clear to auscultation bilaterally. GI: Soft, nontender, nondistended, BS + x 4. MS: no deformity or atrophy. Skin: warm and dry, no rash. Neuro:  Strength and sensation are intact. Psych: Normal affect.  Accessory Clinical Findings    Event monitor reviewed-sinus rhythm with occasional PVCs. No evidence of atrial fibrillation.  Assessment & Plan    1.  Hypertensive heart disease: Patient checks his blood pressure at home every so often and notes that it has been in the 120s. His daughter confirms this. Today he is 185/103 but notes he has not taken his lisinopril in 2 days. He is leaving here to go to the pharmacy to pick it up. He remains on Coreg 12.5 mg twice a day. Have asked him to follow his blood  pressure daily over the next week and have given them the option of coming back for a blood pressure check in 2 weeks. They prefer to check his pressure at home and call in results. He may require additional titration of carvedilol. We also discussed his diet. It appears that he has some form of breakfast meat on a daily basis including sausage, bacon, or [place] bacon. I advised that he try to avoid these foods given their high salt content.  2. Cardiomyopathy: Question ischemic versus nonischemic. EF 35-40% by echocardiography in November. I again discussed the role of an ischemic evaluation in the setting of LV dysfunction, however he is not interested. He says he will call us if he has chest pain or dyspnea. He remains on beta blocker and will have his ACE inhibitor refilled today.  3. History  of stroke: Recovering well. He remains on aspirin and statin therapy.  4. Hyperlipidemia: LDL was 131 in November. He had normal LFTs at that time. This is followed by primary care.  5. Stage III chronic kidney disease: Creatinine was stable during admission in November. He is on ACE inhibitor therapy.  6. Morbid obesity: Encouraged him to increase his activity and try and walk for at least 30 minutes daily. We also talked about caloric restriction. It sounds as though his wife prepares a fairly large breakfast on a daily basis including salty meats. I recommended he avoid these and she is healthier options.  7. Disposition: Follow-up blood pressure check in 1-2 weeks and follow-up with Dr. Rockey Situ in 6 months or sooner if necessary.  Murray Hodgkins, NP 07/21/2015, 11:06 AM

## 2015-08-02 ENCOUNTER — Encounter: Payer: Self-pay | Admitting: Nurse Practitioner

## 2015-08-02 DIAGNOSIS — G4733 Obstructive sleep apnea (adult) (pediatric): Secondary | ICD-10-CM | POA: Insufficient documentation

## 2015-08-03 ENCOUNTER — Ambulatory Visit (INDEPENDENT_AMBULATORY_CARE_PROVIDER_SITE_OTHER): Payer: Self-pay

## 2015-08-03 ENCOUNTER — Telehealth: Payer: Self-pay

## 2015-08-03 VITALS — BP 170/100 | HR 71 | Resp 16

## 2015-08-03 DIAGNOSIS — I159 Secondary hypertension, unspecified: Secondary | ICD-10-CM

## 2015-08-03 NOTE — Telephone Encounter (Signed)
S/w pt regarding Chris's recommendations to increase coreg. Pt reports BP 155/96 at nephrologist office today. Pt verbalized understanding, is agreeable w/plan and would prefer to return to our office next week for BP check. Appt 2/6 at 8:10am.

## 2015-08-03 NOTE — Patient Instructions (Signed)
1.) Reason for visit: BP check  2.) Name of MD requesting visit: Ignacia Bayley, NP  3.) H&P: Pt w/hx of HTN and CVA Nov 16. At Jan 17 OV, BP 185/103. Pt stated he had run out of lisinopril 2 days prior and had not yet picked up refill but indicated he was taking coreg 12.5mg  BID as scheduled. Pt instructed to take meds and return in 2 weeks for BP check.   4.) ROS related to problem: At today's nurse visit, pt states he did restart lisinopril Jan 17. No missed doses of lisinopril or coreg. He took morning medications today at 7:30am and had a banana for breakfast. Denies eating breakfast meats or processed lunch meats. Male that accompanies pt states she does not salt foods while cooking and rinses canned vegetables. Pt denies using salt shaker. BP today 170/100, 166/98. Advised pt to continue low sodium diet, take meds as scheduled and informed him I will call with further recommendations from [place]. Pt agreeable w/plan.   5.) Assessment and plan per MD: Forward to Ignacia Bayley, NP to advise.

## 2015-08-03 NOTE — Telephone Encounter (Signed)
Could you please have him increase his coreg to 18.75 mg bid (12.5 mg, 1.5 tabs BID). He should continue to check his bp's daily and record. He can either call with recordings or come back in a week for you/mandy to check.         Thanks,        Chris        No answer cell or home number. No VM on phones. Will CB

## 2015-08-03 NOTE — Telephone Encounter (Signed)
Patient wife returning call from [place]

## 2015-08-03 NOTE — Telephone Encounter (Signed)
Attempted to contact pt again regarding increasing coreg. No answer or VM on either home or cell phone. Will call again.

## 2015-08-10 ENCOUNTER — Telehealth: Payer: Self-pay

## 2015-08-10 ENCOUNTER — Other Ambulatory Visit: Payer: Self-pay

## 2015-08-10 ENCOUNTER — Ambulatory Visit (INDEPENDENT_AMBULATORY_CARE_PROVIDER_SITE_OTHER): Payer: Self-pay

## 2015-08-10 VITALS — BP 180/100 | HR 76 | Resp 18

## 2015-08-10 DIAGNOSIS — I159 Secondary hypertension, unspecified: Secondary | ICD-10-CM

## 2015-08-10 MED ORDER — CARVEDILOL 25 MG PO TABS: 25.0000 mg | ORAL_TABLET | Freq: Two times a day (BID) | ORAL | Status: DC

## 2015-08-10 NOTE — Telephone Encounter (Signed)
Informed pt wife that refill submitted for 25mg  coreg. Pt is to take one tablet BID. Wife verbalized understanding and is appreciative of the call.

## 2015-08-10 NOTE — Patient Instructions (Addendum)
1.) Reason for visit: BP check  2.) Name of MD requesting visit: Ignacia Bayley, NP  3.) H&P: Pt has hx HTN.  On Jan 30, BP 170/100. Coreg increased to 18.75 BID.   4.) ROS related to problem: Pt returns today for repeat BP check. BP readings manually and electronically: 180/100 and 174/102. Pt states he has been taking meds as instructed with no missed doses. Wife states she cooks and only uses "a little salt because I don't like salt". Upon further discussion, pt and wife eat out frequently including [employer], [employer], [employer], [employer] and [employer].  Yesterday, pt had [employer]achi shrimp and chicken for lunch and [employer] for dinner.  We again reviewed sodium content especially in restaurants. Pt wife states they have a computer at home. I suggested she look at the nutritional value of menu items at these restaurants online and take note of the sodium content.  She is agreeable w/plan. On the way out, she indicates they are going to [place] for breakfast.    Advised pt to take medications as prescribed, follow a low sodium diet and purchase a BP cuff for home use. He states he called insurance but they will not pay for BP cuff. Advised him to go to [place], [facility] to price them.   5.) Assessment and plan per MD: Forward to Ignacia Bayley to review and advise.

## 2015-08-10 NOTE — Telephone Encounter (Signed)
Per notes from Ignacia Bayley, NP: "He should increase his coreg to 25 bid and most certainly avoid all of the salty foods and fast food places that you mentioned in your note."  Reviewed recommendations to pt's wife, Tamela Oddi, who relayed message to pt who is in the background. Advised to purchase BP cuff, monitor, and report to [place] if BP does not improve. Pt and pt wife verbalized understanding and are agreeable w/plan.

## 2015-08-21 ENCOUNTER — Other Ambulatory Visit: Payer: Self-pay | Admitting: Nurse Practitioner

## 2015-10-30 ENCOUNTER — Telehealth: Payer: Self-pay | Admitting: Cardiovascular Disease

## 2015-10-30 NOTE — Telephone Encounter (Signed)
Nurse Jinny Blossom calling asking if patient has or had CHF  Please call number  [phone] ex [phone]

## 2015-10-30 NOTE — Telephone Encounter (Signed)
Left message on Megan, RN's vm w/ [employer] that pt does not have dx of CHF in his chart.  Asked her to call back w/ any questions or concerns.

## 2015-12-28 ENCOUNTER — Other Ambulatory Visit: Payer: Self-pay | Admitting: Nurse Practitioner

## 2015-12-28 ENCOUNTER — Other Ambulatory Visit: Payer: Self-pay | Admitting: *Deleted

## 2015-12-28 DIAGNOSIS — Z760 Encounter for issue of repeat prescription: Secondary | ICD-10-CM

## 2015-12-28 MED ORDER — INSULIN DETEMIR 100 UNIT/ML FLEXPEN: 22.0000 [IU] | PEN_INJECTOR | Freq: Two times a day (BID) | SUBCUTANEOUS | Status: DC

## 2015-12-28 NOTE — Telephone Encounter (Signed)
Pt scheduled for tomorrow. Allergy to [employer]. Please advise?

## 2015-12-28 NOTE — Telephone Encounter (Signed)
Patient has requested a medication refill for insulin detemir Pharmacy [facility] [address]

## 2015-12-28 NOTE — Telephone Encounter (Signed)
Called patient to schedule appointment.  No answer.  No voicemail.  Last OV 06-03-15 Requests refill of Insulin Determir No upcoming scheduled appointments Medication pended for you approval/refusal

## 2015-12-29 ENCOUNTER — Ambulatory Visit (INDEPENDENT_AMBULATORY_CARE_PROVIDER_SITE_OTHER): Payer: Medicare Other | Admitting: Family Medicine

## 2015-12-29 ENCOUNTER — Encounter: Payer: Self-pay | Admitting: Family Medicine

## 2015-12-29 VITALS — BP 150/110 | HR 76 | Temp 98.0°F | Ht 74.0 in | Wt 244.5 lb

## 2015-12-29 DIAGNOSIS — E1165 Type 2 diabetes mellitus with hyperglycemia: Secondary | ICD-10-CM | POA: Diagnosis not present

## 2015-12-29 DIAGNOSIS — E1122 Type 2 diabetes mellitus with diabetic chronic kidney disease: Secondary | ICD-10-CM

## 2015-12-29 DIAGNOSIS — D649 Anemia, unspecified: Secondary | ICD-10-CM

## 2015-12-29 DIAGNOSIS — Z794 Long term (current) use of insulin: Secondary | ICD-10-CM

## 2015-12-29 DIAGNOSIS — E785 Hyperlipidemia, unspecified: Secondary | ICD-10-CM | POA: Diagnosis not present

## 2015-12-29 DIAGNOSIS — Z23 Encounter for immunization: Secondary | ICD-10-CM | POA: Diagnosis not present

## 2015-12-29 DIAGNOSIS — IMO0002 Reserved for concepts with insufficient information to code with codable children: Secondary | ICD-10-CM

## 2015-12-29 DIAGNOSIS — N183 Chronic kidney disease, stage 3 (moderate): Secondary | ICD-10-CM

## 2015-12-29 DIAGNOSIS — I10 Essential (primary) hypertension: Secondary | ICD-10-CM | POA: Diagnosis not present

## 2015-12-29 MED ORDER — ROSUVASTATIN CALCIUM 20 MG PO TABS: 20.0000 mg | ORAL_TABLET | Freq: Every day | ORAL | Status: DC

## 2015-12-29 MED ORDER — CARVEDILOL 25 MG PO TABS: 25.0000 mg | ORAL_TABLET | Freq: Two times a day (BID) | ORAL | Status: DC

## 2015-12-29 NOTE — Patient Instructions (Signed)
Take the medication as prescribed.  We will call with the appt to see ophthalmology.  Follow up in 3 months.  Take care  Dr. Lacinda Axon

## 2015-12-29 NOTE — Progress Notes (Signed)
Pre visit review using our clinic review tool, if applicable. No additional management support is needed unless otherwise documented below in the visit note. 

## 2015-12-29 NOTE — Progress Notes (Signed)
Subjective:  Patient ID: Jacob Buck, male    DOB: [birth year]  Age: 75 y.o. MRN: [id number]  CC: Follow up  HPI:  75 year old male with hypertension, hyperlipidemia, DM 2 with complications, CKD, cardiomyopathy, history of stroke presents for follow-up.  HTN  Has been stable on Coreg and Lisinopril  He states that his Coreg was recently prescribed incorrectly. Requesting refill on Coreg 25 mg today.  Tolerating medication without difficulty. No reported side effects.  DM-2 Not at goal (although you could argue for a high goal of 8 given age). Hypoglycemia - Has had episodes of hypoglycemia.  Medications - Levemir 22 units BID. Compliance - Endorses compliance. Preventative care Eye exam - In need of. Has not had an eye exam in years. Foot exam - In need of today.  Last A1C - In need of today. Urine microalbumin - In need of today. Followed by Renal. On Aspirin and Statin.  Hyperlipidemia  Uncontrolled.   Last LDL was 131.  Will discuss increasing Crestor today.   Social Hx   Social History   Social History  . Marital Status: Married    Spouse Name: N/A  . Number of Children: N/A  . Years of Education: N/A   Social History Main Topics  . Smoking status: Former Research scientist (life sciences)  . Smokeless tobacco: Never Used  . Alcohol Use: No  . Drug Use: No  . Sexual Activity: No   Other Topics Concern  . None   Social History Narrative   Retired in 2006   Lives at home with wife   2 children daughters   Enjoys- fishing    Left handed    Pets- 2 inside dogs   Review of Systems  Respiratory: Negative.   Cardiovascular: Negative.    Objective:  BP 150/110 mmHg  Pulse 76  Temp(Src) 98 F (36.7 C) (Oral)  Ht 6' 2" (1.88 m)  Wt 244 lb 8 oz (110.904 kg)  BMI 31.38 kg/m2  SpO2 96%  BP/Weight 12/29/2015 08/10/2015 5/80/9983  Systolic BP 382 505 397  Diastolic BP 673 419 379  Wt. (Lbs) 244.5 - -  BMI 31.38 - -   Physical Exam  Constitutional: He appears  well-developed. No distress.  Cardiovascular: Normal rate and regular rhythm.   Pulmonary/Chest: Effort normal. He has no wheezes. He has no rales.  Neurological: He is alert.  Psychiatric: He has a normal mood and affect.  Vitals reviewed. Diabetic foot performed - see quality metrics.  Lab Results  Component Value Date   WBC 7.8 05/31/2015   HGB 12.1* 05/31/2015   HCT 37.1* 05/31/2015   PLT 175 05/31/2015   GLUCOSE 106* 05/31/2015   CHOL 236* 05/31/2015   TRIG 352* 05/31/2015   HDL 35* 05/31/2015   LDLDIRECT 68.0 03/03/2015   LDLCALC 131* 05/31/2015   ALT 14* 05/30/2015   AST 17 05/30/2015   NA 136 05/31/2015   K 4.0 05/31/2015   CL 102 05/31/2015   CREATININE 1.68* 05/31/2015   BUN 39* 05/31/2015   CO2 27 05/31/2015   TSH 2.044 05/31/2015   HGBA1C 7.7* 05/31/2015   MICROALBUR 145.0* 07/22/2014    Assessment & Plan:   Problem List Items Addressed This Visit    Type 2 diabetes, uncontrolled, with renal manifestation ([place])    Uncontrolled. A1C today. Continue Levemir 22 units BID. Referral to ophthalmology placed.      Relevant Medications   rosuvastatin (CRESTOR) 20 MG tablet   Other Relevant Orders   Comp Met (  CMET)   HgB A1c   Urine Microalbumin w/creat. ratio   Ambulatory referral to Ophthalmology   Essential hypertension, benign - Primary    Not at goal. Likely secondary to recent changed in [place] (inadvertant). Rx for Coreg 25 mg BID sent. Continue Lisinopril.      Relevant Medications   carvedilol (COREG) 25 MG tablet   rosuvastatin (CRESTOR) 20 MG tablet   Anemia   Relevant Orders   CBC   Hyperlipidemia    Uncontrolled. Increasing Crestor to 20 mg daily.       Relevant Medications   carvedilol (COREG) 25 MG tablet   rosuvastatin (CRESTOR) 20 MG tablet   Other Relevant Orders   Lipid Profile    Other Visit Diagnoses    Need for prophylactic vaccination against Streptococcus pneumoniae (pneumococcus)        Relevant Orders     Pneumococcal conjugate vaccine 13-valent (Completed)       Meds ordered this encounter  Medications  . carvedilol (COREG) 25 MG tablet    Sig: Take 1 tablet (25 mg total) by mouth 2 (two) times daily with a meal.    Dispense:  180 tablet    Refill:  3  . rosuvastatin (CRESTOR) 20 MG tablet    Sig: Take 1 tablet (20 mg total) by mouth daily.    Dispense:  90 tablet    Refill:  3    Follow-up: 3 months.  [place]

## 2015-12-30 ENCOUNTER — Encounter: Payer: Self-pay | Admitting: Nurse Practitioner

## 2015-12-30 LAB — LIPID PANEL
CHOL/HDL RATIO: 4
Cholesterol: 143 mg/dL (ref 0–200)
HDL: 36 mg/dL — ABNORMAL LOW (ref >39.00)
NonHDL: 106.56
TRIGLYCERIDES: 245 mg/dL — AB (ref 0.0–149.0)
VLDL: 49 mg/dL — AB (ref 0.0–40.0)

## 2015-12-30 LAB — CBC
HCT: 36 % — ABNORMAL LOW (ref 39.0–52.0)
HEMOGLOBIN: 12.1 g/dL — AB (ref 13.0–17.0)
MCHC: 33.6 g/dL (ref 30.0–36.0)
MCV: 91.8 fl (ref 78.0–100.0)
Platelets: 149 10*3/uL — ABNORMAL LOW (ref 150.0–400.0)
RBC: 3.92 Mil/uL — ABNORMAL LOW (ref 4.22–5.81)
RDW: 13.3 % (ref 11.5–15.5)
WBC: 8.9 10*3/uL (ref 4.0–10.5)

## 2015-12-30 LAB — COMPREHENSIVE METABOLIC PANEL
ALK PHOS: 60 U/L (ref 39–117)
ALT: 17 U/L (ref 0–53)
AST: 16 U/L (ref 0–37)
Albumin: 4.2 g/dL (ref 3.5–5.2)
BILIRUBIN TOTAL: 0.6 mg/dL (ref 0.2–1.2)
BUN: 39 mg/dL — AB (ref 6–23)
CO2: 27 meq/L (ref 19–32)
Calcium: 9.3 mg/dL (ref 8.4–10.5)
Chloride: 104 mEq/L (ref 96–112)
Creatinine, Ser: 1.81 mg/dL — ABNORMAL HIGH (ref 0.40–1.50)
GFR: 47.26 mL/min — AB (ref >60.00)
GLUCOSE: 198 mg/dL — AB (ref 70–99)
POTASSIUM: 4.9 meq/L (ref 3.5–5.1)
SODIUM: 138 meq/L (ref 135–145)
TOTAL PROTEIN: 7.7 g/dL (ref 6.0–8.3)

## 2015-12-30 LAB — MICROALBUMIN / CREATININE URINE RATIO
CREATININE, U: 210.5 mg/dL
MICROALB/CREAT RATIO: 106 mg/g — AB (ref 0.0–30.0)
Microalb, Ur: 223.2 mg/dL — ABNORMAL HIGH (ref 0.0–1.9)

## 2015-12-30 LAB — LDL CHOLESTEROL, DIRECT: Direct LDL: 66 mg/dL

## 2015-12-30 LAB — HEMOGLOBIN A1C: Hgb A1c MFr Bld: 7.5 % — ABNORMAL HIGH (ref 4.6–6.5)

## 2015-12-30 NOTE — Assessment & Plan Note (Signed)
Not at goal. Likely secondary to recent changed in Coreg (inadvertant). Rx for Coreg 25 mg BID sent. Continue Lisinopril.

## 2015-12-30 NOTE — Assessment & Plan Note (Addendum)
Uncontrolled. A1C today. Continue Levemir 22 units BID. Referral to ophthalmology placed.

## 2015-12-30 NOTE — Assessment & Plan Note (Signed)
Uncontrolled. Increasing Crestor to 20 mg daily.

## 2016-02-05 LAB — HM DIABETES EYE EXAM

## 2016-02-10 ENCOUNTER — Encounter: Payer: Self-pay | Admitting: Family Medicine

## 2016-04-09 ENCOUNTER — Emergency Department: Payer: Medicare Other

## 2016-04-09 ENCOUNTER — Emergency Department
Admission: EM | Admit: 2016-04-09 | Discharge: 2016-04-09 | Disposition: A | Discharge status: Home / Self Care | Payer: Medicare Other | Attending: Emergency Medicine | Admitting: Emergency Medicine

## 2016-04-09 ENCOUNTER — Encounter: Payer: Self-pay | Admitting: Emergency Medicine

## 2016-04-09 DIAGNOSIS — Z87891 Personal history of nicotine dependence: Secondary | ICD-10-CM | POA: Insufficient documentation

## 2016-04-09 DIAGNOSIS — I129 Hypertensive chronic kidney disease with stage 1 through stage 4 chronic kidney disease, or unspecified chronic kidney disease: Secondary | ICD-10-CM | POA: Insufficient documentation

## 2016-04-09 DIAGNOSIS — Z7982 Long term (current) use of aspirin: Secondary | ICD-10-CM | POA: Insufficient documentation

## 2016-04-09 DIAGNOSIS — E1122 Type 2 diabetes mellitus with diabetic chronic kidney disease: Secondary | ICD-10-CM | POA: Diagnosis not present

## 2016-04-09 DIAGNOSIS — N183 Chronic kidney disease, stage 3 (moderate): Secondary | ICD-10-CM | POA: Diagnosis not present

## 2016-04-09 DIAGNOSIS — Z79899 Other long term (current) drug therapy: Secondary | ICD-10-CM | POA: Insufficient documentation

## 2016-04-09 DIAGNOSIS — N200 Calculus of kidney: Secondary | ICD-10-CM | POA: Diagnosis not present

## 2016-04-09 DIAGNOSIS — R109 Unspecified abdominal pain: Secondary | ICD-10-CM | POA: Diagnosis present

## 2016-04-09 DIAGNOSIS — Z85038 Personal history of other malignant neoplasm of large intestine: Secondary | ICD-10-CM | POA: Diagnosis not present

## 2016-04-09 DIAGNOSIS — Z794 Long term (current) use of insulin: Secondary | ICD-10-CM | POA: Insufficient documentation

## 2016-04-09 LAB — URINALYSIS COMPLETE WITH MICROSCOPIC (ARMC ONLY)
Bacteria, UA: NONE SEEN
Bilirubin Urine: NEGATIVE
Glucose, UA: >500 mg/dL — AB
KETONES UR: NEGATIVE mg/dL
LEUKOCYTES UA: NEGATIVE
NITRITE: NEGATIVE
PH: 7 (ref 5.0–8.0)
PROTEIN: 100 mg/dL — AB
SPECIFIC GRAVITY, URINE: 1.012 (ref 1.005–1.030)
SQUAMOUS EPITHELIAL / LPF: NONE SEEN

## 2016-04-09 LAB — CBC
HEMATOCRIT: 38.5 % — AB (ref 40.0–52.0)
HEMOGLOBIN: 13 g/dL (ref 13.0–18.0)
MCH: 31 pg (ref 26.0–34.0)
MCHC: 33.9 g/dL (ref 32.0–36.0)
MCV: 91.4 fL (ref 80.0–100.0)
Platelets: 174 10*3/uL (ref 150–440)
RBC: 4.21 MIL/uL — ABNORMAL LOW (ref 4.40–5.90)
RDW: 13.4 % (ref 11.5–14.5)
WBC: 12.1 10*3/uL — AB (ref 3.8–10.6)

## 2016-04-09 LAB — COMPREHENSIVE METABOLIC PANEL
ALBUMIN: 4.4 g/dL (ref 3.5–5.0)
ALT: 22 U/L (ref 17–63)
ANION GAP: 9 (ref 5–15)
AST: 22 U/L (ref 15–41)
Alkaline Phosphatase: 57 U/L (ref 38–126)
BILIRUBIN TOTAL: 1 mg/dL (ref 0.3–1.2)
BUN: 33 mg/dL — ABNORMAL HIGH (ref 6–20)
CO2: 28 mmol/L (ref 22–32)
Calcium: 9.4 mg/dL (ref 8.9–10.3)
Chloride: 100 mmol/L — ABNORMAL LOW (ref 101–111)
Creatinine, Ser: 1.82 mg/dL — ABNORMAL HIGH (ref 0.61–1.24)
GFR calc Af Amer: 40 mL/min — ABNORMAL LOW (ref >60)
GFR calc non Af Amer: 35 mL/min — ABNORMAL LOW (ref >60)
GLUCOSE: 199 mg/dL — AB (ref 65–99)
POTASSIUM: 4.1 mmol/L (ref 3.5–5.1)
Sodium: 137 mmol/L (ref 135–145)
TOTAL PROTEIN: 8.3 g/dL — AB (ref 6.5–8.1)

## 2016-04-09 LAB — GLUCOSE, CAPILLARY: GLUCOSE-CAPILLARY: 178 mg/dL — AB (ref 65–99)

## 2016-04-09 LAB — LIPASE, BLOOD: LIPASE: 17 U/L (ref 11–51)

## 2016-04-09 MED ORDER — ONDANSETRON HCL 4 MG/2ML IJ SOLN
4.0000 mg | Freq: Once | INTRAMUSCULAR | Status: AC
  Administered 2016-04-09: 4 mg via INTRAVENOUS

## 2016-04-09 MED ORDER — HYDROCODONE-ACETAMINOPHEN 5-325 MG PO TABS
1.0000 | ORAL_TABLET | Freq: Four times a day (QID) | ORAL | 0 refills | Status: DC | PRN
Start: 2016-04-09 — End: 2016-04-27

## 2016-04-09 MED ORDER — ONDANSETRON HCL 4 MG/2ML IJ SOLN
4.0000 mg | Freq: Once | INTRAMUSCULAR | Status: AC
  Administered 2016-04-09: 4 mg via INTRAVENOUS
  Filled 2016-04-09: qty 2

## 2016-04-09 MED ORDER — DEXTROSE 5 % IV SOLN
1.0000 g | Freq: Once | INTRAVENOUS | Status: AC
  Administered 2016-04-09: 1 g via INTRAVENOUS
  Filled 2016-04-09: qty 10

## 2016-04-09 MED ORDER — HYDRALAZINE HCL 20 MG/ML IJ SOLN
5.0000 mg | Freq: Once | INTRAMUSCULAR | Status: AC
  Administered 2016-04-09: 5 mg via INTRAVENOUS

## 2016-04-09 MED ORDER — HYDRALAZINE HCL 20 MG/ML IJ SOLN
INTRAMUSCULAR | Status: AC
  Administered 2016-04-09: 5 mg via INTRAVENOUS
  Filled 2016-04-09: qty 1

## 2016-04-09 MED ORDER — IOPAMIDOL (ISOVUE-300) INJECTION 61%
75.0000 mL | Freq: Once | INTRAVENOUS | Status: AC | PRN
  Administered 2016-04-09: 75 mL via INTRAVENOUS

## 2016-04-09 MED ORDER — ONDANSETRON HCL 4 MG/2ML IJ SOLN
INTRAMUSCULAR | Status: AC
  Administered 2016-04-09: 4 mg via INTRAVENOUS
  Filled 2016-04-09: qty 2

## 2016-04-09 MED ORDER — TAMSULOSIN HCL 0.4 MG PO CAPS
0.4000 mg | ORAL_CAPSULE | Freq: Once | ORAL | Status: AC
Start: 2016-04-09 — End: 2016-04-09
  Administered 2016-04-09: 0.4 mg via ORAL
  Filled 2016-04-09: qty 1

## 2016-04-09 MED ORDER — MORPHINE SULFATE (PF) 4 MG/ML IV SOLN
4.0000 mg | Freq: Once | INTRAVENOUS | Status: AC
  Administered 2016-04-09: 4 mg via INTRAVENOUS
  Filled 2016-04-09: qty 1

## 2016-04-09 MED ORDER — ONDANSETRON HCL 4 MG PO TABS: 4.0000 mg | ORAL_TABLET | Freq: Three times a day (TID) | ORAL | 0 refills | Status: DC | PRN

## 2016-04-09 MED ORDER — IOPAMIDOL (ISOVUE-300) INJECTION 61%
30.0000 mL | Freq: Once | INTRAVENOUS | Status: AC | PRN
  Administered 2016-04-09: 30 mL via ORAL

## 2016-04-09 MED ORDER — TAMSULOSIN HCL 0.4 MG PO CAPS: 0.4000 mg | ORAL_CAPSULE | Freq: Every day | ORAL | 0 refills | Status: DC

## 2016-04-09 MED ORDER — CEPHALEXIN 500 MG PO CAPS: 500.0000 mg | ORAL_CAPSULE | Freq: Three times a day (TID) | ORAL | 0 refills | Status: DC

## 2016-04-09 NOTE — ED Triage Notes (Signed)
Wife states not feeling well x 1 week. abd pain - generalized. Vomited once today. States feels constipated - last bm on thurs

## 2016-04-09 NOTE — Discharge Instructions (Signed)
Please seek medical attention for any high fevers, chest pain, shortness of breath, change in behavior, persistent vomiting, bloody stool or any other new or concerning symptoms.  

## 2016-04-09 NOTE — ED Provider Notes (Signed)
[facility] Emergency Department Provider Note   ____________________________________________   I have reviewed the triage vital signs and the nursing notes.   HISTORY  Chief Complaint Abdominal Pain (generalized) and Constipation (since thurs)   History limited by: Not Limited   HPI Jacob Buck is a 75 y.o. male who presents to the emergency department today because of concern for nausea, vomiting and abdominal pain. He states that he has been feeling unwell for the past week. Cannot perfectly describe what that meant, however it appears he has felt fatigued. The vomiting started today. Multiple episodes. Non bloody. Associated with abdominal pain. No diarrhea. Last bowel movement 3 days ago. Non bloody. History of colon cancer s/p resection with intestinal resection roughly 25 years ago.   Past Medical History:  Diagnosis Date  . Arthritis 2008  . Cancer [facility]) 1991   Colon; right kidney ?  . Cardiomyopathy ([place])    a. ? isch vs nicm;  b. 05/2015 Echo: EF 35-40%, mild MR, mildly dil LA, poor acoustic windows.  . CKD (chronic kidney disease), stage III   . Diverticulosis   . DM type 2 (diabetes mellitus, type 2) ([place]) 2008  . Hyperlipidemia   . Hypertension   . Stroke [facility])    a. 05/2015 MRI: acute to early subacute R thalamic infarct, chronic L thalamic infarct, late subacute to chronic right occipital infarct;  b. 05/2015 Carotid U/S: RICA [id number], LICA [id number], patent vertebrals;  b. 06/2015 Event Monitor: RSR, rare PVC's.    Patient Active Problem List   Diagnosis Date Noted  . Mild obstructive sleep apnea 08/02/2015  . Stroke ([place]) 07/21/2015  . Morbid obesity ([place]) 07/21/2015  . Cardiomyopathy, ischemic 06/12/2015  . Anemia 05/31/2015  . Essential hypertension 05/31/2015  . Hyperlipidemia 05/31/2015  . Obesity 05/31/2015  . Non-compliant behavior 12/09/2014  . Type 2 diabetes, uncontrolled, with renal manifestation ([place]) 06/05/2014  .  CKD stage 3 due to type 2 diabetes mellitus ([place]) 06/05/2014  . Diabetic retinopathy ([place]) 06/05/2014  . Diabetic neuropathy ([place]) 06/05/2014  . Status post amputation of lesser toe of right foot ([place]) 06/05/2014  . Essential hypertension, benign 06/05/2014  . History of colon cancer 06/05/2014    Past Surgical History:  Procedure Laterality Date  . COLON SURGERY  2008   carcinoid  . COLONOSCOPY  2003, 2014  . CYSTOSCOPY WITH DIRECT VISION INTERNAL URETHROTOMY N/A 04/28/2015   Procedure: CYSTOSCOPY WITH DIRECT VISION INTERNAL URETHROTOMY WITH HOLMIUM LASER ;  Surgeon: Royston Cowper, MD;  Location: [facility] ORS;  Service: Urology;  Laterality: N/A;  . FOOT SURGERY Right 2013    Prior to Admission medications   Medication Sig Start Date End Date Taking? Authorizing Provider  aspirin 325 MG tablet Take 1 tablet (325 mg total) by mouth daily. 05/31/15   Theodoro Grist, MD  carvedilol (COREG) 25 MG tablet Take 1 tablet (25 mg total) by mouth 2 (two) times daily with a meal. 12/29/15   Coral Spikes, DO  docusate sodium (COLACE) 100 MG capsule Take 2 capsules (200 mg total) by mouth 2 (two) times daily. Patient taking differently: Take 200 mg by mouth as needed.  04/28/15   Royston Cowper, MD  Insulin Detemir (LEVEMIR FLEXTOUCH) 100 UNIT/ML Pen Inject 22 Units into the skin 2 (two) times daily. 12/28/15   Coral Spikes, DO  Insulin Pen Needle (BD PEN NEEDLE NANO U/F) 32G X 4 MM MISC Use for insulin administration three times daily 06/05/14  Haydee Monica, MD  lisinopril (PRINIVIL,ZESTRIL) 40 MG tablet Take 1 tablet (40 mg total) by mouth daily. 06/12/15   Minna Merritts, MD  rosuvastatin (CRESTOR) 20 MG tablet Take 1 tablet (20 mg total) by mouth daily. 12/29/15   Coral Spikes, DO  tamsulosin (FLOMAX) 0.4 MG CAPS capsule TAKE ONE CAPSULE BY MOUTH ONCE DAILY 08/21/15   Rubbie Battiest, NP    Allergies Toujeo solostar [insulin glargine]  Family History  Problem Relation Age of Onset  . Heart  disease Father     mild MI  . Diabetes Sister   . Alzheimer's disease Sister   . Diabetes Brother   . Alzheimer's disease Brother     Social History Social History  Substance Use Topics  . Smoking status: Former Research scientist (life sciences)  . Smokeless tobacco: Never Used  . Alcohol use No    Review of Systems  Constitutional: Negative for fever. Cardiovascular: Negative for chest pain. Respiratory: Negative for shortness of breath. Gastrointestinal: Positive for abdominal pain, vomiting. Neurological: Negative for headaches, focal weakness or numbness.  10-point ROS otherwise negative.  ____________________________________________   PHYSICAL EXAM:  VITAL SIGNS: ED Triage Vitals  Enc Vitals Group     BP 04/09/16 1645 (!) 154/81     Pulse Rate 04/09/16 1645 69     Resp 04/09/16 1645 18     Temp 04/09/16 1645 98.3 F (36.8 C)     Temp src --      SpO2 04/09/16 1645 99 %     Weight 04/09/16 1646 248 lb (112.5 kg)     Height 04/09/16 1646 6\' 2"  (1.88 m)     Head Circumference --      Peak Flow --      Pain Score 04/09/16 1646 10   Constitutional: Alert and oriented.  Eyes: Conjunctivae are normal. Normal extraocular movements. ENT   Head: Normocephalic and atraumatic.   Nose: No congestion/rhinnorhea.   Mouth/Throat: Mucous membranes are moist.   Neck: No stridor. Hematological/Lymphatic/Immunilogical: No cervical lymphadenopathy. Cardiovascular: Normal rate, regular rhythm.  No murmurs, rubs, or gallops. Respiratory: Normal respiratory effort without tachypnea nor retractions. Breath sounds are clear and equal bilaterally. No wheezes/rales/rhonchi. Gastrointestinal: Soft and nontender. No distention. Surgical scar present on right half of abdomen.  Genitourinary: Deferred Musculoskeletal: Normal range of motion in all extremities. No lower extremity edema. Neurologic:  Normal speech and language. No gross focal neurologic deficits are appreciated.  Skin:  Skin is  warm, dry and intact. No rash noted. Psychiatric: Mood and affect are normal. Speech and behavior are normal. Patient exhibits appropriate insight and judgment.  ____________________________________________    LABS (pertinent positives/negatives)  Labs Reviewed  GLUCOSE, CAPILLARY - Abnormal; Notable for the following:       Result Value   Glucose-Capillary 178 (*)    All other components within normal limits  COMPREHENSIVE METABOLIC PANEL - Abnormal; Notable for the following:    Chloride 100 (*)    Glucose, Bld 199 (*)    BUN 33 (*)    Creatinine, Ser 1.82 (*)    Total Protein 8.3 (*)    GFR calc non Af Amer 35 (*)    GFR calc Af Amer 40 (*)    All other components within normal limits  CBC - Abnormal; Notable for the following:    WBC 12.1 (*)    RBC 4.21 (*)    HCT 38.5 (*)    All other components within normal limits  URINALYSIS COMPLETEWITH MICROSCOPIC (  [facility] ONLY) - Abnormal; Notable for the following:    Color, Urine YELLOW (*)    APPearance HAZY (*)    Glucose, UA >500 (*)    Hgb urine dipstick 3+ (*)    Protein, ur 100 (*)    All other components within normal limits  LIPASE, BLOOD     ____________________________________________   EKG  None  ____________________________________________    RADIOLOGY  CT head IMPRESSION:  1. There is a 2 mm stone in the distal left ureter as well as small  stones at the left UVJ and another just within the left side of the  bladder. This may explain the patient's symptoms. There is resulting  left ureterectasis but no hydronephrosis.  2. There are nonobstructive stones within both kidneys as well.  3. Atherosclerotic change in the aorta and iliac vessels.     ____________________________________________   PROCEDURES  Procedures  ____________________________________________   INITIAL IMPRESSION / ASSESSMENT AND PLAN / ED COURSE  Pertinent labs & imaging results that were available during my care of the  patient were reviewed by me and considered in my medical decision making (see chart for details).  Workup is concerning for kidney stone in the UVJ. Initially patient had a very minimal leukocytosis and the serum and some white blood cells in the urine. Given this finding will give patient does of IV antibiotics. Will have patient follow-up with his urologist Dr. Yves Dill. ____________________________________________   FINAL CLINICAL IMPRESSION(S) / ED DIAGNOSES  Final diagnoses:  Kidney stone     Note: This dictation was prepared with Dragon dictation. Any transcriptional errors that result from this process are unintentional    Nance Pear, MD 04/09/16 2312

## 2016-04-09 NOTE — ED Notes (Signed)
Patient transported to CT 

## 2016-04-27 ENCOUNTER — Ambulatory Visit (INDEPENDENT_AMBULATORY_CARE_PROVIDER_SITE_OTHER): Payer: Medicare Other | Admitting: Cardiovascular Disease

## 2016-04-27 ENCOUNTER — Encounter: Payer: Self-pay | Admitting: Cardiovascular Disease

## 2016-04-27 VITALS — BP 188/110 | HR 80 | Ht 74.0 in | Wt 236.5 lb

## 2016-04-27 DIAGNOSIS — Z794 Long term (current) use of insulin: Secondary | ICD-10-CM

## 2016-04-27 DIAGNOSIS — I10 Essential (primary) hypertension: Secondary | ICD-10-CM | POA: Diagnosis not present

## 2016-04-27 DIAGNOSIS — I700 Atherosclerosis of aorta: Secondary | ICD-10-CM | POA: Insufficient documentation

## 2016-04-27 DIAGNOSIS — I2510 Atherosclerotic heart disease of native coronary artery without angina pectoris: Secondary | ICD-10-CM | POA: Diagnosis not present

## 2016-04-27 DIAGNOSIS — E1165 Type 2 diabetes mellitus with hyperglycemia: Secondary | ICD-10-CM

## 2016-04-27 DIAGNOSIS — IMO0002 Reserved for concepts with insufficient information to code with codable children: Secondary | ICD-10-CM

## 2016-04-27 DIAGNOSIS — I255 Ischemic cardiomyopathy: Secondary | ICD-10-CM | POA: Diagnosis not present

## 2016-04-27 DIAGNOSIS — E1122 Type 2 diabetes mellitus with diabetic chronic kidney disease: Secondary | ICD-10-CM

## 2016-04-27 DIAGNOSIS — N183 Chronic kidney disease, stage 3 (moderate): Secondary | ICD-10-CM

## 2016-04-27 MED ORDER — CARVEDILOL 25 MG PO TABS: 25.0000 mg | ORAL_TABLET | Freq: Two times a day (BID) | ORAL | 3 refills | Status: AC

## 2016-04-27 NOTE — Progress Notes (Signed)
Cardiology Office Note  Date:  04/27/2016   ID:  Bobbye, Sapia 1941-01-07, MRN [id number]  PCP:  Coral Spikes, DO   Chief Complaint  Patient presents with  . other    6 month f/u. Meds reviewed verbally with pt.    HPI:   Mr. Lell is a 75 year old gentleman With a history of stroke, ischemic cardiomyopathy, coronary artery disease, aortic atherosclerosis, Long history of diabetes type II with A1c 7.5, hypertension, hyperlipidemia Who presents for follow-up of his hypertension, coronary disease Ejection fraction 35%  He reports that he Ran out of coreg for 2 days, BP elevated today Feels he is getting stronger, Uses a walker, Gets up and down by himself now, does not need help from family No exercise program  occasional ankle edema which seems to come and go Denies significant shortness of breath or chest pain on exertion. Sedentary, only walks with a walker  CT scan October 2017 reviewed with him in detail, images pulled up in the office showing significant Coronary calcium in all 3 vessels  Significant Aortic athero  Labs reviewed with him LDL 66,  HBA1C 7.5  Previous carotid ultrasound showing <49 % disease bilaterally  EKG on today's visit shows normal sinus rhythm with rate 80 bpm, unable to exclude old inferior MI, old anterolateral MI  Other past medical history reviewed Recently admitted to the hospital with slurred speech, found to have acute stroke MRI of the brain showing new stroke as well as 2 prior strokes He reports symptoms date back to March 2016 when he had acute onset right arm weakness, numbness of his tongue on the right Diagnosed with stroke at that time  Previous echocardiogram showing ejection fraction 35%, lateral wall hypokinesis He was started on full dose aspirin.   PMH:   has a past medical history of Arthritis (2008); Cancer ([place]) (1991); Cardiomyopathy ([place]); CKD (chronic kidney disease), stage III; Diverticulosis; DM type 2  (diabetes mellitus, type 2) ([place]) (2008); Hyperlipidemia; Hypertension; Kidney stone; and Stroke [facility]).  PSH:    Past Surgical History:  Procedure Laterality Date  . COLON SURGERY  2008   carcinoid  . COLONOSCOPY  2003, 2014  . CYSTOSCOPY WITH DIRECT VISION INTERNAL URETHROTOMY N/A 04/28/2015   Procedure: CYSTOSCOPY WITH DIRECT VISION INTERNAL URETHROTOMY WITH HOLMIUM LASER ;  Surgeon: Royston Cowper, MD;  Location: [facility] ORS;  Service: Urology;  Laterality: N/A;  . FOOT SURGERY Right 2013    Current Outpatient Prescriptions  Medication Sig Dispense Refill  . aspirin 325 MG tablet Take 1 tablet (325 mg total) by mouth daily. 30 tablet 5  . carvedilol (COREG) 25 MG tablet Take 1 tablet (25 mg total) by mouth 2 (two) times daily with a meal. 180 tablet 3  . docusate sodium (COLACE) 100 MG capsule Take 2 capsules (200 mg total) by mouth 2 (two) times daily. (Patient taking differently: Take 200 mg by mouth as needed. ) 120 capsule 3  . furosemide (LASIX) 20 MG tablet Take 20 tablets by mouth as needed.    . Insulin Detemir (LEVEMIR FLEXTOUCH) 100 UNIT/ML Pen Inject 22 Units into the skin 2 (two) times daily. 15 mL 0  . Insulin Pen Needle (BD PEN NEEDLE NANO U/F) 32G X 4 MM MISC Use for insulin administration three times daily 100 each 11  . lisinopril (PRINIVIL,ZESTRIL) 40 MG tablet Take 1 tablet (40 mg total) by mouth daily. 30 tablet 3  . rosuvastatin (CRESTOR) 20 MG tablet Take 1 tablet (  20 mg total) by mouth daily. 90 tablet 3  . tamsulosin (FLOMAX) 0.4 MG CAPS capsule TAKE ONE CAPSULE BY MOUTH ONCE DAILY 30 capsule 11   No current facility-administered medications for this visit.      Allergies:   Toujeo solostar [insulin glargine]   Social History:  The patient  reports that he has quit smoking. He has never used smokeless tobacco. He reports that he does not drink alcohol or use drugs.   Family History:   family history includes Alzheimer's disease in his brother and sister;  Diabetes in his brother and sister; Heart disease in his father.    Review of Systems: Review of Systems  Constitutional: Negative.   Respiratory: Negative.   Cardiovascular: Negative.   Gastrointestinal: Negative.   Musculoskeletal:       Gait instability, walks with a walker  Neurological: Negative.   Psychiatric/Behavioral: Negative.   All other systems reviewed and are negative.    PHYSICAL EXAM: VS:  BP (!) 188/110 (BP Location: Left Arm, Patient Position: Sitting, Cuff Size: Normal)   Pulse 80   Ht 6\' 2"  (1.88 m)   Wt 236 lb 8 oz (107.3 kg)   BMI 30.36 kg/m  , BMI Body mass index is 30.36 kg/m. GEN: Well nourished, well developed, in no acute distress  HEENT: normal  Neck: no JVD, carotid bruits, or masses Cardiac: RRR; no murmurs, rubs, or gallops,no edema  Respiratory:  clear to auscultation bilaterally, normal work of breathing GI: soft, nontender, nondistended, + BS MS: no deformity or atrophy  Skin: warm and dry, no rash Neuro:  Strength and sensation are intact Psych: euthymic mood, full affect    Recent Labs: 05/31/2015: TSH 2.044 04/09/2016: ALT 22; BUN 33; Creatinine, Ser 1.82; Hemoglobin 13.0; Platelets 174; Potassium 4.1; Sodium 137    Lipid Panel Lab Results  Component Value Date   CHOL 143 12/29/2015   HDL 36.00 (L) 12/29/2015   LDLCALC 131 (H) 05/31/2015   TRIG 245.0 (H) 12/29/2015      Wt Readings from Last 3 Encounters:  04/27/16 236 lb 8 oz (107.3 kg)  04/09/16 248 lb (112.5 kg)  12/29/15 244 lb 8 oz (110.9 kg)       ASSESSMENT AND PLAN:  Essential hypertension, benign - Plan: EKG 12-Lead Blood pressure markedly elevated today, he ran out of his medications. Recommended he buy a blood pressure cuff, monitor blood pressure at home after he gets back on his medications  Cardiomyopathy, ischemic - Plan: EKG 12-Lead Previous ejection fraction 35%, no recent ischemia workup Denies any symptoms but is not active, walks with a  walker. CT scan showing diffuse three-vessel coronary disease, heavy He is declining stress testing at this time, prefers to think about it   Coronary artery disease involving native heart without angina pectoris, unspecified vessel or lesion type Heavy three-vessel coronary disease , declining stress testing or other workup  Would recommend aggressive lipid management, goal LDL less than 70  Aggressive diabetes management, A1c in the 6 range   Aortic atherosclerosis ([place]) Images shown to him in detail in the office, diffuse aortic atherosclerosis extending down iliac arteries, femoral arteries   Uncontrolled type 2 diabetes mellitus with stage 3 chronic kidney disease, with long-term current use of insulin ([place]) We have encouraged continued exercise, careful diet management in an effort to lose weight.    Total encounter time more than 25 minutes  Greater than 50% was spent in counseling and coordination of care with the patient  Disposition:   F/U  6 months   Orders Placed This Encounter  Procedures  . EKG 12-Lead     Signed, Esmond Plants, M.D., Ph.D. 04/27/2016  [place], [place]

## 2016-04-27 NOTE — Patient Instructions (Signed)
Medication Instructions:   No medication changes made  Please monitor your blood pressure at home Call if numbers run high  Labwork:  No new labs needed  Testing/Procedures:  No further testing at this time   Follow-Up: It was a pleasure seeing you in the office today. Please call us if you have new issues that need to be addressed before your next appt.  [phone]  Your physician wants you to follow-up in: 6 months.  You will receive a reminder letter in the mail two months in advance. If you don't receive a letter, please call our office to schedule the follow-up appointment.  If you need a refill on your cardiac medications before your next appointment, please call your pharmacy.

## 2016-07-13 ENCOUNTER — Other Ambulatory Visit: Payer: Self-pay | Admitting: Nurse Practitioner

## 2016-07-14 NOTE — Telephone Encounter (Signed)
Okay to refill? Per medication list, this was sent in June by [facility] with no refills.

## 2016-10-06 ENCOUNTER — Encounter: Payer: Self-pay | Admitting: Emergency Medicine

## 2016-10-06 ENCOUNTER — Emergency Department: Payer: Medicare Other

## 2016-10-06 ENCOUNTER — Inpatient Hospital Stay: Payer: Medicare Other

## 2016-10-06 ENCOUNTER — Inpatient Hospital Stay
Admission: EM | Admit: 2016-10-06 | Discharge: 2016-10-08 | DRG: 065 | Disposition: A | Discharge status: Home Health | Payer: Medicare Other | Attending: Internal Medicine | Admitting: Internal Medicine

## 2016-10-06 DIAGNOSIS — R29703 NIHSS score 3: Secondary | ICD-10-CM | POA: Diagnosis present

## 2016-10-06 DIAGNOSIS — I130 Hypertensive heart and chronic kidney disease with heart failure and stage 1 through stage 4 chronic kidney disease, or unspecified chronic kidney disease: Secondary | ICD-10-CM | POA: Diagnosis present

## 2016-10-06 DIAGNOSIS — E860 Dehydration: Secondary | ICD-10-CM | POA: Diagnosis present

## 2016-10-06 DIAGNOSIS — R778 Other specified abnormalities of plasma proteins: Secondary | ICD-10-CM | POA: Diagnosis present

## 2016-10-06 DIAGNOSIS — I5022 Chronic systolic (congestive) heart failure: Secondary | ICD-10-CM | POA: Diagnosis present

## 2016-10-06 DIAGNOSIS — R7989 Other specified abnormal findings of blood chemistry: Secondary | ICD-10-CM

## 2016-10-06 DIAGNOSIS — Z7982 Long term (current) use of aspirin: Secondary | ICD-10-CM

## 2016-10-06 DIAGNOSIS — Z833 Family history of diabetes mellitus: Secondary | ICD-10-CM | POA: Diagnosis not present

## 2016-10-06 DIAGNOSIS — H538 Other visual disturbances: Secondary | ICD-10-CM | POA: Diagnosis present

## 2016-10-06 DIAGNOSIS — Z8673 Personal history of transient ischemic attack (TIA), and cerebral infarction without residual deficits: Secondary | ICD-10-CM

## 2016-10-06 DIAGNOSIS — N179 Acute kidney failure, unspecified: Secondary | ICD-10-CM | POA: Diagnosis present

## 2016-10-06 DIAGNOSIS — R531 Weakness: Secondary | ICD-10-CM | POA: Diagnosis present

## 2016-10-06 DIAGNOSIS — Z87891 Personal history of nicotine dependence: Secondary | ICD-10-CM | POA: Diagnosis not present

## 2016-10-06 DIAGNOSIS — Z85038 Personal history of other malignant neoplasm of large intestine: Secondary | ICD-10-CM

## 2016-10-06 DIAGNOSIS — N289 Disorder of kidney and ureter, unspecified: Secondary | ICD-10-CM

## 2016-10-06 DIAGNOSIS — Z794 Long term (current) use of insulin: Secondary | ICD-10-CM

## 2016-10-06 DIAGNOSIS — N183 Chronic kidney disease, stage 3 (moderate): Secondary | ICD-10-CM | POA: Diagnosis present

## 2016-10-06 DIAGNOSIS — Z79899 Other long term (current) drug therapy: Secondary | ICD-10-CM

## 2016-10-06 DIAGNOSIS — I63531 Cerebral infarction due to unspecified occlusion or stenosis of right posterior cerebral artery: Principal | ICD-10-CM | POA: Diagnosis present

## 2016-10-06 DIAGNOSIS — E785 Hyperlipidemia, unspecified: Secondary | ICD-10-CM | POA: Diagnosis present

## 2016-10-06 DIAGNOSIS — R4781 Slurred speech: Secondary | ICD-10-CM | POA: Diagnosis present

## 2016-10-06 DIAGNOSIS — I429 Cardiomyopathy, unspecified: Secondary | ICD-10-CM | POA: Diagnosis present

## 2016-10-06 DIAGNOSIS — Z8249 Family history of ischemic heart disease and other diseases of the circulatory system: Secondary | ICD-10-CM | POA: Diagnosis not present

## 2016-10-06 DIAGNOSIS — I6350 Cerebral infarction due to unspecified occlusion or stenosis of unspecified cerebral artery: Secondary | ICD-10-CM | POA: Diagnosis not present

## 2016-10-06 DIAGNOSIS — E1122 Type 2 diabetes mellitus with diabetic chronic kidney disease: Secondary | ICD-10-CM | POA: Diagnosis present

## 2016-10-06 LAB — URINALYSIS, COMPLETE (UACMP) WITH MICROSCOPIC
Bacteria, UA: NONE SEEN
Bilirubin Urine: NEGATIVE
GLUCOSE, UA: 150 mg/dL — AB
KETONES UR: NEGATIVE mg/dL
Leukocytes, UA: NEGATIVE
Nitrite: NEGATIVE
PROTEIN: 100 mg/dL — AB
Specific Gravity, Urine: 1.02 (ref 1.005–1.030)
pH: 5 (ref 5.0–8.0)

## 2016-10-06 LAB — APTT: APTT: 35 s (ref 24–36)

## 2016-10-06 LAB — CBC
HCT: 36.3 % — ABNORMAL LOW (ref 40.0–52.0)
Hemoglobin: 12.2 g/dL — ABNORMAL LOW (ref 13.0–18.0)
MCH: 30.6 pg (ref 26.0–34.0)
MCHC: 33.7 g/dL (ref 32.0–36.0)
MCV: 90.8 fL (ref 80.0–100.0)
Platelets: 159 10*3/uL (ref 150–440)
RBC: 3.99 MIL/uL — ABNORMAL LOW (ref 4.40–5.90)
RDW: 13.2 % (ref 11.5–14.5)
WBC: 10.7 10*3/uL — AB (ref 3.8–10.6)

## 2016-10-06 LAB — COMPREHENSIVE METABOLIC PANEL
ALT: 19 U/L (ref 17–63)
AST: 20 U/L (ref 15–41)
Albumin: 3.9 g/dL (ref 3.5–5.0)
Alkaline Phosphatase: 53 U/L (ref 38–126)
Anion gap: 9 (ref 5–15)
BUN: 45 mg/dL — AB (ref 6–20)
CALCIUM: 9 mg/dL (ref 8.9–10.3)
CHLORIDE: 101 mmol/L (ref 101–111)
CO2: 24 mmol/L (ref 22–32)
CREATININE: 2.47 mg/dL — AB (ref 0.61–1.24)
GFR calc Af Amer: 28 mL/min — ABNORMAL LOW (ref >60)
GFR, EST NON AFRICAN AMERICAN: 24 mL/min — AB (ref >60)
Glucose, Bld: 267 mg/dL — ABNORMAL HIGH (ref 65–99)
Potassium: 4.2 mmol/L (ref 3.5–5.1)
Sodium: 134 mmol/L — ABNORMAL LOW (ref 135–145)
TOTAL PROTEIN: 7.8 g/dL (ref 6.5–8.1)
Total Bilirubin: 1.4 mg/dL — ABNORMAL HIGH (ref 0.3–1.2)

## 2016-10-06 LAB — DIFFERENTIAL
BASOS ABS: 0.1 10*3/uL (ref 0–0.1)
BASOS PCT: 1 %
Eosinophils Absolute: 0.1 10*3/uL (ref 0–0.7)
Eosinophils Relative: 1 %
LYMPHS ABS: 1.4 10*3/uL (ref 1.0–3.6)
Lymphocytes Relative: 13 %
Monocytes Absolute: 0.6 10*3/uL (ref 0.2–1.0)
Monocytes Relative: 6 %
NEUTROS ABS: 8.5 10*3/uL — AB (ref 1.4–6.5)
Neutrophils Relative %: 79 %

## 2016-10-06 LAB — PROTIME-INR
INR: 0.98
Prothrombin Time: 13 seconds (ref 11.4–15.2)

## 2016-10-06 LAB — GLUCOSE, CAPILLARY: GLUCOSE-CAPILLARY: 168 mg/dL — AB (ref 65–99)

## 2016-10-06 LAB — TROPONIN I: TROPONIN I: 0.03 ng/mL — AB (ref <0.03)

## 2016-10-06 MED ORDER — AMLODIPINE BESYLATE 5 MG PO TABS
5.0000 mg | ORAL_TABLET | Freq: Every day | ORAL | Status: DC
  Administered 2016-10-06 – 2016-10-08 (×3): 5 mg via ORAL
  Filled 2016-10-06 (×3): qty 1

## 2016-10-06 MED ORDER — ENOXAPARIN SODIUM 40 MG/0.4ML ~~LOC~~ SOLN
40.0000 mg | SUBCUTANEOUS | Status: DC
  Administered 2016-10-06 – 2016-10-07 (×2): 40 mg via SUBCUTANEOUS
  Filled 2016-10-06 (×2): qty 0.4

## 2016-10-06 MED ORDER — CARVEDILOL 6.25 MG PO TABS
25.0000 mg | ORAL_TABLET | Freq: Two times a day (BID) | ORAL | Status: DC
  Administered 2016-10-06 – 2016-10-08 (×4): 25 mg via ORAL
  Filled 2016-10-06 (×4): qty 4

## 2016-10-06 MED ORDER — BISACODYL 5 MG PO TBEC
5.0000 mg | DELAYED_RELEASE_TABLET | Freq: Every day | ORAL | Status: DC | PRN
  Filled 2016-10-06: qty 1

## 2016-10-06 MED ORDER — INSULIN ASPART 100 UNIT/ML ~~LOC~~ SOLN
0.0000 [IU] | Freq: Three times a day (TID) | SUBCUTANEOUS | Status: DC
  Administered 2016-10-07 (×2): 2 [IU] via SUBCUTANEOUS
  Filled 2016-10-06 (×2): qty 2

## 2016-10-06 MED ORDER — ACETAMINOPHEN 650 MG RE SUPP: 650.0000 mg | Freq: Four times a day (QID) | RECTAL | Status: DC | PRN

## 2016-10-06 MED ORDER — ACETAMINOPHEN 325 MG PO TABS: 650.0000 mg | ORAL_TABLET | Freq: Four times a day (QID) | ORAL | Status: DC | PRN

## 2016-10-06 MED ORDER — ONDANSETRON HCL 4 MG/2ML IJ SOLN: 4.0000 mg | Freq: Four times a day (QID) | INTRAMUSCULAR | Status: DC | PRN

## 2016-10-06 MED ORDER — TRAZODONE HCL 50 MG PO TABS: 25.0000 mg | ORAL_TABLET | Freq: Every evening | ORAL | Status: DC | PRN

## 2016-10-06 MED ORDER — HYDRALAZINE HCL 50 MG PO TABS
25.0000 mg | ORAL_TABLET | Freq: Three times a day (TID) | ORAL | Status: DC
  Administered 2016-10-06 – 2016-10-08 (×5): 25 mg via ORAL
  Filled 2016-10-06 (×5): qty 1

## 2016-10-06 MED ORDER — SODIUM CHLORIDE 0.9 % IV SOLN
INTRAVENOUS | Status: DC
  Administered 2016-10-06 – 2016-10-08 (×2): via INTRAVENOUS

## 2016-10-06 MED ORDER — HYDROCODONE-ACETAMINOPHEN 5-325 MG PO TABS: 1.0000 | ORAL_TABLET | ORAL | Status: DC | PRN

## 2016-10-06 MED ORDER — INSULIN DETEMIR 100 UNIT/ML ~~LOC~~ SOLN
15.0000 [IU] | Freq: Two times a day (BID) | SUBCUTANEOUS | Status: DC
  Administered 2016-10-07 – 2016-10-08 (×4): 15 [IU] via SUBCUTANEOUS
  Filled 2016-10-06 (×5): qty 0.15

## 2016-10-06 MED ORDER — DOCUSATE SODIUM 100 MG PO CAPS
200.0000 mg | ORAL_CAPSULE | Freq: Two times a day (BID) | ORAL | Status: DC
  Administered 2016-10-06 – 2016-10-08 (×4): 200 mg via ORAL
  Filled 2016-10-06 (×4): qty 2

## 2016-10-06 MED ORDER — ROSUVASTATIN CALCIUM 20 MG PO TABS
20.0000 mg | ORAL_TABLET | Freq: Every day | ORAL | Status: DC
  Administered 2016-10-07 – 2016-10-08 (×2): 20 mg via ORAL
  Filled 2016-10-06 (×2): qty 1

## 2016-10-06 MED ORDER — ONDANSETRON HCL 4 MG PO TABS: 4.0000 mg | ORAL_TABLET | Freq: Four times a day (QID) | ORAL | Status: DC | PRN

## 2016-10-06 MED ORDER — ASPIRIN 325 MG PO TABS
325.0000 mg | ORAL_TABLET | Freq: Every day | ORAL | Status: DC
  Administered 2016-10-07: 325 mg via ORAL
  Filled 2016-10-06: qty 1

## 2016-10-06 MED ORDER — STROKE: EARLY STAGES OF RECOVERY BOOK
Freq: Once | Status: AC
  Administered 2016-10-06: 22:00:00

## 2016-10-06 NOTE — H&P (Signed)
[place] [facility] [place] NAME: Jacob Buck    MR#:  [id number]  DATE OF BIRTH:  07/03/1941  DATE OF ADMISSION:  10/06/2016  PRIMARY CARE PHYSICIAN: Coral Spikes, DO   REQUESTING/REFERRING PHYSICIAN: Dr.  Mariea Clonts  CHIEF COMPLAINT: Generalized weakness,   Chief Complaint  Patient presents with  . Hypertension  . Extremity Weakness    HISTORY OF PRESENT ILLNESS:  Jacob Buck  is a 76 y.o. male with a known history of Essential hypertension, chronic systolic heart failure, diabetes mellitus type 2 , history of previous stroke comes in with generalized weakness, patient could not get up this morning. Concerning generalized weakness, ambulates with difficulty family brought him here.Patient not eating well for the past 2-3 days. Also noticed speech troubles for the last 2-3 days. Pressure at home this morning 203/123. Send usually is able to use a walker but today morning and yesterday  family had to use wheelchair.  PAST MEDICAL HISTORY:   Past Medical History:  Diagnosis Date  . Arthritis 2008  . Cancer [facility]) 1991   Colon; right kidney ?  . Cardiomyopathy ([place])    a. ? isch vs nicm;  b. 05/2015 Echo: EF 35-40%, mild MR, mildly dil LA, poor acoustic windows.  . CKD (chronic kidney disease), stage III   . Diverticulosis   . DM type 2 (diabetes mellitus, type 2) ([place]) 2008  . Hyperlipidemia   . Hypertension   . Kidney stone   . Stroke [facility])    a. 05/2015 MRI: acute to early subacute R thalamic infarct, chronic L thalamic infarct, late subacute to chronic right occipital infarct;  b. 05/2015 Carotid U/S: RICA 4-33%I, LICA 9-51%O, patent vertebrals;  b. 06/2015 Event Monitor: RSR, rare PVC's.    PAST SURGICAL HISTOIRY:   Past Surgical History:  Procedure Laterality Date  . COLON SURGERY  2008   carcinoid  . COLONOSCOPY  2003, 2014  . CYSTOSCOPY WITH DIRECT VISION INTERNAL URETHROTOMY N/A 04/28/2015   Procedure: CYSTOSCOPY WITH  DIRECT VISION INTERNAL URETHROTOMY WITH HOLMIUM LASER ;  Surgeon: Royston Cowper, MD;  Location: [facility] ORS;  Service: Urology;  Laterality: N/A;  . FOOT SURGERY Right 2013    SOCIAL HISTORY:   Social History  Substance Use Topics  . Smoking status: Former Research scientist (life sciences)  . Smokeless tobacco: Never Used  . Alcohol use No    FAMILY HISTORY:   Family History  Problem Relation Age of Onset  . Heart disease Father     mild MI  . Diabetes Sister   . Alzheimer's disease Sister   . Diabetes Brother   . Alzheimer's disease Brother     DRUG ALLERGIES:   Allergies  Allergen Reactions  . Toujeo Solostar [Insulin Glargine] Other (See Comments)    Reaction : Numbness in arm, tingling    REVIEW OF SYSTEMS:  CONSTITUTIONAL: No fever, fatigue or weakness.  EYES: No blurred or double vision.  EARS, NOSE, AND THROAT: No tinnitus or ear pain.  RESPIRATORY: No cough, shortness of breath, wheezing or hemoptysis.  CARDIOVASCULAR: No chest pain, orthopnea, edema.  GASTROINTESTINAL: No nausea, vomiting, diarrhea or abdominal pain.  GENITOURINARY: No dysuria, hematuria.  ENDOCRINE: No polyuria, nocturia,  HEMATOLOGY: No anemia, easy bruising or bleeding SKIN: No rash or lesion. MUSCULOSKELETAL: No joint pain or arthritis.   NEUROLOGIC: No tingling, numbness, weakness.  PSYCHIATRY: No anxiety or depression.   MEDICATIONS AT HOME:   Prior to Admission medications   Medication  Sig Start Date End Date Taking? Authorizing Provider  aspirin 325 MG tablet Take 1 tablet (325 mg total) by mouth daily. 05/31/15  Yes Theodoro Grist, MD  carvedilol (COREG) 25 MG tablet Take 1 tablet (25 mg total) by mouth 2 (two) times daily with a meal. 04/27/16  Yes Minna Merritts, MD  docusate sodium (COLACE) 100 MG capsule Take 2 capsules (200 mg total) by mouth 2 (two) times daily. Patient taking differently: Take 200 mg by mouth as needed.  04/28/15  Yes Royston Cowper, MD  Insulin Detemir (LEVEMIR FLEXTOUCH) 100  UNIT/ML Pen Inject 22 Units into the skin 2 (two) times daily. 12/28/15  Yes Jayce G Cook, DO  lisinopril (PRINIVIL,ZESTRIL) 40 MG tablet Take 1 tablet (40 mg total) by mouth daily. 06/12/15  Yes Minna Merritts, MD  rosuvastatin (CRESTOR) 20 MG tablet Take 1 tablet (20 mg total) by mouth daily. 12/29/15  Yes Coral Spikes, DO  furosemide (LASIX) 20 MG tablet Take 20 tablets by mouth as needed. 02/09/16   Historical Provider, MD  Insulin Pen Needle (BD PEN NEEDLE NANO U/F) 32G X 4 MM MISC Use for insulin administration three times daily 06/05/14   Haydee Monica, MD  LEVEMIR FLEXTOUCH 100 UNIT/ML Pen INJECT 42 UNITS SUBCUTANEOUSLY AT BEDTIME Patient not taking: Reported on 10/06/2016 07/14/16   Coral Spikes, DO  tamsulosin (FLOMAX) 0.4 MG CAPS capsule TAKE ONE CAPSULE BY MOUTH ONCE DAILY Patient not taking: Reported on 10/06/2016 08/21/15   Rubbie Battiest, NP      VITAL SIGNS:  Blood pressure (!) 177/105, pulse 69, temperature 98.9 F (37.2 C), temperature source Oral, resp. rate 17, height 6\' 2"  (1.88 m), weight 104.3 kg (230 lb), SpO2 97 %.  PHYSICAL EXAMINATION:  GENERAL:  76 y.o.-year-old patient lying in the bed with no acute distress.  EYES: Pupils equal, round, reactive to light and accommodation. No scleral icterus. Extraocular muscles intact.  HEENT: Head atraumatic, normocephalic. Oropharynx and nasopharynx clear.  NECK:  Supple, no jugular venous distention. No thyroid enlargement, no tenderness.  LUNGS: Normal breath sounds bilaterally, no wheezing, rales,rhonchi or crepitation. No use of accessory muscles of respiration.  CARDIOVASCULAR: S1, S2 normal. No murmurs, rubs, or gallops.  ABDOMEN: Soft, nontender, nondistended. Bowel sounds present. No organomegaly or mass.  EXTREMITIES: No pedal edema, cyanosis, or clubbing.  NEUROLOGIC: Cranial nerves II through XII are intact. Muscle strength 3/5 on right and 5/5 on left  extremities. Sensation intact. Gait not checked.  PSYCHIATRIC: The  patient is alert and oriented x 3.  SKIN: No obvious rash, lesion, or ulcer.   LABORATORY PANEL:   CBC  Recent Labs Lab 10/06/16 1407  WBC 10.7*  HGB 12.2*  HCT 36.3*  PLT 159   ------------------------------------------------------------------------------------------------------------------  Chemistries   Recent Labs Lab 10/06/16 1407  NA 134*  K 4.2  CL 101  CO2 24  GLUCOSE 267*  BUN 45*  CREATININE 2.47*  CALCIUM 9.0  AST 20  ALT 19  ALKPHOS 53  BILITOT 1.4*   ------------------------------------------------------------------------------------------------------------------  Cardiac Enzymes  Recent Labs Lab 10/06/16 1407  TROPONINI 0.03*   ------------------------------------------------------------------------------------------------------------------  RADIOLOGY:  Ct Head Wo Contrast  Result Date: 10/06/2016 CLINICAL DATA:  Bilateral leg weakness and slurred speech for 2 days. EXAM: CT HEAD WITHOUT CONTRAST TECHNIQUE: Contiguous axial images were obtained from the base of the skull through the vertex without intravenous contrast. COMPARISON:  Brain MRI 05/30/2015.  Head CT 05/30/2015. FINDINGS: Brain: Low-attenuation in the medial right  temporal lobe, parietal lobe and occipital lobe is compatible with acute to subacute ischemia in a right PCA territory, superimposed on posterior chronic PCA infarct seen on previous imaging. No evidence for acute hemorrhage. No mass-effect or mass lesion. No abnormal extra-axial fluid collection. Diffuse loss of parenchymal volume is consistent with atrophy. Patchy low attenuation in the deep hemispheric and periventricular white matter is nonspecific, but likely reflects chronic microvascular ischemic demyelination. Vascular: Atherosclerotic calcification is visualized in the intracranial segments of the internal carotid arteries. No dense MCA sign. Major dural sinuses are unremarkable. Skull: No evidence for fracture. No worrisome  lytic or sclerotic lesion. Sinuses/Orbits: The visualized paranasal sinuses and mastoid air cells are clear. Visualized portions of the globes and intraorbital fat are unremarkable. Other: None. IMPRESSION: Acute to subacute infarct involving the right PCA distribution in this patient with history of previous PCA territory infarct. No evidence for substantial mass-effect or acute hemorrhage. Electronically Signed   By: Misty Stanley M.D.   On: 10/06/2016 14:53    EKG:   Orders placed or performed during the hospital encounter of 10/06/16  . ED EKG  . ED EKG  normal sinus rhythm with 72minute, no ST-T changes.  IMPRESSION AND PLAN:   , Generalized weakness, slurred speech and ambulatory difficulty likely secondary to acute on chronic kidney disease with dehydration but because of previous history of stroke admitted to the stroke unit for evaluating new stroke;   bilateral leg weakness with malignant hypertension, slurred speech, ambulatory difficulties: Physical therapy evaluation, MRI of the brain to evaluate new stroke, check carotid sono, ECHOCARDIOGRAM, CONTINUE ASPIRIN, STATINS;unknown duration,not a candidate for TPA.neurologist  Dr.Reynolds is contacted by ER MD.recommended admission for eval of new stroke.  2. Acute on chronic renal failure with chronic kidney disease stage III secondary to dehydration, family mentioned that he is not drinking and eating for past   One  -two days. Continue IV hydration, hold the Lasix, lisinopril, because of poor by mouth intake.  #3 malignant hypertension: The new coag at 25 mg by mouth twice a day, add hydralazine 25 mg every 8 hours, hold lisinopril, furosemide because of renal insufficiency. Up titrate the hydralazine for blood pressure less than 150/80  4/.Diabetes type II: Decreased by mouth intake for the past 2-3 day. Decrease the Levemir dose.   d/wThe plan with patient's family.  All the records are reviewed and case discussed with ED  provider. Management plans discussed with the patient, family and they are in agreement.  CODE STATUS: full  TOTAL TIME TAKING CARE OF THIS PATIENT: 55 minutes.    Epifanio Lesches M.D on 10/06/2016 at 5:45 PM  Between 7am to 6pm - Pager - [phone]  After 6pm go to [email or web address] - password [email or web address] [facility]  Office  [phone]  CC: Primary care physician; Coral Spikes, DO  Note: This dictation was prepared with Dragon dictation along with smaller phrase technology. Any transcriptional errors that result from this process are unintentional.

## 2016-10-06 NOTE — ED Notes (Signed)
Pt brought in by family for weakness x 2 days and hypertension. Pt has hx of stroke on rt with residual weakness. Uses a walker to ambulate. States his face has residual numbness but can feel when touched. Pt alert, oriented, able to give history with clear speech. States he feels "tired".

## 2016-10-06 NOTE — ED Provider Notes (Addendum)
[facility] Emergency Department Provider Note  ____________________________________________  Time seen: Approximately 4:37 PM  I have reviewed the triage vital signs and the nursing notes.   HISTORY  Chief Complaint Hypertension and Extremity Weakness    HPI Jacob Buck is a 76 y.o. male with a history of CVA 05/2015 on full daily aspirin, cardiomyopathy, HTN, DM, and with right lower extremity weakness, acute on chronic dysarthria, and blurred vision. The patient is accompanied by his family, and the exact timing of his symptoms is unclear. However, the patient generally uses a walker at baseline, noted that his right leg felt weak last night. This morning, his weakness was worse and he had to be placed in a wheelchair to go to the bathroom. In addition, his wife noted that at 4 AM, his speech was worse and more difficult to understand than baseline. However, for the past several days or weeks, the patient's speech has been worse and he has attributed that to not being compliant with his speech therapy. He also notes that over the last several days, his far vision has been blurred. This morning, family checked his blood pressure and it was 204 over 140s. Patient denies any other recent illness including cough or cold symptoms, fever or chills, dysuria, nausea vomiting or diarrhea. No other recent changes to his medications.   Past Medical History:  Diagnosis Date  . Arthritis 2008  . Cancer [facility]) 1991   Colon; right kidney ?  . Cardiomyopathy ([place])    a. ? isch vs nicm;  b. 05/2015 Echo: EF 35-40%, mild MR, mildly dil LA, poor acoustic windows.  . CKD (chronic kidney disease), stage III   . Diverticulosis   . DM type 2 (diabetes mellitus, type 2) ([place]) 2008  . Hyperlipidemia   . Hypertension   . Kidney stone   . Stroke [facility])    a. 05/2015 MRI: acute to early subacute R thalamic infarct, chronic L thalamic infarct, late subacute to chronic right occipital  infarct;  b. 05/2015 Carotid U/S: RICA 3-53%G, LICA 9-92%E, patent vertebrals;  b. 06/2015 Event Monitor: RSR, rare PVC's.    Patient Active Problem List   Diagnosis Date Noted  . Weakness 10/06/2016  . Aortic atherosclerosis ([place]) 04/27/2016  . Coronary artery disease involving native heart without angina pectoris 04/27/2016  . Mild obstructive sleep apnea 08/02/2015  . Stroke ([place]) 07/21/2015  . Morbid obesity ([place]) 07/21/2015  . Cardiomyopathy, ischemic 06/12/2015  . Anemia 05/31/2015  . Essential hypertension 05/31/2015  . Hyperlipidemia 05/31/2015  . Obesity 05/31/2015  . Non-compliant behavior 12/09/2014  . Type 2 diabetes, uncontrolled, with renal manifestation ([place]) 06/05/2014  . CKD stage 3 due to type 2 diabetes mellitus ([place]) 06/05/2014  . Diabetic retinopathy ([place]) 06/05/2014  . Diabetic neuropathy ([place]) 06/05/2014  . Status post amputation of lesser toe of right foot ([place]) 06/05/2014  . Essential hypertension, benign 06/05/2014  . History of colon cancer 06/05/2014    Past Surgical History:  Procedure Laterality Date  . COLON SURGERY  2008   carcinoid  . COLONOSCOPY  2003, 2014  . CYSTOSCOPY WITH DIRECT VISION INTERNAL URETHROTOMY N/A 04/28/2015   Procedure: CYSTOSCOPY WITH DIRECT VISION INTERNAL URETHROTOMY WITH HOLMIUM LASER ;  Surgeon: Royston Cowper, MD;  Location: [facility] ORS;  Service: Urology;  Laterality: N/A;  . FOOT SURGERY Right 2013      Allergies Toujeo solostar [insulin glargine]  Family History  Problem Relation Age of Onset  . Heart disease Father  mild MI  . Diabetes Sister   . Alzheimer's disease Sister   . Diabetes Brother   . Alzheimer's disease Brother     Social History Social History  Substance Use Topics  . Smoking status: Former Research scientist (life sciences)  . Smokeless tobacco: Never Used  . Alcohol use No    Review of Systems Constitutional: No fever/chills.No lightheadedness or syncope. Eyes: Positive blurred vision. ENT: No sore  throat. No congestion or rhinorrhea. Cardiovascular: Denies chest pain. Denies palpitations. Respiratory: Denies shortness of breath.  No cough. Gastrointestinal: No abdominal pain.  No nausea, no vomiting.  No diarrhea.  No constipation. Genitourinary: Negative for dysuria. Musculoskeletal: Negative for back pain. Skin: Negative for rash. Neurological: Mild intermittent headaches. Worsening dysarthria. Right lower extremity weakness. Denies numbness or tingling. Denies changes in mental status.   10-point ROS otherwise negative.  ____________________________________________   PHYSICAL EXAM:  VITAL SIGNS: ED Triage Vitals  Enc Vitals Group     BP 10/06/16 1409 (!) 143/73     Pulse Rate 10/06/16 1406 73     Resp 10/06/16 1406 18     Temp 10/06/16 1406 98.9 F (37.2 C)     Temp Source 10/06/16 1406 Oral     SpO2 10/06/16 1406 99 %     Weight 10/06/16 1404 230 lb (104.3 kg)     Height 10/06/16 1404 6\' 2"  (1.88 m)     Head Circumference --      Peak Flow --      Pain Score 10/06/16 1403 3     Pain Loc --      Pain Edu? --      Excl. in [place]? --     Constitutional: Alert and oriented. Chronically ill appearing but in no acute distress. Answers questions appropriately. Eyes: Conjunctivae are normal.  EOMI. PERRLA. No scleral icterus. Head: Atraumatic. Nose: No congestion/rhinnorhea. Mouth/Throat: Mucous membranes are moist.  Neck: No stridor.  Supple.  No JVD. No meningismus. Cardiovascular: Normal rate, regular rhythm. No murmurs, rubs or gallops.  Respiratory: Normal respiratory effort.  No accessory muscle use or retractions. Lungs CTAB.  No wheezes, rales or ronchi. Gastrointestinal: Obese. Soft, nontender and nondistended.  No guarding or rebound.  No peritoneal signs. Musculoskeletal: No LE edema.  Neurologic: Alert and oriented 3. Speech is dysarthric. Mild loss of the right nasolabial fold but symmetric smile. Tongue deviates mildly to the right. EOMI. PERRLA. No  horizontal or vertical nystagmus. No pronator drift. 5 out of 5 grip, biceps, triceps, hip flexors. Decreased plantar flexion and dorsi flexion bilaterally and symmetrically. Normal sensation to light touch in the bilateral upper and lower extremities, and face.  Skin:  Skin is warm, dry and intact. No rash noted. Psychiatric: Mood and affect are normal. Speech and behavior are normal.  Normal judgement.  ____________________________________________   LABS (all labs ordered are listed, but only abnormal results are displayed)  Labs Reviewed  CBC - Abnormal; Notable for the following:       Result Value   WBC 10.7 (*)    RBC 3.99 (*)    Hemoglobin 12.2 (*)    HCT 36.3 (*)    All other components within normal limits  DIFFERENTIAL - Abnormal; Notable for the following:    Neutro Abs 8.5 (*)    All other components within normal limits  COMPREHENSIVE METABOLIC PANEL - Abnormal; Notable for the following:    Sodium 134 (*)    Glucose, Bld 267 (*)    BUN 45 (*)  Creatinine, Ser 2.47 (*)    Total Bilirubin 1.4 (*)    GFR calc non Af Amer 24 (*)    GFR calc Af Amer 28 (*)    All other components within normal limits  TROPONIN I - Abnormal; Notable for the following:    Troponin I 0.03 (*)    All other components within normal limits  URINALYSIS, COMPLETE (UACMP) WITH MICROSCOPIC - Abnormal; Notable for the following:    Color, Urine YELLOW (*)    APPearance HAZY (*)    Glucose, UA 150 (*)    Hgb urine dipstick SMALL (*)    Protein, ur 100 (*)    Squamous Epithelial / LPF 0-5 (*)    All other components within normal limits  GLUCOSE, CAPILLARY - Abnormal; Notable for the following:    Glucose-Capillary 168 (*)    All other components within normal limits  PROTIME-INR  APTT  BASIC METABOLIC PANEL  CBC  HEMOGLOBIN A1C  LIPID PANEL  CBG MONITORING, ED   ____________________________________________  EKG  ED ECG REPORT I, Eula Listen, the attending physician,  personally viewed and interpreted this ECG.   Date: 10/06/2016  EKG Time: 1416  Rate: 71  Rhythm: normal sinus rhythm  Axis: leftward  Intervals:none  ST&T Change: no STEMI  ____________________________________________  RADIOLOGY  Dg Chest 2 View  Result Date: 10/06/2016 CLINICAL DATA:  Bilateral leg weakness for 2 days, slurred speech. Hypertensive. History of cancer and hypertension. EXAM: CHEST  2 VIEW COMPARISON:  Chest radiograph May 30, 2015 FINDINGS: Cardiac silhouette is upper limits of normal in size. Mediastinal silhouette is nonsuspicious. Mildly calcified aortic knob. Mildly elevated RIGHT hemidiaphragm is unchanged. No pleural effusion or focal consolidation. No pneumothorax. Mild degenerative change of the thoracic spine. IMPRESSION: Stable examination: Borderline cardiomegaly without acute pulmonary process. Electronically Signed   By: Elon Alas M.D.   On: 10/06/2016 18:19   Ct Head Wo Contrast  Result Date: 10/06/2016 CLINICAL DATA:  Bilateral leg weakness and slurred speech for 2 days. EXAM: CT HEAD WITHOUT CONTRAST TECHNIQUE: Contiguous axial images were obtained from the base of the skull through the vertex without intravenous contrast. COMPARISON:  Brain MRI 05/30/2015.  Head CT 05/30/2015. FINDINGS: Brain: Low-attenuation in the medial right temporal lobe, parietal lobe and occipital lobe is compatible with acute to subacute ischemia in a right PCA territory, superimposed on posterior chronic PCA infarct seen on previous imaging. No evidence for acute hemorrhage. No mass-effect or mass lesion. No abnormal extra-axial fluid collection. Diffuse loss of parenchymal volume is consistent with atrophy. Patchy low attenuation in the deep hemispheric and periventricular white matter is nonspecific, but likely reflects chronic microvascular ischemic demyelination. Vascular: Atherosclerotic calcification is visualized in the intracranial segments of the internal carotid  arteries. No dense MCA sign. Major dural sinuses are unremarkable. Skull: No evidence for fracture. No worrisome lytic or sclerotic lesion. Sinuses/Orbits: The visualized paranasal sinuses and mastoid air cells are clear. Visualized portions of the globes and intraorbital fat are unremarkable. Other: None. IMPRESSION: Acute to subacute infarct involving the right PCA distribution in this patient with history of previous PCA territory infarct. No evidence for substantial mass-effect or acute hemorrhage. Electronically Signed   By: Misty Stanley M.D.   On: 10/06/2016 14:53    ____________________________________________   PROCEDURES  Procedure(s) performed: None  Procedures  Critical Care performed: Yes, see critical care note(s) ____________________________________________   INITIAL IMPRESSION / ASSESSMENT AND PLAN / ED COURSE  Pertinent labs & imaging results that  were available during my care of the patient were reviewed by me and considered in my medical decision making (see chart for details).  76 y.o. male with multiple comorbidities including history of stroke on daily aspirin presenting with right lower extremity weakness, dysarthria, and blurred vision. Overall, the patient's blood pressure here is reassuring at 143/73. He does have multiple abnormal neurologic findings on examination. His CT scan does show an acute or subacute PCA.  I spoke with Dr. Doy Mince, who agrees with the plan for admission and follow-up MR of the brain to make sure he is not showering emboli into other parts of his brain.  CRITICAL CARE Performed by: Eula Listen   Total critical care time: 35 minutes  Critical care time was exclusive of separately billable procedures and treating other patients.  Critical care was necessary to treat or prevent imminent or life-threatening deterioration.  Critical care was time spent personally by me on the following activities: development of treatment  plan with patient and/or surrogate as well as nursing, discussions with consultants, evaluation of patient's response to treatment, examination of patient, obtaining history from patient or surrogate, ordering and performing treatments and interventions, ordering and review of laboratory studies, ordering and review of radiographic studies, pulse oximetry and re-evaluation of patient's condition.   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

## 2016-10-06 NOTE — ED Notes (Signed)
Pt passed swallow eval - admitting dr made aware and will put in diet order 2gm ada diet. Spoke with dr. Mariea Clonts - pt's nihss was a 2 on her eval.

## 2016-10-06 NOTE — ED Triage Notes (Signed)
Per family he developed some bilateral leg weakness 2 days   Slurred speech yesterday and today  Also notice high blood pressure this am

## 2016-10-07 ENCOUNTER — Inpatient Hospital Stay: Payer: Medicare Other

## 2016-10-07 ENCOUNTER — Inpatient Hospital Stay (HOSPITAL_COMMUNITY)
Admit: 2016-10-07 | Discharge: 2016-10-07 | Disposition: A | Discharge status: Home / Self Care | Payer: Medicare Other | Attending: Internal Medicine | Admitting: Internal Medicine

## 2016-10-07 DIAGNOSIS — I63531 Cerebral infarction due to unspecified occlusion or stenosis of right posterior cerebral artery: Principal | ICD-10-CM

## 2016-10-07 DIAGNOSIS — I6350 Cerebral infarction due to unspecified occlusion or stenosis of unspecified cerebral artery: Secondary | ICD-10-CM

## 2016-10-07 LAB — CBC
HEMATOCRIT: 33 % — AB (ref 40.0–52.0)
HEMOGLOBIN: 11.1 g/dL — AB (ref 13.0–18.0)
MCH: 31.3 pg (ref 26.0–34.0)
MCHC: 33.7 g/dL (ref 32.0–36.0)
MCV: 92.9 fL (ref 80.0–100.0)
Platelets: 139 10*3/uL — ABNORMAL LOW (ref 150–440)
RBC: 3.56 MIL/uL — ABNORMAL LOW (ref 4.40–5.90)
RDW: 13 % (ref 11.5–14.5)
WBC: 10 10*3/uL (ref 3.8–10.6)

## 2016-10-07 LAB — GLUCOSE, CAPILLARY
GLUCOSE-CAPILLARY: 228 mg/dL — AB (ref 65–99)
GLUCOSE-CAPILLARY: 152 mg/dL — AB (ref 65–99)
GLUCOSE-CAPILLARY: 146 mg/dL — AB (ref 65–99)
Glucose-Capillary: 166 mg/dL — ABNORMAL HIGH (ref 65–99)

## 2016-10-07 LAB — LIPID PANEL
Cholesterol: 109 mg/dL (ref 0–200)
HDL: 31 mg/dL — ABNORMAL LOW (ref >40)
LDL CALC: 40 mg/dL (ref 0–99)
Total CHOL/HDL Ratio: 3.5 RATIO
Triglycerides: 191 mg/dL — ABNORMAL HIGH (ref <150)
VLDL: 38 mg/dL (ref 0–40)

## 2016-10-07 LAB — ECHOCARDIOGRAM COMPLETE
HEIGHTINCHES: 74 in
Weight: 3609.6 oz

## 2016-10-07 LAB — BASIC METABOLIC PANEL
ANION GAP: 8 (ref 5–15)
BUN: 41 mg/dL — AB (ref 6–20)
CALCIUM: 8.5 mg/dL — AB (ref 8.9–10.3)
CO2: 26 mmol/L (ref 22–32)
Chloride: 101 mmol/L (ref 101–111)
Creatinine, Ser: 2.09 mg/dL — ABNORMAL HIGH (ref 0.61–1.24)
GFR calc Af Amer: 34 mL/min — ABNORMAL LOW (ref >60)
GFR, EST NON AFRICAN AMERICAN: 29 mL/min — AB (ref >60)
GLUCOSE: 212 mg/dL — AB (ref 65–99)
Potassium: 3.7 mmol/L (ref 3.5–5.1)
Sodium: 135 mmol/L (ref 135–145)

## 2016-10-07 MED ORDER — ASPIRIN 81 MG PO CHEW
81.0000 mg | CHEWABLE_TABLET | Freq: Every day | ORAL | Status: DC
  Administered 2016-10-08: 11:00:00 81 mg via ORAL
  Filled 2016-10-07: qty 1

## 2016-10-07 MED ORDER — CLOPIDOGREL BISULFATE 75 MG PO TABS
75.0000 mg | ORAL_TABLET | Freq: Every day | ORAL | Status: DC
Start: 2016-10-07 — End: 2016-10-08
  Administered 2016-10-07 – 2016-10-08 (×2): 75 mg via ORAL
  Filled 2016-10-07 (×2): qty 1

## 2016-10-07 NOTE — Progress Notes (Addendum)
[facility] [place] NAME: Joangel Vanosdol    MR#:  [id number]  DATE OF BIRTH:  Feb 06, 1941  SUBJECTIVE:  CHIEF COMPLAINT:   Chief Complaint  Patient presents with  . Hypertension  . Extremity Weakness   Still slurred speech, weakness is better. REVIEW OF SYSTEMS:  Review of Systems  Constitutional: Negative for chills and fever.  HENT: Negative for congestion.   Eyes: Negative for blurred vision and double vision.  Respiratory: Negative for cough, shortness of breath and stridor.   Cardiovascular: Negative for chest pain and leg swelling.  Gastrointestinal: Negative for abdominal pain, blood in stool, constipation, diarrhea, melena, nausea and vomiting.  Genitourinary: Negative for dysuria and hematuria.  Musculoskeletal: Negative for back pain.  Neurological: Positive for speech change and weakness. Negative for dizziness, tingling, tremors, sensory change, focal weakness, seizures, loss of consciousness and headaches.  Psychiatric/Behavioral: Negative for depression. The patient is not nervous/anxious.     DRUG ALLERGIES:   Allergies  Allergen Reactions  . Toujeo Solostar [Insulin Glargine] Other (See Comments)    Reaction : Numbness in arm, tingling   VITALS:  Blood pressure (!) 155/76, pulse 68, temperature 98.7 F (37.1 C), temperature source Oral, resp. rate 18, height 6\' 2"  (1.88 m), weight 225 lb 9.6 oz (102.3 kg), SpO2 100 %. PHYSICAL EXAMINATION:  Physical Exam  Constitutional: He is oriented to person, place, and time and well-developed, well-nourished, and in no distress.  HENT:  Head: Normocephalic.  Mouth/Throat: Oropharynx is clear and moist.  Eyes: Conjunctivae and EOM are normal.  Neck: Normal range of motion. Neck supple. No JVD present. No tracheal deviation present.  Cardiovascular: Normal rate, regular rhythm and normal heart sounds.  Exam reveals no gallop.   No murmur heard. Pulmonary/Chest: Effort  normal and breath sounds normal. No respiratory distress. He has no wheezes. He has no rales.  Abdominal: Soft. Bowel sounds are normal. He exhibits no distension. There is no tenderness.  Musculoskeletal: Normal range of motion. He exhibits no edema or tenderness.  Neurological: He is alert and oriented to person, place, and time.  Dysarthria  Skin: No rash noted. No erythema.  Psychiatric: Affect normal.   LABORATORY PANEL:  Male CBC  Recent Labs Lab 10/07/16 0614  WBC 10.0  HGB 11.1*  HCT 33.0*  PLT 139*   ------------------------------------------------------------------------------------------------------------------ Chemistries   Recent Labs Lab 10/06/16 1407 10/07/16 0614  NA 134* 135  K 4.2 3.7  CL 101 101  CO2 24 26  GLUCOSE 267* 212*  BUN 45* 41*  CREATININE 2.47* 2.09*  CALCIUM 9.0 8.5*  AST 20  --   ALT 19  --   ALKPHOS 53  --   BILITOT 1.4*  --    RADIOLOGY:  Dg Chest 2 View  Result Date: 10/06/2016 CLINICAL DATA:  Bilateral leg weakness for 2 days, slurred speech. Hypertensive. History of cancer and hypertension. EXAM: CHEST  2 VIEW COMPARISON:  Chest radiograph May 30, 2015 FINDINGS: Cardiac silhouette is upper limits of normal in size. Mediastinal silhouette is nonsuspicious. Mildly calcified aortic knob. Mildly elevated RIGHT hemidiaphragm is unchanged. No pleural effusion or focal consolidation. No pneumothorax. Mild degenerative change of the thoracic spine. IMPRESSION: Stable examination: Borderline cardiomegaly without acute pulmonary process. Electronically Signed   By: Elon Alas M.D.   On: 10/06/2016 18:19   Mr Brain Wo Contrast  Result Date: 10/07/2016 CLINICAL DATA:  Stroke.  Hypertension and diabetes EXAM: MRI HEAD WITHOUT CONTRAST MRA HEAD  WITHOUT CONTRAST TECHNIQUE: Multiplanar, multiecho pulse sequences of the brain and surrounding structures were obtained without intravenous contrast. Angiographic images of the head were obtained  using MRA technique without contrast. COMPARISON:  CT head of 10/06/2016 FINDINGS: MRI HEAD FINDINGS Brain: Acute infarct in the right posterior cerebral artery territory. This involves the posteromedial temporal lobe and right occipital lobe. Small acute infarcts also in the right thalamus. Chronic microvascular ischemic change in the white matter. Negative for hydrocephalus. Cerebral volume normal for age. Negative for hemorrhage or mass. Vascular: Normal arterial flow voids. Skull and upper cervical spine: Negative Sinuses/Orbits: Negative Other: None MRA HEAD FINDINGS Both vertebral arteries patent to the basilar. Basilar widely patent. Left PICA patent. Right PICA not visualized however there is a large right AICA which likely supplies this territory. Occlusion of the proximal right posterior cerebral artery compatible with acute infarction. Moderate stenosis of the left posterior cerebral artery in the midportion. Atherosclerotic disease in the precavernous and cavernous right carotid. Critical stenosis of the terminal right internal carotid artery extending into the right M1 segment which is severely narrowed. Right A1 segment is occluded. Severe stenosis of the right MCA bifurcation. Left internal carotid artery patent. Left posterior communicating artery patent. Left anterior and middle cerebral arteries are patent. Right anterior cerebral artery supplied from the left. IMPRESSION: Acute infarct right PCA territory. The right posterior cerebral artery is occluded proximally. 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 The right A1 segment is occluded and right M1 segment severely stenotic. Severe stenosis right MCA bifurcation. Electronically Signed   By: Franchot Gallo M.D.   On: 10/07/2016 10:26   US Carotid Bilateral (at [facility] Only)  Result Date: 10/07/2016 CLINICAL DATA:  Slurred speech, hypertension, diabetes, former smoker EXAM:  BILATERAL CAROTID DUPLEX ULTRASOUND TECHNIQUE: Pearline Cables scale imaging, color Doppler and duplex ultrasound were performed of bilateral carotid and vertebral arteries in the neck. COMPARISON:  10/07/2016 MRI FINDINGS: Criteria: Quantification of carotid stenosis is based on velocity parameters that correlate the residual internal carotid diameter with NASCET-based stenosis levels, using the diameter of the distal internal carotid lumen as the denominator for stenosis measurement. The following velocity measurements were obtained: RIGHT ICA:  89/13 cm/sec CCA:  86/76 cm/sec SYSTOLIC ICA/CCA RATIO:  0.9 DIASTOLIC ICA/CCA RATIO:  1.0 ECA:  82 cm/sec LEFT ICA:  127/23 cm/sec CCA:  195/09 cm/sec SYSTOLIC ICA/CCA RATIO:  1.2 DIASTOLIC ICA/CCA RATIO:  1.3 ECA:  83 cm/sec RIGHT CAROTID ARTERY: Heterogeneous moderate right carotid bifurcation atherosclerosis with areas of calcification. Despite this, there is no hemodynamically significant right ICA stenosis, velocity elevation, or turbulent flow. RIGHT VERTEBRAL ARTERY:  Antegrade LEFT CAROTID ARTERY: Similar heterogeneous moderate left carotid bifurcation atherosclerosis. Despite this, no hemodynamically significant left ICA stenosis, velocity elevation, or turbulent flow. Degree of narrowing also less than 50%. LEFT VERTEBRAL ARTERY:  Antegrade IMPRESSION: Moderate bilateral carotid atherosclerosis. No hemodynamically significant ICA stenosis. Degree of narrowing less than 50% bilaterally. Patent antegrade vertebral flow bilaterally Electronically Signed   By: Jerilynn Mages.  Shick M.D.   On: 10/07/2016 11:49   Mr Jodene Nam Head/brain TO Cm  Result Date: 10/07/2016 CLINICAL DATA:  Stroke.  Hypertension and diabetes EXAM: MRI HEAD WITHOUT CONTRAST MRA HEAD WITHOUT CONTRAST TECHNIQUE: Multiplanar, multiecho pulse sequences of the brain and surrounding structures were obtained without intravenous contrast. Angiographic images of the head were obtained using MRA technique without contrast.  COMPARISON:  CT head of 10/06/2016 FINDINGS: MRI HEAD FINDINGS Brain: Acute infarct in  the right posterior cerebral artery territory. This involves the posteromedial temporal lobe and right occipital lobe. Small acute infarcts also in the right thalamus. Chronic microvascular ischemic change in the white matter. Negative for hydrocephalus. Cerebral volume normal for age. Negative for hemorrhage or mass. Vascular: Normal arterial flow voids. Skull and upper cervical spine: Negative Sinuses/Orbits: Negative Other: None MRA HEAD FINDINGS Both vertebral arteries patent to the basilar. Basilar widely patent. Left PICA patent. Right PICA not visualized however there is a large right AICA which likely supplies this territory. Occlusion of the proximal right posterior cerebral artery compatible with acute infarction. Moderate stenosis of the left posterior cerebral artery in the midportion. Atherosclerotic disease in the precavernous and cavernous right carotid. Critical stenosis of the terminal right internal carotid artery extending into the right M1 segment which is severely narrowed. Right A1 segment is occluded. Severe stenosis of the right MCA bifurcation. Left internal carotid artery patent. Left posterior communicating artery patent. Left anterior and middle cerebral arteries are patent. Right anterior cerebral artery supplied from the left. IMPRESSION: Acute infarct right PCA territory. The right posterior cerebral artery is occluded proximally. 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 The right A1 segment is occluded and right M1 segment severely stenotic. Severe stenosis right MCA bifurcation. Electronically Signed   By: Franchot Gallo M.D.   On: 10/07/2016 10:26   ASSESSMENT AND PLAN:   Acute CVA with generalized weakness, slurred speech and ambulatory difficulty MRI of the brain showed acute right PCA infarct, MRA shows an occluded right PCA.  Carotid duplex didn't show any significant stenosis. Continue aspirin 81 mg by mouth daily, add Plavix per Dr. Doy Mince, continue Crestor. Neuro check, follow-up PT, speech study, echocardiogram. OT: SNF.  ARF on chronic kidney disease stage III with dehydration  Continue IV fluid to support and follow-up BMP. hold the Lasix, lisinopril.   Malignant hypertension:  The patient was started Norvasc, Coreg 25 mg by mouth twice a day, added hydralazine 25 mg every 8 hours, hold lisinopril, furosemide because of renal insufficiency.  Diabetes type II:  On decrease thed Levemir dose 15 units bid and sliding scale.  Discussed with Dr. Doy Mince. All the records are reviewed and case discussed with Care Management/Social Worker. Management plans discussed with the patient, his wife and daughter and they are in agreement.  CODE STATUS: Full Code  TOTAL TIME TAKING CARE OF THIS PATIENT: 39 minutes.   More than 50% of the time was spent in counseling/coordination of care: YES  POSSIBLE D/C IN 2 DAYS, DEPENDING ON CLINICAL CONDITION.   Demetrios Loll M.D on 10/07/2016 at 3:35 PM  Between 7am to 6pm - Pager - [phone]  After 6pm go to [email or web address] - Proofreader  [facility]  Office  [phone]  CC: Primary care physician; Coral Spikes, DO  Note: This dictation was prepared with Dragon dictation along with smaller phrase technology. Any transcriptional errors that result from this process are unintentional.

## 2016-10-07 NOTE — Evaluation (Signed)
Occupational Therapy Evaluation Patient Details Name: Jacob Buck MRN: [id number] DOB: Jul 30, 1940 Today's Date: 10/07/2016    History of Present Illness Jacob Buck  is a 76 y.o. male with a known history of Essential hypertension, chronic systolic heart failure, diabetes mellitus type 2 , history of previous stroke comes in with generalized weakness, patient could not get up this morning. Concerning generalized weakness, ambulates with difficulty family brought him here.Patient not eating well for the past 2-3 days. Also noticed speech troubles for the last 2-3 days. Pressure at home this morning 203/123. Send usually is able to use a walker but today morning and yesterday  family had to use wheelchair. Hx of prior CVAs, R kidney and colon cancer, arthritis (esp R hand), Type 2 diabetes mellitus, HTN, cardiomyopathy, CKD stage 3, diverticulosis    Clinical Impression   Patient is 76 y.o. male who was admitted for right sided weakness and difficulty ambulating. He has a hx of previous strokes and several other diagnoses as indicated above. Pt. presents with pain in R hand from arthritis and increased tone, decreased strength, decreased functional hand use, impulsivity and decreased functional mobility which hinder his ability to complete ADL and IADL tasks. His wife is present and sitting in a wc due to a broken ankle.  One of his two daughters live with him and his wife and is not able to work due to seizure disorder.  She has been helping him for last several weeks. Patient could benefit from skilled OT services to work on RUE neuro muscular re-ed, therapeutic exercises, positioning, and pt./family education.  Patient and family education was provided about stroke recovery, RUE ROM and positioning. Spoke to PT about considering use of a platform walker since he has moderate pain in R hand when squeezing due to arthritis.  Pt would benefit from continued therapy after discharge at SNF before  returning home, but pt is insistent on going home with [facility]. Spoke to [facility] from SW to discuss discharge planning.  Concerned about patient going home with enough help at home since his wife will not be able to care for him due to a broken ankle if anything happens to their daughter's medical status.    Follow Up Recommendations  SNF    Equipment Recommendations  Tub/shower seat    Recommendations for Other Services       Precautions / Restrictions Precautions Precautions: Fall Restrictions Weight Bearing Restrictions: No      Mobility Bed Mobility                  Transfers                      Balance                                           ADL either performed or assessed with clinical judgement   ADL Overall ADL's : Needs assistance/impaired Eating/Feeding: Set up;Minimal assistance;Cueing for safety Eating/Feeding Details (indicate cue type and reason): set up for cutting meat and one handed tech, cues for slowing down and chewing food,  Grooming: Wash/dry hands;Oral care;Wash/dry face;Set up;Minimal assistance Grooming Details (indicate cue type and reason): to squeeze toothpaste and direct on toothbrush         Upper Body Dressing : Moderate assistance;Set up   Lower Body Dressing: Maximal assistance;Set up Lower Body  Dressing Details (indicate cue type and reason): decreased balance, pain in R hand, impulsive                     Vision Patient Visual Report: No change from baseline       Perception     Praxis      Pertinent Vitals/Pain Pain Assessment: No/denies pain (pain in R hand when pushing or squeezing his hand)     Hand Dominance Left   Extremity/Trunk Assessment Upper Extremity Assessment Upper Extremity Assessment: RUE deficits/detail RUE Deficits / Details: Full AROM R hand and elbow but painful when holding or squeezing items; shoulder flexion to 100 degrees with stiffness R >L RUE Sensation:  decreased proprioception RUE Coordination: decreased fine motor;decreased gross motor   Lower Extremity Assessment Lower Extremity Assessment: Defer to PT evaluation       Communication Communication Communication: No difficulties;Other (comment) (slurred speech at times)   Cognition Arousal/Alertness: Awake/alert Behavior During Therapy: [facility] for tasks assessed/performed;Flat affect;Impulsive Overall Cognitive Status: History of cognitive impairments - at baseline                                     General Comments       Exercises     Shoulder Instructions      Home Living Family/patient expects to be discharged to:: Private residence Living Arrangements: Spouse/significant other;Children Available Help at Discharge: Family Type of Home: House Home Access: Ramped entrance     Home Layout: One level     Bathroom Shower/Tub: Tub/shower unit;Walk-in shower (pt has both but used shower stall in past but now cannot stand to shower and possibly too small to fit a chair)   Bathroom Toilet: Standard Bathroom Accessibility: Yes (uses BSC over toilet)   Home Equipment: Walker - 2 wheels;Wheelchair - manual;Bedside commode   Additional Comments: Pt was using a FWW up until last few days and was using wc due to inability to stand and walk      Prior Functioning/Environment Level of Independence: Needs assistance    ADL's / Homemaking Assistance Needed: Pt was able to feed self after set up using L hand only with freq cues to slow down; assist needed for bathing and dressing and was able to assist with grooming after set up.  Total assist for home making skills--wife or daughter assisted.    Comments: Pt and family report undiagnosed bipolar and claustrophobia.  Painful R hand due to arthritis.        OT Problem List: Decreased strength;Decreased range of motion;Decreased activity tolerance;Impaired balance (sitting and/or standing);Decreased  cognition;Decreased safety awareness;Impaired tone;Impaired UE functional use;Pain      OT Treatment/Interventions: Self-care/ADL training;Neuromuscular education;Therapeutic activities;Balance training;Patient/family education;Therapeutic exercise    OT Goals(Current goals can be found in the care plan section) Acute Rehab OT Goals Patient Stated Goal: "to go home and get therapy there.  I am claustrophobic and even being in this room is hard to take" OT Goal Formulation: With patient/family Time For Goal Achievement: 10/21/16 Potential to Achieve Goals: Good ADL Goals Pt Will Perform Eating: with set-up;with min guard assist;with adaptive utensils;sitting (cues to slow down and for checking for pocketing of food) Pt Will Perform Grooming: with set-up;with min guard assist;sitting (using R hand with L ) Pt Will Perform Upper Body Dressing: with min assist;sitting (dressing R side first) Pt Will Perform Lower Body Dressing: with mod assist;sit to/from  stand (with FWW for standing and no LOB) Pt Will Transfer to Toilet: with mod assist;stand pivot transfer;bedside commode (BSC over toilet like at home) Pt/caregiver will Perform Home Exercise Program: Right Upper extremity;Increased ROM;Increased strength;With written HEP provided;With Supervision  OT Frequency: Min 1X/week   Barriers to D/C:    wife has a broken ankle and daughter who lives with them has hx of seizures and bipolar       Co-evaluation              End of Session Nurse Communication:  (Updated PT and SW about family wanting HH only and pt and duaghter who lives with them are bipolar and pt is claustrophobic and may need a platform walker due to pain in R hand)  Activity Tolerance: Patient tolerated treatment well Patient left: in bed;with call bell/phone within reach;with bed alarm set;with family/visitor present  OT Visit Diagnosis: Muscle weakness (generalized) (M62.81);Unsteadiness on feet (R26.81);Pain Pain -  Right/Left: Right Pain - part of body: Hand                Time: 1120-1200 OT Time Calculation (min): 40 min Charges:  OT General Charges $OT Visit: 1 Procedure OT Evaluation $OT Eval Moderate Complexity: 1 Procedure OT Treatments $Self Care/Home Management : 23-37 mins G-Codes:     Chrys Racer, OTR/L ascom [phone] 10/07/16, 1:04 PM

## 2016-10-07 NOTE — Progress Notes (Signed)
SLP Cancellation Note  Patient Details Name: Jacob Buck MRN: [id number] DOB: 07-26-1940   Cancelled treatment:       Reason Eval/Treat Not Completed: Patient at procedure or test/unavailable (chart reviewed; consulted NSG/family members present). Pt has been out of the room much of the morning for tests; currently having a test in room. Family members present.  Discussed w/ pt's family members present(wife and dtr) pt's baseline status as pt has had a previous CVA w/ resulting weakness and Dysarthria. Wife reported she had to ask him to repeat himself "often" to make himself understood. Family members indicated he also "needed to remember to slow down or he would get choked eating so fast". ST services will f/u w/ the Cognitive-Linguistic assessment next 1-3 days. Will also monitor toleration of diet for need for BSE if change of status from baseline. Family agreed. NSG updated.   Orinda Kenner, MS, CCC-SLP Watson,Katherine 10/07/2016, 1:21 PM

## 2016-10-07 NOTE — Care Management (Signed)
Admitted to this facility with the diagnosis of weakness/CVA. Lives with wife, Tamela Oddi [phone]). Last seen Dr, Lacinda Axon 12/2015. Rolling walker, wheelchair, bedside commode, and ramps in the home. [facility] 05/2015. No skilled nursing facility.  No home oxygen. Prescriptions are filled at [facility]. Last fall was in 2017. Appetite is usually good until this week. Self feed, self dress, needs help with baths. Doesn't drive, daughter helps with errands.  Discussed with wife that insurance would not pay for a nursing assistant in the home unless physical therapy is in the home. Occupational therapy evaluation completed. Physical therapy evaluation pending. Will need speech therapy  where ever he goes.  Shelbie Ammons RN MSN CCM Care Management [phone]

## 2016-10-07 NOTE — Progress Notes (Signed)
Inpatient Diabetes Program Recommendations  AACE/ADA: New Consensus Statement on Inpatient Glycemic Control (2015)  Target Ranges:  Prepandial:   less than 140 mg/dL      Peak postprandial:   less than 180 mg/dL (1-2 hours)      Critically ill patients:  140 - 180 mg/dL   Lab Results  Component Value Date   GLUCAP 166 (H) 10/07/2016   HGBA1C 7.5 (H) 12/29/2015    Review of Glycemic Control  Results for Jacob Buck, Jacob Buck (MRN [id number]) as of 10/07/2016 11:18  Ref. Range 04/09/2016 16:44 10/06/2016 21:59 10/07/2016 07:30 10/07/2016 11:03  Glucose-Capillary Latest Ref Range: 65 - 99 mg/dL 178 (H) 168 (H) 228 (H) 166 (H)    Diabetes history: Type 2 Outpatient Diabetes medications: Levemir 22units bid (confirmed by wife and patient)  Current orders for Inpatient glycemic control: Levemir 15 units bid, Novolog 0-9 units tid  Inpatient Diabetes Program Recommendations:   Agree with current medications for blood sugar management.    Gentry Fitz, RN, BA, MHA, CDE Diabetes Coordinator Inpatient Diabetes Program  [phone] (Team Pager) [phone] (North) 10/07/2016 2:15 PM

## 2016-10-07 NOTE — Consult Note (Signed)
Referring Physician: Bridgett Larsson    Chief Complaint: Weakness  HPI: Jacob Buck is an 76 y.o. male with a history of previous stroke who comes in with generalized weakness.  Patient could not get up yesterday morning. Patient not eating well for the past 2-3 days. Also noticed speech troubles for the last 2-3 days. Has had difficulty seeing his medications to take.  BP yesterday morning 203/123.   At baseline able to use a walker but yesterday  family had to use wheelchair.  Initial NIHSS of 3.    Date last known well: Unable to determine Time last known well: Unable to determine tPA Given: No: Unable to determine LKW  Past Medical History:  Diagnosis Date  . Arthritis 2008  . Cancer [facility]) 1991   Colon; right kidney ?  . Cardiomyopathy ([place])    a. ? isch vs nicm;  b. 05/2015 Echo: EF 35-40%, mild MR, mildly dil LA, poor acoustic windows.  . CKD (chronic kidney disease), stage III   . Diverticulosis   . DM type 2 (diabetes mellitus, type 2) ([place]) 2008  . Hyperlipidemia   . Hypertension   . Kidney stone   . Stroke [facility])    a. 05/2015 MRI: acute to early subacute R thalamic infarct, chronic L thalamic infarct, late subacute to chronic right occipital infarct;  b. 05/2015 Carotid U/S: RICA 3-29%J, LICA 1-88%C, patent vertebrals;  b. 06/2015 Event Monitor: RSR, rare PVC's.    Past Surgical History:  Procedure Laterality Date  . COLON SURGERY  2008   carcinoid  . COLONOSCOPY  2003, 2014  . CYSTOSCOPY WITH DIRECT VISION INTERNAL URETHROTOMY N/A 04/28/2015   Procedure: CYSTOSCOPY WITH DIRECT VISION INTERNAL URETHROTOMY WITH HOLMIUM LASER ;  Surgeon: Royston Cowper, MD;  Location: [facility] ORS;  Service: Urology;  Laterality: N/A;  . FOOT SURGERY Right 2013    Family History  Problem Relation Age of Onset  . Heart disease Father     mild MI  . Diabetes Sister   . Alzheimer's disease Sister   . Diabetes Brother   . Alzheimer's disease Brother    Social History:  reports that he has quit  smoking. He has never used smokeless tobacco. He reports that he does not drink alcohol or use drugs.  Allergies:  Allergies  Allergen Reactions  . Toujeo Solostar [Insulin Glargine] Other (See Comments)    Reaction : Numbness in arm, tingling    Medications:  I have reviewed the patient's current medications. Prior to Admission:  Prescriptions Prior to Admission  Medication Sig Dispense Refill Last Dose  . aspirin 325 MG tablet Take 1 tablet (325 mg total) by mouth daily. 30 tablet 5 10/06/2016 at 1200  . carvedilol (COREG) 25 MG tablet Take 1 tablet (25 mg total) by mouth 2 (two) times daily with a meal. 180 tablet 3 10/06/2016 at 1200  . docusate sodium (COLACE) 100 MG capsule Take 2 capsules (200 mg total) by mouth 2 (two) times daily. (Patient taking differently: Take 200 mg by mouth as needed. ) 120 capsule 3 prn at prn  . Insulin Detemir (LEVEMIR FLEXTOUCH) 100 UNIT/ML Pen Inject 22 Units into the skin 2 (two) times daily. 15 mL 0 10/06/2016 at am  . lisinopril (PRINIVIL,ZESTRIL) 40 MG tablet Take 1 tablet (40 mg total) by mouth daily. 30 tablet 3 10/06/2016 at 1200  . rosuvastatin (CRESTOR) 20 MG tablet Take 1 tablet (20 mg total) by mouth daily. 90 tablet 3 10/06/2016 at 1200  .  furosemide (LASIX) 20 MG tablet Take 20 tablets by mouth as needed.   Not Taking at Unknown time  . Insulin Pen Needle (BD PEN NEEDLE NANO U/F) 32G X 4 MM MISC Use for insulin administration three times daily 100 each 11 Taking  . LEVEMIR FLEXTOUCH 100 UNIT/ML Pen INJECT 42 UNITS SUBCUTANEOUSLY AT BEDTIME (Patient not taking: Reported on 10/06/2016) 15 mL 5 Not Taking at Unknown time  . tamsulosin (FLOMAX) 0.4 MG CAPS capsule TAKE ONE CAPSULE BY MOUTH ONCE DAILY (Patient not taking: Reported on 10/06/2016) 30 capsule 11 Not Taking at Unknown time   Scheduled: . amLODipine  5 mg Oral Daily  . aspirin  325 mg Oral Daily  . carvedilol  25 mg Oral BID WC  . docusate sodium  200 mg Oral BID  . enoxaparin (LOVENOX)  injection  40 mg Subcutaneous Q24H  . hydrALAZINE  25 mg Oral Q8H  . insulin aspart  0-9 Units Subcutaneous TID WC  . insulin detemir  15 Units Subcutaneous BID  . rosuvastatin  20 mg Oral Daily    ROS: History obtained from the patient  General ROS: negative for - chills, fatigue, fever, night sweats, weight gain or weight loss Psychological ROS: negative for - behavioral disorder, hallucinations, memory difficulties, mood swings or suicidal ideation Ophthalmic ROS:  blurry vision ENT ROS: negative for - epistaxis, nasal discharge, oral lesions, sore throat, tinnitus or vertigo Allergy and Immunology ROS: negative for - hives or itchy/watery eyes Hematological and Lymphatic ROS: negative for - bleeding problems, bruising or swollen lymph nodes Endocrine ROS: negative for - galactorrhea, hair pattern changes, polydipsia/polyuria or temperature intolerance Respiratory ROS: negative for - cough, hemoptysis, shortness of breath or wheezing Cardiovascular ROS: negative for - chest pain, dyspnea on exertion, edema or irregular heartbeat Gastrointestinal ROS: negative for - abdominal pain, diarrhea, hematemesis, nausea/vomiting or stool incontinence Genito-Urinary ROS: negative for - dysuria, hematuria, incontinence or urinary frequency/urgency Musculoskeletal ROS: negative for - joint swelling or muscular weakness Neurological ROS: as noted in HPI, baseline right sided weakness Dermatological ROS: negative for rash and skin lesion changes  Physical Examination: Blood pressure (!) 167/75, pulse 69, temperature 98.4 F (36.9 C), temperature source Oral, resp. rate 18, height 6\' 2"  (1.88 m), weight 102.3 kg (225 lb 9.6 oz), SpO2 99 %.  HEENT-  Normocephalic, no lesions, without obvious abnormality.  Normal external eye and conjunctiva.  Normal TM's bilaterally.  Normal auditory canals and external ears. Normal external nose, mucus membranes and septum.  Normal pharynx. Cardiovascular- S1, S2  normal, pulses palpable throughout   Lungs- chest clear, no wheezing, rales, normal symmetric air entry Abdomen- soft, non-tender; bowel sounds normal; no masses,  no organomegaly Extremities- no edema Lymph-no adenopathy palpable Musculoskeletal-no joint tenderness, deformity or swelling Skin-warm and dry, no hyperpigmentation, vitiligo, or suspicious lesions  Neurological Examination   Mental Status: Alert, oriented, thought content appropriate.  Speech fluent without evidence of aphasia but slurred.  Able to follow 3 step commands without difficulty. Cranial Nerves: II: Discs flat bilaterally; LHH III,IV, VI: ptosis not present, extra-ocular motions intact bilaterally V,VII: right facial droop, facial light touch sensation normal bilaterally VIII: hearing normal bilaterally IX,X: gag reflex present XI: bilateral shoulder shrug XII: midline tongue extension Motor: Right : Upper extremity   5-/5      Left:     Upper extremity   5/5  Lower extremity   4+/5 with foot drop     Lower extremity   5/5 with foot drop Significant  atrophy in the hand intrinsics bilaterally.   Sensory: Pinprick and light touch intact throughout, bilaterally Deep Tendon Reflexes: 1+ in the upper extremities and absent in the lower extremities Plantars: Right: mute   Left: mute Cerebellar: Normal finger-to-nose with tremor on right.  Normal heel-to-shin testing Gait: not tested due to safety concerns   Laboratory Studies:  Basic Metabolic Panel:  Recent Labs Lab 10/06/16 1407 10/07/16 0614  NA 134* 135  K 4.2 3.7  CL 101 101  CO2 24 26  GLUCOSE 267* 212*  BUN 45* 41*  CREATININE 2.47* 2.09*  CALCIUM 9.0 8.5*    Liver Function Tests:  Recent Labs Lab 10/06/16 1407  AST 20  ALT 19  ALKPHOS 53  BILITOT 1.4*  PROT 7.8  ALBUMIN 3.9   No results for input(s): LIPASE, AMYLASE in the last 168 hours. No results for input(s): AMMONIA in the last 168 hours.  CBC:  Recent Labs Lab  10/06/16 1407 10/07/16 0614  WBC 10.7* 10.0  NEUTROABS 8.5*  --   HGB 12.2* 11.1*  HCT 36.3* 33.0*  MCV 90.8 92.9  PLT 159 139*    Cardiac Enzymes:  Recent Labs Lab 10/06/16 1407  TROPONINI 0.03*    BNP: Invalid input(s): POCBNP  CBG:  Recent Labs Lab 10/06/16 2159 10/07/16 0730 10/07/16 1103  GLUCAP 168* 228* 166*    Microbiology: Results for orders placed or performed during the hospital encounter of 03/12/15  Urine culture     Status: None   Collection Time: 03/12/15  7:39 AM  Result Value Ref Range Status   Specimen Description URINE, RANDOM  Final   Special Requests NONE  Final   Culture >=100,000 COLONIES/mL ENTEROCOCCUS FAECALIS  Final   Report Status 03/17/2015 FINAL  Final   Organism ID, Bacteria ENTEROCOCCUS FAECALIS  Final      Susceptibility   Enterococcus faecalis - MIC*    AMPICILLIN <=2 SENSITIVE Sensitive     LINEZOLID 2 SENSITIVE Sensitive     CIPROFLOXACIN Value in next row Sensitive      SENSITIVE<=0.5    LEVOFLOXACIN Value in next row Sensitive      SENSITIVE0.5    NITROFURANTOIN Value in next row Sensitive      SENSITIVE<=16    TETRACYCLINE Value in next row Intermediate      INTERMEDIATE8    * >=100,000 COLONIES/mL ENTEROCOCCUS FAECALIS    Coagulation Studies:  Recent Labs  10/06/16 1407  LABPROT 13.0  INR 0.98    Urinalysis:  Recent Labs Lab 10/06/16 1637  COLORURINE YELLOW*  LABSPEC 1.020  PHURINE 5.0  GLUCOSEU 150*  HGBUR SMALL*  BILIRUBINUR NEGATIVE  KETONESUR NEGATIVE  PROTEINUR 100*  NITRITE NEGATIVE  LEUKOCYTESUR NEGATIVE    Lipid Panel:    Component Value Date/Time   CHOL 109 10/07/2016 0614   CHOL 122 02/14/2012 0221   TRIG 191 (H) 10/07/2016 0614   TRIG 190 02/14/2012 0221   HDL 31 (L) 10/07/2016 0614   HDL 13 (L) 02/14/2012 0221   CHOLHDL 3.5 10/07/2016 0614   VLDL 38 10/07/2016 0614   VLDL 38 02/14/2012 0221   LDLCALC 40 10/07/2016 0614   LDLCALC 71 02/14/2012 0221    HgbA1C:  Lab  Results  Component Value Date   HGBA1C 7.5 (H) 12/29/2015    Urine Drug Screen:  No results found for: LABOPIA, COCAINSCRNUR, LABBENZ, AMPHETMU, THCU, LABBARB  Alcohol Level: No results for input(s): ETH in the last 168 hours.  Other results: EKG: normal sinus rhythm at  71 bpm.  Imaging: Dg Chest 2 View  Result Date: 10/06/2016 CLINICAL DATA:  Bilateral leg weakness for 2 days, slurred speech. Hypertensive. History of cancer and hypertension. EXAM: CHEST  2 VIEW COMPARISON:  Chest radiograph May 30, 2015 FINDINGS: Cardiac silhouette is upper limits of normal in size. Mediastinal silhouette is nonsuspicious. Mildly calcified aortic knob. Mildly elevated RIGHT hemidiaphragm is unchanged. No pleural effusion or focal consolidation. No pneumothorax. Mild degenerative change of the thoracic spine. IMPRESSION: Stable examination: Borderline cardiomegaly without acute pulmonary process. Electronically Signed   By: Elon Alas M.D.   On: 10/06/2016 18:19   Ct Head Wo Contrast  Result Date: 10/06/2016 CLINICAL DATA:  Bilateral leg weakness and slurred speech for 2 days. EXAM: CT HEAD WITHOUT CONTRAST TECHNIQUE: Contiguous axial images were obtained from the base of the skull through the vertex without intravenous contrast. COMPARISON:  Brain MRI 05/30/2015.  Head CT 05/30/2015. FINDINGS: Brain: Low-attenuation in the medial right temporal lobe, parietal lobe and occipital lobe is compatible with acute to subacute ischemia in a right PCA territory, superimposed on posterior chronic PCA infarct seen on previous imaging. No evidence for acute hemorrhage. No mass-effect or mass lesion. No abnormal extra-axial fluid collection. Diffuse loss of parenchymal volume is consistent with atrophy. Patchy low attenuation in the deep hemispheric and periventricular white matter is nonspecific, but likely reflects chronic microvascular ischemic demyelination. Vascular: Atherosclerotic calcification is visualized  in the intracranial segments of the internal carotid arteries. No dense MCA sign. Major dural sinuses are unremarkable. Skull: No evidence for fracture. No worrisome lytic or sclerotic lesion. Sinuses/Orbits: The visualized paranasal sinuses and mastoid air cells are clear. Visualized portions of the globes and intraorbital fat are unremarkable. Other: None. IMPRESSION: Acute to subacute infarct involving the right PCA distribution in this patient with history of previous PCA territory infarct. No evidence for substantial mass-effect or acute hemorrhage. Electronically Signed   By: Misty Stanley M.D.   On: 10/06/2016 14:53   Mr Brain Wo Contrast  Result Date: 10/07/2016 CLINICAL DATA:  Stroke.  Hypertension and diabetes EXAM: MRI HEAD WITHOUT CONTRAST MRA HEAD WITHOUT CONTRAST TECHNIQUE: Multiplanar, multiecho pulse sequences of the brain and surrounding structures were obtained without intravenous contrast. Angiographic images of the head were obtained using MRA technique without contrast. COMPARISON:  CT head of 10/06/2016 FINDINGS: MRI HEAD FINDINGS Brain: Acute infarct in the right posterior cerebral artery territory. This involves the posteromedial temporal lobe and right occipital lobe. Small acute infarcts also in the right thalamus. Chronic microvascular ischemic change in the white matter. Negative for hydrocephalus. Cerebral volume normal for age. Negative for hemorrhage or mass. Vascular: Normal arterial flow voids. Skull and upper cervical spine: Negative Sinuses/Orbits: Negative Other: None MRA HEAD FINDINGS Both vertebral arteries patent to the basilar. Basilar widely patent. Left PICA patent. Right PICA not visualized however there is a large right AICA which likely supplies this territory. Occlusion of the proximal right posterior cerebral artery compatible with acute infarction. Moderate stenosis of the left posterior cerebral artery in the midportion. Atherosclerotic disease in the precavernous  and cavernous right carotid. Critical stenosis of the terminal right internal carotid artery extending into the right M1 segment which is severely narrowed. Right A1 segment is occluded. Severe stenosis of the right MCA bifurcation. Left internal carotid artery patent. Left posterior communicating artery patent. Left anterior and middle cerebral arteries are patent. Right anterior cerebral artery supplied from the left. IMPRESSION: Acute infarct right PCA territory. The right posterior cerebral artery is  occluded proximally. 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 The right A1 segment is occluded and right M1 segment severely stenotic. Severe stenosis right MCA bifurcation. Electronically Signed   By: Franchot Gallo M.D.   On: 10/07/2016 10:26   US Carotid Bilateral (at [facility] And [facility] Only)  Result Date: 10/07/2016 CLINICAL DATA:  Slurred speech, hypertension, diabetes, former smoker EXAM: BILATERAL CAROTID DUPLEX ULTRASOUND TECHNIQUE: Pearline Cables scale imaging, color Doppler and duplex ultrasound were performed of bilateral carotid and vertebral arteries in the neck. COMPARISON:  10/07/2016 MRI FINDINGS: Criteria: Quantification of carotid stenosis is based on velocity parameters that correlate the residual internal carotid diameter with NASCET-based stenosis levels, using the diameter of the distal internal carotid lumen as the denominator for stenosis measurement. The following velocity measurements were obtained: RIGHT ICA:  89/13 cm/sec CCA:  23/76 cm/sec SYSTOLIC ICA/CCA RATIO:  0.9 DIASTOLIC ICA/CCA RATIO:  1.0 ECA:  82 cm/sec LEFT ICA:  127/23 cm/sec CCA:  283/15 cm/sec SYSTOLIC ICA/CCA RATIO:  1.2 DIASTOLIC ICA/CCA RATIO:  1.3 ECA:  83 cm/sec RIGHT CAROTID ARTERY: Heterogeneous moderate right carotid bifurcation atherosclerosis with areas of calcification. Despite this, there is no hemodynamically significant right ICA stenosis, velocity elevation, or  turbulent flow. RIGHT VERTEBRAL ARTERY:  Antegrade LEFT CAROTID ARTERY: Similar heterogeneous moderate left carotid bifurcation atherosclerosis. Despite this, no hemodynamically significant left ICA stenosis, velocity elevation, or turbulent flow. Degree of narrowing also less than 50%. LEFT VERTEBRAL ARTERY:  Antegrade IMPRESSION: Moderate bilateral carotid atherosclerosis. No hemodynamically significant ICA stenosis. Degree of narrowing less than 50% bilaterally. Patent antegrade vertebral flow bilaterally Electronically Signed   By: Jerilynn Mages.  Shick M.D.   On: 10/07/2016 11:49   Mr Jodene Nam Head/brain VV Cm  Result Date: 10/07/2016 CLINICAL DATA:  Stroke.  Hypertension and diabetes EXAM: MRI HEAD WITHOUT CONTRAST MRA HEAD WITHOUT CONTRAST TECHNIQUE: Multiplanar, multiecho pulse sequences of the brain and surrounding structures were obtained without intravenous contrast. Angiographic images of the head were obtained using MRA technique without contrast. COMPARISON:  CT head of 10/06/2016 FINDINGS: MRI HEAD FINDINGS Brain: Acute infarct in the right posterior cerebral artery territory. This involves the posteromedial temporal lobe and right occipital lobe. Small acute infarcts also in the right thalamus. Chronic microvascular ischemic change in the white matter. Negative for hydrocephalus. Cerebral volume normal for age. Negative for hemorrhage or mass. Vascular: Normal arterial flow voids. Skull and upper cervical spine: Negative Sinuses/Orbits: Negative Other: None MRA HEAD FINDINGS Both vertebral arteries patent to the basilar. Basilar widely patent. Left PICA patent. Right PICA not visualized however there is a large right AICA which likely supplies this territory. Occlusion of the proximal right posterior cerebral artery compatible with acute infarction. Moderate stenosis of the left posterior cerebral artery in the midportion. Atherosclerotic disease in the precavernous and cavernous right carotid. Critical stenosis  of the terminal right internal carotid artery extending into the right M1 segment which is severely narrowed. Right A1 segment is occluded. Severe stenosis of the right MCA bifurcation. Left internal carotid artery patent. Left posterior communicating artery patent. Left anterior and middle cerebral arteries are patent. Right anterior cerebral artery supplied from the left. IMPRESSION: Acute infarct right PCA territory. The right posterior cerebral artery is occluded proximally. 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 The right A1 segment is occluded and right M1 segment severely stenotic. Severe stenosis right MCA bifurcation. Electronically Signed   By: Franchot Gallo M.D.   On: 10/07/2016 10:26  Assessment: 76 y.o. male with previous history stroke who presents with weakness and difficulty with vision.  MRI of the brain reviewed and shows an acute right PCA infarct.  Suspect embolic etiology.  MRA shows an occluded right PCA.  Carotid dopplers show no evidence of hemodynamically significant stenosis.  Echocardiogram pending.  A1c pending, LDL 40 on a statin.  Patient compliant with ASA at home.    Stroke Risk Factors - diabetes mellitus, hyperlipidemia and hypertension  Plan: 1. HgbA1c pending 2. PT consult, OT consult, Speech consult 3. Echocardiogram pending 4. Prophylactic therapy-ASA 81 mg and Plavix 75mg  daily 5.. Telemetry monitoring 6. Frequent neuro checks   Case discussed with Dr. Blenda Nicely, MD Neurology [phone] 10/07/2016, 1:42 PM

## 2016-10-07 NOTE — Plan of Care (Deleted)
Problem: Education: Goal: Knowledge of [place] [facility] Education information/materials will improve Outcome: Progressing Tmax 37.8, normothermic for remainder of shift.  Hypertensive during shift, improved with scheduled anti-hypertensives.  VSS otherwise, free of falls during shift.  No complaints overnight, denies pain.  NIH 3, neuro checks stable, except for dorsiflexion and plantar flexion weaker in early AM.  Bed in low position, call bell within reach.  WCTM.

## 2016-10-07 NOTE — Evaluation (Signed)
Physical Therapy Evaluation Patient Details Name: Jacob Buck MRN: [id number] DOB: 12-04-1940 Today's Date: 10/07/2016   History of Present Illness  Jacob Buck  is a 76 y.o. male with a known history of Essential hypertension, chronic systolic heart failure, diabetes mellitus type 2 , history of previous stroke comes in with generalized weakness, patient could not get up this morning. Concerning generalized weakness, ambulates with difficulty family brought him here.Patient not eating well for the past 2-3 days. Also noticed speech troubles for the last 2-3 days. Pressure at home this morning 203/123. Send usually is able to use a walker but today morning and yesterday  family had to use wheelchair. 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 Hx of prior CVAs, R kidney and colon cancer, arthritis (esp R hand), Type 2 diabetes mellitus, HTN, cardiomyopathy, CKD stage 3, diverticulosis     Clinical Impression  Pt awake and alert, wife and daughter were in the room and present for PT evaluation. Pt reports that he lives w/ his wife and daughter at baseline and normally uses a RW to ambulate around his house and was able to get OOB w/ modified independence. Wife and daughter usually assist patient w/ ADLs but wife is wc bound w/ a recent ankle fracture. Per patient and family the patient has some residual deficits from a previous CVA w/ bilat foot drop and blurry vision. He also has diminished sensation in B lower legs and feet that he states is present at baseline.   Pt displayed decreased strength in the LE's grossly 4/5 and required minimal assistance to move to sitting at EOB, he also has difficulty transferring from lower surfaces requiring mod to max assist to stand w/ RW but improved to min assist w/ an elevated bed surface. Pt able to ambulate short distance to nursing station w/ RW and min assist before becoming fatigued and requiring to sit down in wc,  after a brief rest break the patient insisted on ambulating back to his room and was safely able to do so w/ RW and min assist. Overall the patient displays decreased strength and activity tolerance that limit safe functional mobility. He will benefit from skilled PT to correct deficits and return to his PLOF. Recommend pt transition to STR when medically appropriate.     Follow Up Recommendations SNF    Equipment Recommendations  None recommended by PT    Recommendations for Other Services       Precautions / Restrictions Precautions Precautions: Fall Restrictions Weight Bearing Restrictions: No      Mobility  Bed Mobility Overal bed mobility: Needs Assistance Bed Mobility: Supine to Sit     Supine to sit: Min assist;HOB elevated     General bed mobility comments: requires min assist w/ bed mobility to assist w/ scooting LE's off EOB  Transfers Overall transfer level: Needs assistance   Transfers: Sit to/from Stand Sit to Stand: From elevated surface;Min assist         General transfer comment: pt requires elevated bed (to simulate home envrionment), struggles standing from lower surfaces requiring mod assistance   Ambulation/Gait Ambulation/Gait assistance: +2 safety/equipment;Min assist Ambulation Distance (Feet): 40 Feet Assistive device: Rolling walker (2 wheeled) Gait Pattern/deviations: Step-through pattern;Narrow base of support   Gait velocity interpretation: Below normal speed for age/gender General Gait Details: pt ambulates w/ decreased cadence and RW, has narrow BOS and presents w/ bilat foot drop, min assist to safely  guide RW   Stairs            Wheelchair Mobility    Modified Rankin (Stroke Patients Only)       Balance Overall balance assessment: Needs assistance Sitting-balance support: Bilateral upper extremity supported;Feet supported Sitting balance-Leahy Scale: Fair Sitting balance - Comments: pt requires occasional min assist to  maintain sitting posture and tends to lean posteriorly and to the left  Postural control: Posterior lean;Left lateral lean Standing balance support: Bilateral upper extremity supported Standing balance-Leahy Scale: Fair Standing balance comment: requires use of RW for improved stability and has forward head posture in standing                              Pertinent Vitals/Pain Pain Assessment: Faces Faces Pain Scale: Hurts a little bit Pain Location: R wrist and hand due to arthritis  Pain Descriptors / Indicators: Aching Pain Intervention(s): Monitored during session;Limited activity within patient's tolerance    Home Living Family/patient expects to be discharged to:: Private residence Living Arrangements: Spouse/significant other;Children Available Help at Discharge: Family Type of Home: House Home Access: Ramped entrance     Home Layout: One level Home Equipment: Environmental consultant - 2 wheels;Wheelchair - manual;Bedside commode Additional Comments: Pt was using a FWW up until last few days and was using wc due to inability to stand and walk    Prior Function Level of Independence: Needs assistance   Gait / Transfers Assistance Needed: Pt normally able to transfer OOB and use RW to ambulate short household distances up to 66 feet, recently been using a wc due to increased weakness  ADL's / Homemaking Assistance Needed: Pt was able to feed self after set up using L hand only with freq cues to slow down; assist needed for bathing and dressing and was able to assist with grooming after set up.  Total assist for home making skills--wife or daughter assisted.   Comments: pt has residual deficits from previous CVA in 2016 w/ bilat foot drop, he also has arthritis in his hand      Hand Dominance   Dominant Hand: Left    Extremity/Trunk Assessment   Upper Extremity Assessment Upper Extremity Assessment: Defer to OT evaluation;RUE deficits/detail RUE Deficits / Details: displayed  atrophy of R thumb adductor muscles, increased pain in R UE w/ decreased grip strength, some dysmetria w/ finger to nose testing R>L RUE Sensation: decreased proprioception RUE Coordination: decreased fine motor    Lower Extremity Assessment Lower Extremity Assessment: LLE deficits/detail;RLE deficits/detail RLE Deficits / Details: absent light touch sensation in the lower leg and foot, presents w/ foot drop, able to achieve nuetral DF, strength for knee and hip grossly 4/5 RLE Sensation: decreased light touch LLE Deficits / Details: absent light touch sensation in the lower leg and foot, presents w/ foot drop, able to achieve nuetral DF, knee and hip strength grossly 4/5 LLE Sensation: decreased light touch    Cervical / Trunk Assessment Cervical / Trunk Assessment: Kyphotic  Communication   Communication: No difficulties;Other (comment) (sometimes has slurred speech)  Cognition Arousal/Alertness: Awake/alert Behavior During Therapy: WFL for tasks assessed/performed Overall Cognitive Status: Within Functional Limits for tasks assessed                                        General Comments General comments (skin integrity, edema, etc.):  pt has some blurry vision on the R and lacks R convergence with visual tracking, no dizziness     Exercises Other Exercises Other Exercises: transfer training and dressing; 1x4 from bed and wc sit to stand and stand to sit, mod assist from lower surfaces but min assist from higher surfaces, requires some cuing for hand placement and use of RW for improved balance   Assessment/Plan    PT Assessment Patient needs continued PT services  PT Problem List Decreased strength;Decreased range of motion;Decreased activity tolerance;Decreased balance;Decreased mobility;Decreased knowledge of use of DME;Impaired sensation       PT Treatment Interventions DME instruction;Gait training;Functional mobility training;Balance training;Therapeutic  exercise;Therapeutic activities;Patient/family education    PT Goals (Current goals can be found in the Care Plan section)  Acute Rehab PT Goals Patient Stated Goal: To return home  PT Goal Formulation: With patient Time For Goal Achievement: 10/21/16 Potential to Achieve Goals: Good    Frequency 7X/week   Barriers to discharge        Co-evaluation               End of Session Equipment Utilized During Treatment: Gait belt Activity Tolerance: Patient limited by fatigue Patient left: in bed;with call bell/phone within reach;with family/visitor present;with nursing/sitter in room Nurse Communication: Mobility status PT Visit Diagnosis: Unsteadiness on feet (R26.81);Muscle weakness (generalized) (M62.81);Difficulty in walking, not elsewhere classified (R26.2)    Time: 6759-1638 PT Time Calculation (min) (ACUTE ONLY): 34 min   Charges:         PT G Codes:        [employer] Student PT 10/07/16, 4:22 PM [phone]   Saray Capasso 10/07/2016, 4:14 PM

## 2016-10-07 NOTE — Progress Notes (Signed)
*  PRELIMINARY RESULTS* Echocardiogram 2D Echocardiogram has been performed.  Sherrie Sport 10/07/2016, 1:45 PM

## 2016-10-08 LAB — HEMOGLOBIN A1C
Hgb A1c MFr Bld: 7.8 % — ABNORMAL HIGH (ref 4.8–5.6)
MEAN PLASMA GLUCOSE: 177 mg/dL

## 2016-10-08 LAB — BASIC METABOLIC PANEL
Anion gap: 7 (ref 5–15)
BUN: 36 mg/dL — AB (ref 6–20)
CALCIUM: 8.3 mg/dL — AB (ref 8.9–10.3)
CHLORIDE: 104 mmol/L (ref 101–111)
CO2: 24 mmol/L (ref 22–32)
CREATININE: 1.95 mg/dL — AB (ref 0.61–1.24)
GFR, EST AFRICAN AMERICAN: 37 mL/min — AB (ref >60)
GFR, EST NON AFRICAN AMERICAN: 32 mL/min — AB (ref >60)
Glucose, Bld: 107 mg/dL — ABNORMAL HIGH (ref 65–99)
Potassium: 4 mmol/L (ref 3.5–5.1)
SODIUM: 135 mmol/L (ref 135–145)

## 2016-10-08 LAB — GLUCOSE, CAPILLARY: GLUCOSE-CAPILLARY: 102 mg/dL — AB (ref 65–99)

## 2016-10-08 MED ORDER — HYDRALAZINE HCL 25 MG PO TABS: 25.0000 mg | ORAL_TABLET | Freq: Three times a day (TID) | ORAL | 2 refills | Status: AC

## 2016-10-08 MED ORDER — ASPIRIN 81 MG PO CHEW: 81.0000 mg | CHEWABLE_TABLET | Freq: Every day | ORAL | 2 refills | Status: AC

## 2016-10-08 MED ORDER — AMLODIPINE BESYLATE 5 MG PO TABS: 5.0000 mg | ORAL_TABLET | Freq: Every day | ORAL | 2 refills | Status: AC

## 2016-10-08 MED ORDER — CLOPIDOGREL BISULFATE 75 MG PO TABS: 75.0000 mg | ORAL_TABLET | Freq: Every day | ORAL | 2 refills | Status: DC

## 2016-10-08 NOTE — Progress Notes (Signed)
Discharge instructions along with home medications and follow up gone over with patient and wife. Both verbalize that they understood instructions. 4 prescriptions given to patient. IV and tele removed. Pt being discharged home on room air, no distress noted. Ammie Dalton, RN

## 2016-10-08 NOTE — Discharge Instructions (Signed)
Fall and aspiration precaution. HHPT with RN, PT, AID and speech therapy. Heart healthy and ADA diet.

## 2016-10-08 NOTE — Clinical Social Work Note (Signed)
CSW received consult for SNF placement. The patient has decliened SNF and has chosed HH. CSW is signing off. RNCM is aware and following. Please consult should any other needs arise.  Santiago Bumpers, MSW, Latanya Presser [phone]

## 2016-10-08 NOTE — Discharge Summary (Signed)
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 Arthritis 2008  . Cancer [facility]) 1991   Colon; right kidney ?  . Cardiomyopathy ([place])    a. ? isch vs nicm;  b. 05/2015 Echo: EF 35-40%, mild MR, mildly dil LA, poor acoustic windows.  . CKD (chronic kidney disease), stage III   . Diverticulosis   . DM type 2 (diabetes mellitus, type 2) ([place]) 2008  . Hyperlipidemia   . Hypertension   . Kidney stone   . Stroke [facility]    a. 05/2015 MRI: acute to early subacute R thalamic infarct, chronic L thalamic infarct, late subacute to chronic right occipital infarct;  b. 05/2015 Carotid U/S: RICA 0-86%V, LICA 7-84%O, patent vertebrals;  b. 06/2015 Event Monitor: RSR, rare PVC's.   HOSPITAL COURSE:  Acute CVA with generalized weakness, slurred speech and ambulatory difficulty MRI of the brain showed acute right PCA infarct, MRA shows an occluded right PCA. Carotid duplex didn't show any significant stenosis. Continue aspirin 81 mg by mouth daily, add Plavix per Dr. Doy Mince, continue Crestor. Neuro check, speech study, echocardiogram: is normal.  PT, OT: SNF.  ARF on chronic kidney disease stage III with dehydration  Improving with IV fluid to support and follow-up BMP as outpatient. hold the Lasix,  lisinopril.   Malignant hypertension:  The patient was started Norvasc, Coreg 25 mg by mouth twice a day, added hydralazine 25 mg every 8 hours, hold lisinopril, furosemide because of renal insufficiency.  Diabetestype II:  On decrease thed Levemir dose 15 units bid and sliding scale. Resume home dose after discharge.  PT and OT evaluations suggest skilled nursing facility placement. But the patient's wife and daughter want the patient to be discharged to home with home health and PT. DISCHARGE CONDITIONS:  Stable, discharge to home with home health and PT today. CONSULTS OBTAINED:  Treatment Team:  Alexis Goodell, MD DRUG ALLERGIES:   Allergies  Allergen Reactions  . Toujeo Solostar [Insulin Glargine] Other (See Comments)    Reaction : Numbness in arm, tingling   DISCHARGE MEDICATIONS:   Allergies as of 10/08/2016      Reactions   Toujeo Solostar [insulin Glargine] Other (See Comments)   Reaction : Numbness in arm, tingling      Medication List    STOP taking these medications   aspirin 325 MG tablet Replaced by:  aspirin 81 MG chewable tablet   furosemide 20 MG tablet Commonly known as:  LASIX   lisinopril 40 MG tablet Commonly known as:  PRINIVIL,ZESTRIL     TAKE these medications   amLODipine 5 MG tablet Commonly known as:  NORVASC Take 1 tablet (5 mg total) by mouth daily.   aspirin 81 MG chewable tablet Chew 1 tablet (81 mg total) by mouth daily. Replaces:  aspirin 325 MG tablet   carvedilol 25 MG tablet Commonly known as:  COREG Take 1 tablet (25 mg total) by mouth 2 (two) times daily with a meal.   clopidogrel 75 MG tablet Commonly known as:  PLAVIX Take 1 tablet (75 mg total) by mouth daily.   docusate sodium 100 MG capsule Commonly known as:  COLACE Take 2 capsules (200 mg total) by mouth 2 (two) times daily. What changed:  when to take this  reasons to take this   hydrALAZINE 25 MG tablet Commonly known as:  APRESOLINE Take 1 tablet  (25 mg total) by mouth every 8 (eight) hours.   Insulin Detemir 100 UNIT/ML Pen Commonly known as:  LEVEMIR FLEXTOUCH Inject 22 Units into the skin 2 (two) times daily. What changed:  Another medication with the same name was removed. Continue taking this medication, and follow the directions you see here.   Insulin Pen Needle 32G X 4 MM Misc Commonly known as:  BD PEN NEEDLE NANO U/F Use for insulin administration three times daily   rosuvastatin 20 MG tablet Commonly known as:  CRESTOR Take 1 tablet (20 mg total) by mouth daily.   tamsulosin 0.4 MG Caps capsule Commonly known as:  FLOMAX TAKE ONE CAPSULE BY MOUTH ONCE DAILY        DISCHARGE INSTRUCTIONS:  See AVS.   If you experience worsening of your admission symptoms, develop shortness of breath, life threatening emergency, suicidal or homicidal thoughts you must seek medical attention immediately by calling 911 or calling your MD immediately  if symptoms less severe.  You Must read complete instructions/literature along with all the possible adverse reactions/side effects for all the Medicines you take and that have been prescribed to you. Take any new Medicines after you have completely understood and accpet all the possible adverse reactions/side effects.   Please note  You were cared for by a hospitalist during your hospital stay. If you have any questions about your discharge medications or the care you received while you were in the hospital after you are discharged, you can call the unit and asked to speak with the hospitalist on call if the hospitalist that took care of you is not available. Once you are discharged, your primary care physician will handle any further medical issues. Please note that NO REFILLS for any discharge medications will be authorized once you are discharged, as it is imperative that you return to your primary care physician (or establish a relationship with a primary care physician if you do not  have one) for your aftercare needs so that they can reassess your need for medications and monitor your lab values.    On the day of Discharge:  VITAL SIGNS:  Blood pressure 136/70, pulse 68, temperature 98.7 F (37.1 C), temperature source Oral, resp. rate 18, height 6\' 2"  (1.88 m), weight 224 lb 12.8 oz (102 kg), SpO2 96 %. PHYSICAL EXAMINATION:  GENERAL:  76 y.o.-year-old patient lying in the bed with no acute distress.  EYES: Pupils equal, round, reactive to light and accommodation. No scleral icterus. Extraocular muscles intact.  HEENT: Head atraumatic, normocephalic. Oropharynx and nasopharynx clear.  NECK:  Supple, no jugular venous distention. No thyroid enlargement, no tenderness.  LUNGS: Normal breath sounds bilaterally, no wheezing, rales,rhonchi or crepitation. No use of accessory muscles of respiration.  CARDIOVASCULAR: S1, S2 normal. No murmurs, rubs, or gallops.  ABDOMEN: Soft, non-tender, non-distended. Bowel sounds present. No organomegaly or mass.  EXTREMITIES: No pedal edema, cyanosis, or clubbing.  NEUROLOGIC: Slurred speech. Muscle strength 5/5 in all extremities. Sensation intact.  Gait not checked.  PSYCHIATRIC: The patient is alert and oriented x 3.  SKIN: No obvious rash, lesion, or ulcer.  DATA REVIEW:   CBC  Recent Labs Lab 10/07/16 0614  WBC 10.0  HGB 11.1*  HCT 33.0*  PLT 139*    Chemistries   Recent Labs Lab 10/06/16 1407  10/08/16 0647  NA 134*  < > 135  K 4.2  < > 4.0  CL 101  < > 104  CO2 24  < > 24  GLUCOSE 267*  < > 107*  BUN 45*  < > 36*  CREATININE 2.47*  < > 1.95*  CALCIUM 9.0  < > 8.3*  AST 20  --   --   ALT 19  --   --   ALKPHOS 53  --   --   BILITOT 1.4*  --   --   < > = values in this interval not displayed.   Microbiology Results  Results for orders placed or performed during the hospital encounter of 03/12/15  Urine culture     Status: None   Collection Time: 03/12/15  7:39 AM  Result Value Ref Range Status    Specimen Description URINE, RANDOM  Final   Special Requests NONE  Final   Culture >=100,000 COLONIES/mL ENTEROCOCCUS FAECALIS  Final   Report Status 03/17/2015 FINAL  Final   Organism ID, Bacteria ENTEROCOCCUS FAECALIS  Final      Susceptibility   Enterococcus faecalis - MIC*    AMPICILLIN <=2 SENSITIVE Sensitive     LINEZOLID 2 SENSITIVE Sensitive     CIPROFLOXACIN Value in next row Sensitive      SENSITIVE<=0.5    LEVOFLOXACIN Value in next row Sensitive      SENSITIVE0.5    NITROFURANTOIN Value in next row Sensitive      SENSITIVE<=16    TETRACYCLINE Value in next row Intermediate      INTERMEDIATE8    * >=100,000 COLONIES/mL ENTEROCOCCUS FAECALIS    RADIOLOGY:  No results found.   Management plans discussed with the patient, his wife and they are in agreement.  CODE STATUS: Full Code   TOTAL TIME TAKING CARE OF THIS PATIENT: 37 minutes.    Demetrios Loll M.D on 10/08/2016 at 12:28 PM  Between 7am to 6pm - Pager - [phone]  After 6pm go to [email or web address] - Proofreader  [facility]  Office  [phone]  CC: Primary care physician; Coral Spikes, DO   Note: This dictation was prepared with Dragon dictation along with smaller phrase technology. Any transcriptional errors that result from this process are unintentional.

## 2016-10-08 NOTE — Care Management Note (Addendum)
Case Management Note  Patient Details  Name: Jacob Buck MRN: [id number] Date of Birth: 15-Mar-1941  Subjective/Objective:      Discussed discharge planning with wife Jacob Buck. A referral for HH=PT, OT, RN, Aide, Speech was called to [facility] at [place] per [facility] in network [facility]. Already has all needed DME at home per wife.              Action/Plan:   Expected Discharge Date:  10/08/16               Expected Discharge Plan:     In-House Referral:     Discharge planning Services     Post Acute Care Choice:    Choice offered to:     DME Arranged:    DME Agency:     HH Arranged:    HH Agency:     Status of Service:     If discussed at [employer], dates discussed:    Additional Comments:  [name] A, RN 10/08/2016, 11:13 AM

## 2016-10-09 ENCOUNTER — Emergency Department
Admission: EM | Admit: 2016-10-09 | Discharge: 2016-10-10 | Disposition: A | Discharge status: Home / Self Care | Payer: Medicare Other | Attending: Emergency Medicine | Admitting: Emergency Medicine

## 2016-10-09 ENCOUNTER — Emergency Department: Payer: Medicare Other

## 2016-10-09 ENCOUNTER — Encounter: Payer: Self-pay | Admitting: Emergency Medicine

## 2016-10-09 DIAGNOSIS — I129 Hypertensive chronic kidney disease with stage 1 through stage 4 chronic kidney disease, or unspecified chronic kidney disease: Secondary | ICD-10-CM | POA: Diagnosis not present

## 2016-10-09 DIAGNOSIS — Z794 Long term (current) use of insulin: Secondary | ICD-10-CM | POA: Diagnosis not present

## 2016-10-09 DIAGNOSIS — R531 Weakness: Secondary | ICD-10-CM | POA: Diagnosis not present

## 2016-10-09 DIAGNOSIS — N183 Chronic kidney disease, stage 3 (moderate): Secondary | ICD-10-CM | POA: Insufficient documentation

## 2016-10-09 DIAGNOSIS — I2510 Atherosclerotic heart disease of native coronary artery without angina pectoris: Secondary | ICD-10-CM | POA: Insufficient documentation

## 2016-10-09 DIAGNOSIS — Z7982 Long term (current) use of aspirin: Secondary | ICD-10-CM | POA: Diagnosis not present

## 2016-10-09 DIAGNOSIS — Z87891 Personal history of nicotine dependence: Secondary | ICD-10-CM | POA: Diagnosis not present

## 2016-10-09 DIAGNOSIS — E1122 Type 2 diabetes mellitus with diabetic chronic kidney disease: Secondary | ICD-10-CM | POA: Insufficient documentation

## 2016-10-09 LAB — BASIC METABOLIC PANEL
Anion gap: 7 (ref 5–15)
BUN: 30 mg/dL — AB (ref 6–20)
CHLORIDE: 103 mmol/L (ref 101–111)
CO2: 25 mmol/L (ref 22–32)
Calcium: 8.9 mg/dL (ref 8.9–10.3)
Creatinine, Ser: 2 mg/dL — ABNORMAL HIGH (ref 0.61–1.24)
GFR, EST AFRICAN AMERICAN: 36 mL/min — AB (ref >60)
GFR, EST NON AFRICAN AMERICAN: 31 mL/min — AB (ref >60)
Glucose, Bld: 79 mg/dL (ref 65–99)
POTASSIUM: 4 mmol/L (ref 3.5–5.1)
SODIUM: 135 mmol/L (ref 135–145)

## 2016-10-09 LAB — GLUCOSE, CAPILLARY
GLUCOSE-CAPILLARY: 189 mg/dL — AB (ref 65–99)
Glucose-Capillary: 75 mg/dL (ref 65–99)

## 2016-10-09 LAB — CBC
HEMATOCRIT: 33.6 % — AB (ref 40.0–52.0)
HEMOGLOBIN: 11.2 g/dL — AB (ref 13.0–18.0)
MCH: 30.8 pg (ref 26.0–34.0)
MCHC: 33.4 g/dL (ref 32.0–36.0)
MCV: 92.2 fL (ref 80.0–100.0)
Platelets: 162 10*3/uL (ref 150–440)
RBC: 3.65 MIL/uL — AB (ref 4.40–5.90)
RDW: 13.3 % (ref 11.5–14.5)
WBC: 13.7 10*3/uL — AB (ref 3.8–10.6)

## 2016-10-09 MED ORDER — CLOPIDOGREL BISULFATE 75 MG PO TABS
75.0000 mg | ORAL_TABLET | Freq: Every day | ORAL | Status: DC
  Administered 2016-10-09 – 2016-10-10 (×2): 75 mg via ORAL
  Filled 2016-10-09 (×2): qty 1

## 2016-10-09 MED ORDER — DOCUSATE SODIUM 100 MG PO CAPS
200.0000 mg | ORAL_CAPSULE | Freq: Two times a day (BID) | ORAL | Status: DC
  Administered 2016-10-10: 200 mg via ORAL
  Filled 2016-10-09: qty 2

## 2016-10-09 MED ORDER — INSULIN DETEMIR 100 UNIT/ML ~~LOC~~ SOLN
22.0000 [IU] | Freq: Two times a day (BID) | SUBCUTANEOUS | Status: DC
  Administered 2016-10-09 – 2016-10-10 (×2): 22 [IU] via SUBCUTANEOUS
  Filled 2016-10-09 (×4): qty 0.22

## 2016-10-09 MED ORDER — AMLODIPINE BESYLATE 5 MG PO TABS
5.0000 mg | ORAL_TABLET | Freq: Every day | ORAL | Status: DC
  Administered 2016-10-09 – 2016-10-10 (×2): 5 mg via ORAL
  Filled 2016-10-09 (×2): qty 1

## 2016-10-09 MED ORDER — CARVEDILOL 25 MG PO TABS
25.0000 mg | ORAL_TABLET | Freq: Two times a day (BID) | ORAL | Status: DC
  Administered 2016-10-09 – 2016-10-10 (×2): 25 mg via ORAL
  Filled 2016-10-09 (×2): qty 1

## 2016-10-09 MED ORDER — ROSUVASTATIN CALCIUM 20 MG PO TABS
20.0000 mg | ORAL_TABLET | Freq: Every day | ORAL | Status: DC
  Administered 2016-10-09 – 2016-10-10 (×2): 20 mg via ORAL
  Filled 2016-10-09 (×2): qty 1

## 2016-10-09 MED ORDER — HYDRALAZINE HCL 50 MG PO TABS
25.0000 mg | ORAL_TABLET | Freq: Three times a day (TID) | ORAL | Status: DC
  Administered 2016-10-09 – 2016-10-10 (×2): 25 mg via ORAL
  Filled 2016-10-09 (×2): qty 1

## 2016-10-09 MED ORDER — ASPIRIN 81 MG PO CHEW
81.0000 mg | CHEWABLE_TABLET | Freq: Every day | ORAL | Status: DC
Start: 2016-10-09 — End: 2016-10-10
  Administered 2016-10-09 – 2016-10-10 (×2): 81 mg via ORAL
  Filled 2016-10-09 (×2): qty 1

## 2016-10-09 MED ORDER — INSULIN DETEMIR 100 UNIT/ML FLEXPEN: 22.0000 [IU] | PEN_INJECTOR | Freq: Two times a day (BID) | SUBCUTANEOUS | Status: DC

## 2016-10-09 MED ORDER — TAMSULOSIN HCL 0.4 MG PO CAPS
0.4000 mg | ORAL_CAPSULE | Freq: Every day | ORAL | Status: DC
  Administered 2016-10-09 – 2016-10-10 (×2): 0.4 mg via ORAL
  Filled 2016-10-09 (×2): qty 1

## 2016-10-09 NOTE — ED Notes (Signed)
Pt cleaned and dry at this time,

## 2016-10-09 NOTE — Progress Notes (Addendum)
LCSW met with family and collected data to complete assessment and Fl2. Information was sent out to several facilities.  LCSW unable to obtain a PASSR number until offices re-open tomorrow, with out that patient is unable to go tonight.  [employer] LCSW [phone]

## 2016-10-09 NOTE — ED Notes (Signed)
Wife, Javell Blackburn [phone]

## 2016-10-09 NOTE — ED Provider Notes (Signed)
[facility] Emergency Department Provider Note   ____________________________________________    I have reviewed the triage vital signs and the nursing notes.   HISTORY  Chief Complaint Weakness     HPI Jacob Buck is a 76 y.o. male who presents with weakness. Patient was discharged from the hospital yesterday status post CVA. It sounds like he was discharged at his insistence by hospital staff recommended rehabilitation. Today he presents with an episode of weakness. Family reports he was slumped over in his chair. He states that he was just tired. Regardless family is unable to care for him because he has no strength to help with bathing or movement.    Past Medical History:  Diagnosis Date  . Arthritis 2008  . Cancer [facility]) 1991   Colon; right kidney ?  . Cardiomyopathy ([place])    a. ? isch vs nicm;  b. 05/2015 Echo: EF 35-40%, mild MR, mildly dil LA, poor acoustic windows.  . CKD (chronic kidney disease), stage III   . Diverticulosis   . DM type 2 (diabetes mellitus, type 2) ([place]) 2008  . Hyperlipidemia   . Hypertension   . Kidney stone   . Stroke [facility])    a. 05/2015 MRI: acute to early subacute R thalamic infarct, chronic L thalamic infarct, late subacute to chronic right occipital infarct;  b. 05/2015 Carotid U/S: RICA 7-06%C, LICA 3-76%E, patent vertebrals;  b. 06/2015 Event Monitor: RSR, rare PVC's.    Patient Active Problem List   Diagnosis Date Noted  . Weakness 10/06/2016  . Aortic atherosclerosis ([place]) 04/27/2016  . Coronary artery disease involving native heart without angina pectoris 04/27/2016  . Mild obstructive sleep apnea 08/02/2015  . Stroke ([place]) 07/21/2015  . Morbid obesity ([place]) 07/21/2015  . Cardiomyopathy, ischemic 06/12/2015  . Anemia 05/31/2015  . Essential hypertension 05/31/2015  . Hyperlipidemia 05/31/2015  . Obesity 05/31/2015  . Non-compliant behavior 12/09/2014  . Type 2 diabetes, uncontrolled, with  renal manifestation ([place]) 06/05/2014  . CKD stage 3 due to type 2 diabetes mellitus ([place]) 06/05/2014  . Diabetic retinopathy ([place]) 06/05/2014  . Diabetic neuropathy ([place]) 06/05/2014  . Status post amputation of lesser toe of right foot ([place]) 06/05/2014  . Essential hypertension, benign 06/05/2014  . History of colon cancer 06/05/2014    Past Surgical History:  Procedure Laterality Date  . COLON SURGERY  2008   carcinoid  . COLONOSCOPY  2003, 2014  . CYSTOSCOPY WITH DIRECT VISION INTERNAL URETHROTOMY N/A 04/28/2015   Procedure: CYSTOSCOPY WITH DIRECT VISION INTERNAL URETHROTOMY WITH HOLMIUM LASER ;  Surgeon: Royston Cowper, MD;  Location: [facility] ORS;  Service: Urology;  Laterality: N/A;  . FOOT SURGERY Right 2013    Prior to Admission medications   Medication Sig Start Date End Date Taking? Authorizing Provider  amLODipine (NORVASC) 5 MG tablet Take 1 tablet (5 mg total) by mouth daily. 10/08/16  Yes Demetrios Loll, MD  aspirin 81 MG chewable tablet Chew 1 tablet (81 mg total) by mouth daily. 10/08/16  Yes Demetrios Loll, MD  carvedilol (COREG) 25 MG tablet Take 1 tablet (25 mg total) by mouth 2 (two) times daily with a meal. 04/27/16  Yes Minna Merritts, MD  clopidogrel (PLAVIX) 75 MG tablet Take 1 tablet (75 mg total) by mouth daily. 10/08/16  Yes Demetrios Loll, MD  docusate sodium (COLACE) 100 MG capsule Take 2 capsules (200 mg total) by mouth 2 (two) times daily. Patient taking differently: Take 200 mg by mouth  as needed.  04/28/15  Yes Royston Cowper, MD  hydrALAZINE (APRESOLINE) 25 MG tablet Take 1 tablet (25 mg total) by mouth every 8 (eight) hours. 10/08/16  Yes Demetrios Loll, MD  Insulin Detemir (LEVEMIR FLEXTOUCH) 100 UNIT/ML Pen Inject 22 Units into the skin 2 (two) times daily. 12/28/15  Yes Coral Spikes, DO  rosuvastatin (CRESTOR) 20 MG tablet Take 1 tablet (20 mg total) by mouth daily. 12/29/15  Yes Coral Spikes, DO  Insulin Pen Needle (BD PEN NEEDLE NANO U/F) 32G X 4 MM MISC Use for insulin  administration three times daily Patient taking differently: Use for insulin administration two times daily 06/05/14   Radhika P Phadke, MD  tamsulosin (FLOMAX) 0.4 MG CAPS capsule TAKE ONE CAPSULE BY MOUTH ONCE DAILY Patient not taking: Reported on 10/09/2016 08/21/15   Rubbie Battiest, NP     Allergies Toujeo solostar [insulin glargine]  Family History  Problem Relation Age of Onset  . Heart disease Father     mild MI  . Diabetes Sister   . Alzheimer's disease Sister   . Diabetes Brother   . Alzheimer's disease Brother     Social History Social History  Substance Use Topics  . Smoking status: Former Research scientist (life sciences)  . Smokeless tobacco: Never Used  . Alcohol use No    Review of Systems  Constitutional: NoDizziness Eyes: No visual changes.  ENT: No sore throat. Cardiovascular: Denies chest pain. Respiratory: Denies shortness of breath. Gastrointestinal: No abdominal pain.  No nausea, no vomiting.   Genitourinary: Negative for dysuria. Musculoskeletal: Negative for back pain. Skin: Negative for rash. Neurological: Negative for headaches  10-point ROS otherwise negative.  ____________________________________________   PHYSICAL EXAM:  VITAL SIGNS: ED Triage Vitals  Enc Vitals Group     BP 10/09/16 1211 (!) 146/78     Pulse Rate 10/09/16 1211 63     Resp 10/09/16 1211 16     Temp 10/09/16 1211 98.4 F (36.9 C)     Temp Source 10/09/16 1211 Oral     SpO2 10/09/16 1211 96 %     Weight --      Height --      Head Circumference --      [facility] Flow --      Pain Score 10/09/16 1207 0     Pain Loc --      Pain Edu? --      Excl. in [place]? --     Constitutional: Alert and oriented. No acute distress. Pleasant and interactive  Nose: No congestion/rhinnorhea. Mouth/Throat: Mucous membranes are moist.    Cardiovascular: Normal rate, regular rhythm. Grossly normal heart sounds.  Good peripheral circulation. Respiratory: Normal respiratory effort.  No retractions. Lungs  CTAB. Gastrointestinal: Soft and nontender. No distention.  No CVA tenderness. Genitourinary: deferred Musculoskeletal:  Warm and well perfused Neurologic:  Chronic slurred speech.. No gross focal neurologic deficits are appreciated.  Skin:  Skin is warm, dry and intact. No rash noted. Psychiatric: Mood and affect are normal. Speech and behavior are normal.  ____________________________________________   LABS (all labs ordered are listed, but only abnormal results are displayed)  Labs Reviewed  BASIC METABOLIC PANEL - Abnormal; Notable for the following:       Result Value   BUN 30 (*)    Creatinine, Ser 2.00 (*)    GFR calc non Af Amer 31 (*)    GFR calc Af Amer 36 (*)    All other components within normal limits  CBC - Abnormal; Notable for the following:    WBC 13.7 (*)    RBC 3.65 (*)    Hemoglobin 11.2 (*)    HCT 33.6 (*)    All other components within normal limits  GLUCOSE, CAPILLARY  URINALYSIS, COMPLETE (UACMP) WITH MICROSCOPIC  CBG MONITORING, ED   ____________________________________________  EKG  ED ECG REPORT I, Lavonia Drafts, the attending physician, personally viewed and interpreted this ECG.  Date: 10/09/2016 EKG Time: 12:14 PM Rate: 64 Rhythm: normal sinus rhythm QRS Axis: normal Intervals: normal ST/T Wave abnormalities: normal   ____________________________________________  RADIOLOGY  CT head ____________________________________________   PROCEDURES  Procedure(s) performed: No    Critical Care performed: No ____________________________________________   INITIAL IMPRESSION / ASSESSMENT AND PLAN / ED COURSE  Pertinent labs & imaging results that were available during my care of the patient were reviewed by me and considered in my medical decision making (see chart for details).  Patient overall well-appearing and in no acute distress. Lab work is unremarkable. CT is consistent with what would be expected status post CVA. It  appears the patient is primarily here because family is unable to care for him as they thought they may be able to.   Discussed with case management, we will consult social work for evaluation or placement to rehabilitation    ____________________________________________   FINAL CLINICAL IMPRESSION(S) / ED DIAGNOSES  Final diagnoses:  Weakness      NEW MEDICATIONS STARTED DURING THIS VISIT:  New Prescriptions   No medications on file     Note:  This document was prepared using Dragon voice recognition software and may include unintentional dictation errors.    Lavonia Drafts, MD 10/09/16 1534

## 2016-10-09 NOTE — ED Notes (Signed)
Sandwich tray given at this time 

## 2016-10-09 NOTE — ED Notes (Signed)
Pt changed from BM at this time, pt clean and dry.

## 2016-10-09 NOTE — NC FL2 (Signed)
[place] LEVEL OF CARE SCREENING TOOL     IDENTIFICATION  Patient Name: Jacob Buck Birthdate: March 30, 1941 Sex: male Admission Date (Current Location): 10/09/2016  [place] [facility] and [place] Number:  Engineering geologist and Address:  [facility], [address] [place], [place] [address]      Provider Number: [id number]  Attending Physician Name and Address:  Lavonia Drafts, MD  Relative Name and Phone Number:       Current Level of Care: SNF Recommended Level of Care: [place] Prior Approval Number:    Date Approved/Denied:   PASRR Number:    Discharge Plan: SNF    Current Diagnoses: Patient Active Problem List   Diagnosis Date Noted  . Weakness 10/06/2016  . Aortic atherosclerosis ([place]) 04/27/2016  . Coronary artery disease involving native heart without angina pectoris 04/27/2016  . Mild obstructive sleep apnea 08/02/2015  . Stroke ([place]) 07/21/2015  . Morbid obesity ([place]) 07/21/2015  . Cardiomyopathy, ischemic 06/12/2015  . Anemia 05/31/2015  . Essential hypertension 05/31/2015  . Hyperlipidemia 05/31/2015  . Obesity 05/31/2015  . Non-compliant behavior 12/09/2014  . Type 2 diabetes, uncontrolled, with renal manifestation ([place]) 06/05/2014  . CKD stage 3 due to type 2 diabetes mellitus ([place]) 06/05/2014  . Diabetic retinopathy ([place]) 06/05/2014  . Diabetic neuropathy ([place]) 06/05/2014  . Status post amputation of lesser toe of right foot ([place]) 06/05/2014  . Essential hypertension, benign 06/05/2014  . History of colon cancer 06/05/2014    Orientation RESPIRATION BLADDER Height & Weight     Self, Time, Situation, Place  Normal Incontinent Weight:   Height:     BEHAVIORAL SYMPTOMS/MOOD NEUROLOGICAL BOWEL NUTRITION STATUS      Incontinent Diet (Diabetic)  AMBULATORY STATUS COMMUNICATION OF NEEDS Skin   Limited Assist Verbally Normal                       Personal Care Assistance Level of Assistance   Bathing, Feeding, Dressing, Total care Bathing Assistance: Limited assistance Feeding assistance: Independent Dressing Assistance: Limited assistance Total Care Assistance: Limited assistance   Functional Limitations Info  Sight, Hearing, Speech Sight Info: Adequate Hearing Info: Adequate Speech Info: Impaired (slurred)    SPECIAL CARE FACTORS FREQUENCY  PT (By licensed PT), OT (By licensed OT)     PT Frequency: x5 OT Frequency: x5            Contractures Contractures Info: Not present    Additional Factors Info  Code Status (Full code) Code Status Info: full             Current Medications (10/09/2016):  This is the current hospital active medication list No current facility-administered medications for this encounter.    Current Outpatient Prescriptions  Medication Sig Dispense Refill  . amLODipine (NORVASC) 5 MG tablet Take 1 tablet (5 mg total) by mouth daily. 30 tablet 2  . aspirin 81 MG chewable tablet Chew 1 tablet (81 mg total) by mouth daily. 30 tablet 2  . carvedilol (COREG) 25 MG tablet Take 1 tablet (25 mg total) by mouth 2 (two) times daily with a meal. 180 tablet 3  . clopidogrel (PLAVIX) 75 MG tablet Take 1 tablet (75 mg total) by mouth daily. 30 tablet 2  . docusate sodium (COLACE) 100 MG capsule Take 2 capsules (200 mg total) by mouth 2 (two) times daily. (Patient taking differently: Take 200 mg by mouth as needed. ) 120 capsule 3  . hydrALAZINE (APRESOLINE)  25 MG tablet Take 1 tablet (25 mg total) by mouth every 8 (eight) hours. 90 tablet 2  . Insulin Detemir (LEVEMIR FLEXTOUCH) 100 UNIT/ML Pen Inject 22 Units into the skin 2 (two) times daily. 15 mL 0  . rosuvastatin (CRESTOR) 20 MG tablet Take 1 tablet (20 mg total) by mouth daily. 90 tablet 3  . Insulin Pen Needle (BD PEN NEEDLE NANO U/F) 32G X 4 MM MISC Use for insulin administration three times daily (Patient taking differently: Use for insulin administration two times daily) 100 each 11  .  tamsulosin (FLOMAX) 0.4 MG CAPS capsule TAKE ONE CAPSULE BY MOUTH ONCE DAILY (Patient not taking: Reported on 10/09/2016) 30 capsule 11     Discharge Medications: Please see discharge summary for a list of discharge medications.  Relevant Imaging Results:  Relevant Lab Results:   Additional Information SSN [id number], [place], [place]

## 2016-10-09 NOTE — Clinical Social Work Note (Signed)
Clinical Social Work Assessment  Patient Details  Name: Jacob Buck MRN: [id number] Date of Birth: [birth year]  Date of referral:  10/09/16               Reason for consult:  Facility Placement                Permission sought to share information with:    Permission granted to share information::  Yes, Verbal Permission Granted  Name::        Agency::  All facilities  Relationship::     Contact Information:  [phone]  Housing/Transportation Living arrangements for the past 2 months:  [place] of Information:  Patient, Spouse Patient Interpreter Needed:  None Criminal Activity/Legal Involvement Pertinent to Current Situation/Hospitalization:  No - Comment as needed Significant Relationships:  Church, Spouse, Other Family Members, Adult Children Lives with:  Spouse Do you feel safe going back to the place where you live?  No Need for family participation in patient care:  Yes (Comment)  Care giving concerns:  LCSW met with patient and obtained verbal consent to complete his assessment with his wife and children, family unable to care for patient   Social Worker assessment / plan: Patient is 76 year old male who required PT X5  and OT x5 Patient has united healthcare /medicare and prefers a SNF to recover from recent stroke. Patient has good family support however they are struggling to care for patient at home. Patient is orient x4  And is diabetic and uses insulin. He has Neuropathy both legs and speech. He has numbness and weakness in hands and is unable to use right one at this time. Incontinent now. Patient gave verbal consent to speak to his family or facilities  His wife is  Jacob Buck [phone]  Employment status:  Retired Insurance underwriter information:  Information systems manager ([place]) PT Recommendations:  [place] / Referral to community resources:     Patient/Family's Response to care:  Unable to meet all of patients needs in  home  Patient/Family's Understanding of and Emotional Response to Diagnosis, Current Treatment, and Prognosis: They understand he will get better treatment at SNF  Emotional Assessment Appearance:  Appears stated age Attitude/Demeanor/Rapport:   (Polite) Affect (typically observed):  Accepting, Adaptable, Calm Orientation:    Alcohol / Substance use:  Not Applicable Psych involvement (Current and /or in the community):  No (Comment)  Discharge Needs  Concerns to be addressed:  No discharge needs identified Readmission within the last 30 days:  No Current discharge risk:  None Barriers to Discharge:  No Barriers Identified   Joana Reamer, LCSW 10/09/2016, 4:12 PM

## 2016-10-09 NOTE — ED Notes (Signed)
Pt placed in hospital bed at this time

## 2016-10-09 NOTE — ED Notes (Signed)
Pt attempted to give a urine sample but was unable.  Pt will try again in 42min.

## 2016-10-09 NOTE — ED Notes (Signed)
Dinner tray given at this time.  

## 2016-10-09 NOTE — Progress Notes (Signed)
LCSW has left a hand off message for weekday LCSW to contact PASSR number from [facility]- Patient has a current bed offer from [facility] however cant transfer until this number is obtained.  Spoke to Dr Lessie Dings and he agrees pt will remain in ED until Passr can be obtained.   [employer] LCSW [phone]

## 2016-10-09 NOTE — ED Triage Notes (Addendum)
Pt to ED via EMS from home, family states pt had episode of left side weakness and aphasia. Per EMS pt was A&Ox4 upon arrival with neg stroke screen. PT denies any weakness or pain at this time. Pt hx of stroke and currently undergoing speech therapy for slurred speech. pt recenelty discharged yesterday for stoke., pt VS stable

## 2016-10-10 ENCOUNTER — Telehealth: Payer: Self-pay | Admitting: Family Medicine

## 2016-10-10 ENCOUNTER — Telehealth: Payer: Self-pay | Admitting: *Deleted

## 2016-10-10 LAB — URINALYSIS, COMPLETE (UACMP) WITH MICROSCOPIC
Bacteria, UA: NONE SEEN
Bilirubin Urine: NEGATIVE
Glucose, UA: 50 mg/dL — AB
Hgb urine dipstick: NEGATIVE
Ketones, ur: NEGATIVE mg/dL
LEUKOCYTES UA: NEGATIVE
Nitrite: NEGATIVE
PROTEIN: 100 mg/dL — AB
SPECIFIC GRAVITY, URINE: 1.018 (ref 1.005–1.030)
pH: 5 (ref 5.0–8.0)

## 2016-10-10 LAB — GLUCOSE, CAPILLARY: GLUCOSE-CAPILLARY: 127 mg/dL — AB (ref 65–99)

## 2016-10-10 MED ORDER — ACETAMINOPHEN 325 MG PO TABS
650.0000 mg | ORAL_TABLET | Freq: Once | ORAL | Status: AC
  Administered 2016-10-10: 650 mg via ORAL
  Filled 2016-10-10: qty 2

## 2016-10-10 NOTE — Clinical Social Work Note (Addendum)
CSW contacted [facility], who requested copy of driver's license to be faxed to them to correct birthday in [place].  CSW faxed requested information awaiting for [facility] to correct information in order to get a [facility] number.  CSW was informed [facility] has bed at [facility] once [facility] number has been received.  10:10 am  CSW received [facility] back on patient, his [facility] number is [id number] [phone], CSW spoke to [facility] [place] and they are able to accept patient today.  Jones Broom. [place], MSW, [place]  10/10/2016 9:53 AM

## 2016-10-10 NOTE — Telephone Encounter (Signed)
Called patient for TCM no answer reviewed chart patient admitted to ER today and discharged to [facility]. FYI

## 2016-10-10 NOTE — Clinical Social Work Placement (Signed)
   CLINICAL SOCIAL WORK PLACEMENT  NOTE  Date:  10/10/2016  Patient Details  Name: Jacob Buck MRN: [id number] Date of Birth: 07-May-1941  Clinical Social Work is seeking post-discharge placement for this patient at the [place] level of care (*CSW will initial, date and re-position this form in  chart as items are completed):  Yes   Patient/family provided with [place] Work Department's list of facilities offering this level of care within the geographic area requested by the patient (or if unable, by the patient's family).  Yes   Patient/family informed of their freedom to choose among providers that offer the needed level of care, that participate in Medicare, Medicaid or managed care program needed by the patient, have an available bed and are willing to accept the patient.  Yes   Patient/family informed of [facility]'s ownership interest in [facility] and [facility], as well as of the fact that they are under no obligation to receive care at these facilities.  PASRR submitted to EDS on 10/10/16     PASRR number received on 10/10/16     Existing PASRR number confirmed on       FL2 transmitted to all facilities in geographic area requested by pt/family on 10/09/16     FL2 transmitted to all facilities within larger geographic area on       Patient informed that his/her managed care company has contracts with or will negotiate with certain facilities, including the following:        Yes   Patient/family informed of bed offers received.  Patient chooses bed at [facility]     Physician recommends and patient chooses bed at      Patient to be transferred to [facility] on 10/10/16.  Patient to be transferred to facility by [facility]     Patient family notified on 10/10/16 of transfer.  Name of family member notified:  Da Authement, wife     PHYSICIAN Please prepare priority discharge  summary, including medications     Additional Comment:    _______________________________________________ Ross Ludwig, LCSWA 10/10/2016, 11:40 AM

## 2016-10-10 NOTE — Discharge Instructions (Signed)
Please return to the emergency department for severe pain, changes in mental status, fever, or any other symptoms concerning to you.

## 2016-10-10 NOTE — Clinical Social Work Note (Signed)
Patient to be d/c'ed today to [facility] [place].  Patient and family agreeable to plans will transport via ems RN to call report [phone], room [place].  Evette Cristal, MSW, [place]

## 2016-10-10 NOTE — Telephone Encounter (Signed)
Verbal order was given. 

## 2016-10-10 NOTE — Telephone Encounter (Signed)
[facility] has requested a verbal order to evaluate pt. for home health  [place] [phone]

## 2016-10-10 NOTE — ED Notes (Signed)
Assisted RN with changing pt and changing bed linens.

## 2016-10-10 NOTE — Telephone Encounter (Signed)
Verbal order okay to be given?

## 2016-10-10 NOTE — Telephone Encounter (Signed)
Yes

## 2016-10-13 ENCOUNTER — Encounter: Payer: Self-pay | Admitting: Emergency Medicine

## 2016-10-13 ENCOUNTER — Emergency Department: Payer: Medicare Other

## 2016-10-13 ENCOUNTER — Emergency Department
Admission: EM | Admit: 2016-10-13 | Discharge: 2016-10-13 | Disposition: A | Discharge status: Home / Self Care | Payer: Medicare Other | Attending: Emergency Medicine | Admitting: Emergency Medicine

## 2016-10-13 DIAGNOSIS — Z794 Long term (current) use of insulin: Secondary | ICD-10-CM | POA: Insufficient documentation

## 2016-10-13 DIAGNOSIS — N183 Chronic kidney disease, stage 3 (moderate): Secondary | ICD-10-CM | POA: Diagnosis not present

## 2016-10-13 DIAGNOSIS — Z85038 Personal history of other malignant neoplasm of large intestine: Secondary | ICD-10-CM | POA: Diagnosis not present

## 2016-10-13 DIAGNOSIS — R112 Nausea with vomiting, unspecified: Secondary | ICD-10-CM | POA: Diagnosis present

## 2016-10-13 DIAGNOSIS — Z79899 Other long term (current) drug therapy: Secondary | ICD-10-CM | POA: Insufficient documentation

## 2016-10-13 DIAGNOSIS — R197 Diarrhea, unspecified: Secondary | ICD-10-CM | POA: Insufficient documentation

## 2016-10-13 DIAGNOSIS — E871 Hypo-osmolality and hyponatremia: Secondary | ICD-10-CM

## 2016-10-13 DIAGNOSIS — E1122 Type 2 diabetes mellitus with diabetic chronic kidney disease: Secondary | ICD-10-CM | POA: Insufficient documentation

## 2016-10-13 DIAGNOSIS — I129 Hypertensive chronic kidney disease with stage 1 through stage 4 chronic kidney disease, or unspecified chronic kidney disease: Secondary | ICD-10-CM | POA: Diagnosis not present

## 2016-10-13 DIAGNOSIS — Z87891 Personal history of nicotine dependence: Secondary | ICD-10-CM | POA: Insufficient documentation

## 2016-10-13 DIAGNOSIS — N289 Disorder of kidney and ureter, unspecified: Secondary | ICD-10-CM | POA: Insufficient documentation

## 2016-10-13 DIAGNOSIS — Z7982 Long term (current) use of aspirin: Secondary | ICD-10-CM | POA: Diagnosis not present

## 2016-10-13 DIAGNOSIS — R109 Unspecified abdominal pain: Secondary | ICD-10-CM

## 2016-10-13 LAB — COMPREHENSIVE METABOLIC PANEL
ALK PHOS: 68 U/L (ref 38–126)
ALT: 81 U/L — AB (ref 17–63)
AST: 74 U/L — AB (ref 15–41)
Albumin: 3.3 g/dL — ABNORMAL LOW (ref 3.5–5.0)
Anion gap: 11 (ref 5–15)
BILIRUBIN TOTAL: 1.8 mg/dL — AB (ref 0.3–1.2)
BUN: 54 mg/dL — AB (ref 6–20)
CALCIUM: 8.6 mg/dL — AB (ref 8.9–10.3)
CO2: 22 mmol/L (ref 22–32)
Chloride: 98 mmol/L — ABNORMAL LOW (ref 101–111)
Creatinine, Ser: 2.74 mg/dL — ABNORMAL HIGH (ref 0.61–1.24)
GFR calc Af Amer: 25 mL/min — ABNORMAL LOW (ref >60)
GFR, EST NON AFRICAN AMERICAN: 21 mL/min — AB (ref >60)
Glucose, Bld: 218 mg/dL — ABNORMAL HIGH (ref 65–99)
Potassium: 4 mmol/L (ref 3.5–5.1)
Sodium: 131 mmol/L — ABNORMAL LOW (ref 135–145)
TOTAL PROTEIN: 7.2 g/dL (ref 6.5–8.1)

## 2016-10-13 LAB — URINALYSIS, COMPLETE (UACMP) WITH MICROSCOPIC
Bacteria, UA: NONE SEEN
Bilirubin Urine: NEGATIVE
Ketones, ur: NEGATIVE mg/dL
Leukocytes, UA: NEGATIVE
NITRITE: NEGATIVE
PH: 5 (ref 5.0–8.0)
PROTEIN: 100 mg/dL — AB
Specific Gravity, Urine: 1.011 (ref 1.005–1.030)

## 2016-10-13 LAB — INFLUENZA PANEL BY PCR (TYPE A & B)
INFLBPCR: NEGATIVE
Influenza A By PCR: NEGATIVE

## 2016-10-13 LAB — TROPONIN I: Troponin I: <0.03 ng/mL (ref <0.03)

## 2016-10-13 LAB — CBC
HCT: 30.1 % — ABNORMAL LOW (ref 40.0–52.0)
Hemoglobin: 9.9 g/dL — ABNORMAL LOW (ref 13.0–18.0)
MCH: 30.2 pg (ref 26.0–34.0)
MCHC: 33 g/dL (ref 32.0–36.0)
MCV: 91.4 fL (ref 80.0–100.0)
PLATELETS: 220 10*3/uL (ref 150–440)
RBC: 3.29 MIL/uL — ABNORMAL LOW (ref 4.40–5.90)
RDW: 12.7 % (ref 11.5–14.5)
WBC: 14.1 10*3/uL — AB (ref 3.8–10.6)

## 2016-10-13 LAB — LIPASE, BLOOD: Lipase: 16 U/L (ref 11–51)

## 2016-10-13 MED ORDER — SODIUM CHLORIDE 0.9 % IV BOLUS (SEPSIS)
500.0000 mL | Freq: Once | INTRAVENOUS | Status: AC
  Administered 2016-10-13: 500 mL via INTRAVENOUS

## 2016-10-13 MED ORDER — SODIUM CHLORIDE 0.9 % IV BOLUS (SEPSIS)
1000.0000 mL | Freq: Once | INTRAVENOUS | Status: AC
  Administered 2016-10-13: 1000 mL via INTRAVENOUS

## 2016-10-13 MED ORDER — IOPAMIDOL (ISOVUE-300) INJECTION 61%
15.0000 mL | INTRAVENOUS | Status: AC
  Administered 2016-10-13 (×2): 15 mL via ORAL

## 2016-10-13 MED ORDER — ONDANSETRON HCL 4 MG/2ML IJ SOLN
4.0000 mg | Freq: Once | INTRAMUSCULAR | Status: AC
  Administered 2016-10-13: 4 mg via INTRAVENOUS
  Filled 2016-10-13: qty 2

## 2016-10-13 MED ORDER — LOPERAMIDE HCL 2 MG PO TABS: 2.0000 mg | ORAL_TABLET | Freq: Four times a day (QID) | ORAL | 0 refills | Status: DC | PRN

## 2016-10-13 MED ORDER — ONDANSETRON 4 MG PO TBDP: 4.0000 mg | ORAL_TABLET | Freq: Three times a day (TID) | ORAL | 0 refills | Status: DC | PRN

## 2016-10-13 NOTE — ED Notes (Signed)
Patient made aware of need of urine sample. States he is unable to void at this time. 

## 2016-10-13 NOTE — ED Triage Notes (Signed)
Per [facility], patient here from [facility] with c/o emesis x2 since last night. Patient denies any CP or abdominal pain. Patient states, "I just don't feel good. I think I am dehydrated". [facility] report emesis this morning was pink in color and had clots in it. Patient is on Plavix. A&O x4.

## 2016-10-13 NOTE — ED Notes (Signed)
EMS has arrived to transport [facility] back to [facility].

## 2016-10-13 NOTE — ED Notes (Signed)
Informed Emilie, ED secretary that patient will require non emergent transport back to [facility].

## 2016-10-13 NOTE — Discharge Instructions (Signed)
Please take a clear liquid diet for the next 24 hours, then advance to bland diet as tolerated. Please practice frequent and good handwashing to prevent the spread of infection.  Make an appointment with your primary care physician tomorrow to have your kidney function and urine electrolytes rechecked.  Return to the emergency department if you develop severe pain, inability to keep down fluids, lightheadedness or fainting, or any other symptoms concerning to you.

## 2016-10-13 NOTE — ED Provider Notes (Signed)
[facility] Emergency Department Provider Note  ____________________________________________  Time seen: Approximately 7:48 AM  I have reviewed the triage vital signs and the nursing notes.   HISTORY  Chief Complaint Emesis    HPI Jacob Buck is a 76 y.o. male w/ a recent admission for CVA, remote hx of partial colectomy for colon cancer, presenting from [facility] for n/v/d.  The patient reports that yesterday he had some mid chest burning sensation typical of reflux. Shortly after, he began to have multiple episodes of nausea vomiting and loose stool overnight. He did not have any abdominal pain, fever, chills, urinary symptoms, or known sick contacts.   Past Medical History:  Diagnosis Date  . Arthritis 2008  . Cancer [facility]) 1991   Colon; right kidney ?  . Cardiomyopathy ([place])    a. ? isch vs nicm;  b. 05/2015 Echo: EF 35-40%, mild MR, mildly dil LA, poor acoustic windows.  . CKD (chronic kidney disease), stage III   . Diverticulosis   . DM type 2 (diabetes mellitus, type 2) ([place]) 2008  . Hyperlipidemia   . Hypertension   . Kidney stone   . Stroke [facility])    a. 05/2015 MRI: acute to early subacute R thalamic infarct, chronic L thalamic infarct, late subacute to chronic right occipital infarct;  b. 05/2015 Carotid U/S: RICA 3-01%S, LICA 0-10%X, patent vertebrals;  b. 06/2015 Event Monitor: RSR, rare PVC's.    Patient Active Problem List   Diagnosis Date Noted  . Weakness 10/06/2016  . Aortic atherosclerosis ([place]) 04/27/2016  . Coronary artery disease involving native heart without angina pectoris 04/27/2016  . Mild obstructive sleep apnea 08/02/2015  . Stroke ([place]) 07/21/2015  . Morbid obesity ([place]) 07/21/2015  . Cardiomyopathy, ischemic 06/12/2015  . Anemia 05/31/2015  . Essential hypertension 05/31/2015  . Hyperlipidemia 05/31/2015  . Obesity 05/31/2015  . Non-compliant behavior 12/09/2014  . Type 2 diabetes, uncontrolled, with renal  manifestation ([place]) 06/05/2014  . CKD stage 3 due to type 2 diabetes mellitus ([place]) 06/05/2014  . Diabetic retinopathy ([place]) 06/05/2014  . Diabetic neuropathy ([place]) 06/05/2014  . Status post amputation of lesser toe of right foot ([place]) 06/05/2014  . Essential hypertension, benign 06/05/2014  . History of colon cancer 06/05/2014    Past Surgical History:  Procedure Laterality Date  . COLON SURGERY  2008   carcinoid  . COLONOSCOPY  2003, 2014  . CYSTOSCOPY WITH DIRECT VISION INTERNAL URETHROTOMY N/A 04/28/2015   Procedure: CYSTOSCOPY WITH DIRECT VISION INTERNAL URETHROTOMY WITH HOLMIUM LASER ;  Surgeon: Royston Cowper, MD;  Location: [facility] ORS;  Service: Urology;  Laterality: N/A;  . FOOT SURGERY Right 2013    Current Outpatient Rx  . Order #: [id number] Class: Print  . Order #: [id number] Class: Print  . Order #: [id number] Class: Normal  . Order #: [id number] Class: Print  . Order #: [id number] Class: Print  . Order #: [id number] Class: Print  . Order #: [id number] Class: Normal  . Order #: [id number] Class: Normal  . Order #: [id number] Class: Historical Med  . Order #: [id number] Class: Normal  . Order #: [id number] Class: Historical Med  . Order #: [id number] Class: Print  . Order #: [id number] Class: Print    Allergies Toujeo solostar [insulin glargine]  Family History  Problem Relation Age of Onset  . Heart disease Father     mild MI  . Diabetes Sister   . Alzheimer's disease Sister   . Diabetes Brother   . Alzheimer's disease Brother     Social History Social  History  Substance Use Topics  . Smoking status: Former Research scientist (life sciences)  . Smokeless tobacco: Never Used  . Alcohol use No    Review of Systems Constitutional: No fever/chills.Lightheadedness or syncope. Eyes: No visual changes. ENT: No sore throat. No congestion or rhinorrhea. Cardiovascular: Denies chest pain. Denies palpitations. Respiratory: Denies shortness of breath.  No cough. Gastrointestinal: No abdominal pain.   Positive nausea, positive vomiting.  Positive diarrhea.  No constipation. Genitourinary: Negative for dysuria. Musculoskeletal: Negative for back pain. Skin: Negative for rash. Neurological: Negative for headaches. No new focal numbness, tingling or weakness.   10-point ROS otherwise negative.  ____________________________________________   PHYSICAL EXAM:  VITAL SIGNS: ED Triage Vitals  Enc Vitals Group     BP 10/13/16 0717 (!) 164/81     Pulse Rate 10/13/16 0717 70     Resp 10/13/16 0717 16     Temp 10/13/16 0717 97.9 F (36.6 C)     Temp Source 10/13/16 0717 Oral     SpO2 10/13/16 0717 97 %     Weight 10/13/16 0718 246 lb (111.6 kg)     Height 10/13/16 0718 6\' 2"  (1.88 m)     Head Circumference --      [facility] Flow --      Pain Score 10/13/16 0717 0     Pain Loc --      Pain Edu? --      Excl. in [place]? --     Constitutional: Alert and oriented. Chronically ill appearing, uncomfortable, but nontoxic. Answers questions appropriately. Eyes: Conjunctivae are normal.  EOMI. No scleral icterus. Head: Atraumatic. Nose: No congestion/rhinnorhea. Mouth/Throat: Mucous membranes are moist.  Neck: No stridor.  Supple.  No meningismus. Cardiovascular: Normal rate, regular rhythm. No murmurs, rubs or gallops.  Respiratory: Normal respiratory effort.  No accessory muscle use or retractions. Lungs CTAB.  No wheezes, rales or ronchi. Gastrointestinal: Soft, nontender and mildly distended.  + Old surgical scar to R of umbilicus ~50".  No guarding or rebound.  No peritoneal signs. Musculoskeletal: No LE edema. No ttp in the calves or palpable cords.  Negative Homan's sign. Neurologic:  A&Ox3.  Speech is clear.  Face and smile are symmetric.  EOMI.  Moves all extremities well. Skin:  Skin is warm, dry and intact. No rash noted. Psychiatric: Mood and affect are normal. Speech and behavior are normal.  Normal judgement.  ____________________________________________   LABS (all labs ordered  are listed, but only abnormal results are displayed)  Labs Reviewed  COMPREHENSIVE METABOLIC PANEL - Abnormal; Notable for the following:       Result Value   Sodium 131 (*)    Chloride 98 (*)    Glucose, Bld 218 (*)    BUN 54 (*)    Creatinine, Ser 2.74 (*)    Calcium 8.6 (*)    Albumin 3.3 (*)    AST 74 (*)    ALT 81 (*)    Total Bilirubin 1.8 (*)    GFR calc non Af Amer 21 (*)    GFR calc Af Amer 25 (*)    All other components within normal limits  CBC - Abnormal; Notable for the following:    WBC 14.1 (*)    RBC 3.29 (*)    Hemoglobin 9.9 (*)    HCT 30.1 (*)    All other components within normal limits  LIPASE, BLOOD  TROPONIN I  INFLUENZA PANEL BY PCR (TYPE A & B)  URINALYSIS, COMPLETE (UACMP) WITH MICROSCOPIC  ____________________________________________  EKG  ED ECG REPORT I, Eula Listen, the attending physician, personally viewed and interpreted this ECG.   Date: 10/13/2016  EKG Time: 748  Rate: 70  Rhythm: normal sinus rhythm  Axis: leftward  Intervals:first-degree A-V block   ST&T Change: No STEMI.  + PAC  ____________________________________________  RADIOLOGY  Ct Abdomen Pelvis Wo Contrast  Result Date: 10/13/2016 CLINICAL DATA:  Vomiting. EXAM: CT ABDOMEN AND PELVIS WITHOUT CONTRAST TECHNIQUE: Multidetector CT imaging of the abdomen and pelvis was performed following the standard protocol without IV contrast. COMPARISON:  04/09/2016 FINDINGS: Lower chest: Trace bilateral pleural effusions. Heart is borderline enlarged. Diffuse coronary artery calcifications. Scattered aortic calcifications. Nodular density posteriorly in the right lower lobe which was not present on prior study, measuring 14 mm. Hepatobiliary: No focal hepatic abnormality. Gallbladder unremarkable. Pancreas: No focal abnormality or ductal dilatation. Spleen: No focal abnormality.  Normal size. Adrenals/Urinary Tract: Scattered bilateral renal stones, nonobstructing. There  appear to be small layering stones in the non dilated renal pelves bilaterally. No hydronephrosis. Stone noted posteriorly in the right posterior bladder, likely recently passed stone. No visible ureteral stones. Adrenal glands unremarkable. Stomach/Bowel: Descending colonic and sigmoid diverticulosis. No active diverticulitis. Mild gaseous distention of the stomach. Small bowel decompressed. Vascular/Lymphatic: Diffuse aortic and iliac calcifications. No aneurysm or adenopathy. Reproductive: No focal abnormality. Other: No free fluid or free air. Musculoskeletal: No acute bony abnormality. IMPRESSION: Bilateral nephrolithiasis. Small punctate layering nonobstructing stones in the renal pelves bilaterally. No ureteral stones or hydronephrosis. There is a small layering stone within the urinary bladder, likely recently passed stone. Trace bilateral pleural effusions.  Mild cardiomegaly. Coronary artery disease, aortic atherosclerosis. Left colonic diverticulosis.  No active diverticulitis. Electronically Signed   By: Rolm Baptise M.D.   On: 10/13/2016 10:32   Dg Chest 2 View  Result Date: 10/13/2016 CLINICAL DATA:  Nausea, vomiting last night EXAM: CHEST  2 VIEW COMPARISON:  Chest x-ray of 10/06/2016 FINDINGS: No active infiltrate or effusion is seen. The lungs are not optimally aerated. The heart is within normal limits in size. No bony abnormality is seen. IMPRESSION: No active cardiopulmonary disease. Electronically Signed   By: Ivar Drape M.D.   On: 10/13/2016 08:18    ____________________________________________   PROCEDURES  Procedure(s) performed: None  Procedures  Critical Care performed: No ____________________________________________   INITIAL IMPRESSION / ASSESSMENT AND PLAN / ED COURSE  Pertinent labs & imaging results that were available during my care of the patient were reviewed by me and considered in my medical decision making (see chart for details).  76 y.o. male with a  history of CVA in partial colon resection presenting with nausea vomiting and diarrhea. Overall, patient appears uncomfortable but has reassuring vital signs. He is afebrile. He has no focal tenderness to palpation on my examination. I am concerned about a viral or foodborne GI illness, but given his extensive abdominal surgery, partial small bowel obstruction will need to be excluded. Plan for symptomatic treatment, and CT. In addition, his screening EKG does not show ischemic changes but will get a troponin given the chest discomfort that he had yesterday and his high risk for cardiac disease. Plan reevaluation for final disposition.  ----------------------------------------- 9:51 AM on 10/13/2016 -----------------------------------------  The patient states that he is feeling better than when he arrived and his nausea is completely resolved. He was able to tolerate 2 bottles of contrast without vomiting. His laboratory studies do show an elevated white blood cell count, and his CMP is  consistent with dehydration with a mild bump in his creatinine and elevated BUN with hyponatremia. He has received intravenous fluid which should be correcting these abnormalities. His cardiac workup is negative with a negative troponin today. His influenza testing is also negative. I'm awaiting the results of a CT scan for final disposition but at this time his clinical course is reassuring.  ----------------------------------------- 10:43 AM on 10/13/2016 -----------------------------------------  The patient's CT scan does not show any acute intra-abdominal process. His symptoms have resolved. At this time, the patient is stable for discharge and I have called his primary care physician's office to see if he would be able to be reevaluated tomorrow. I discussed the follow-up plan with the patient and his family and they understand return precautions as well. ____________________________________________  FINAL  CLINICAL IMPRESSION(S) / ED DIAGNOSES  Final diagnoses:  Acute renal insufficiency  Nausea vomiting and diarrhea  Hyponatremia         NEW MEDICATIONS STARTED DURING THIS VISIT:  New Prescriptions   LOPERAMIDE (IMODIUM A-D) 2 MG TABLET    Take 1 tablet (2 mg total) by mouth 4 (four) times daily as needed for diarrhea or loose stools.   ONDANSETRON (ZOFRAN ODT) 4 MG DISINTEGRATING TABLET    Take 1 tablet (4 mg total) by mouth every 8 (eight) hours as needed for nausea or vomiting.      Eula Listen, MD 10/13/16 1043

## 2016-10-13 NOTE — ED Notes (Signed)
Family states "You will have to call [facility]. We are unable to take him back". Spoke with [place] from [facility]. She states, "We don't have anyone who can get him. You will have to call EMS".

## 2016-10-14 ENCOUNTER — Ambulatory Visit (INDEPENDENT_AMBULATORY_CARE_PROVIDER_SITE_OTHER): Payer: Medicare Other

## 2016-10-14 ENCOUNTER — Encounter: Payer: Self-pay | Admitting: Family Medicine

## 2016-10-14 ENCOUNTER — Ambulatory Visit (INDEPENDENT_AMBULATORY_CARE_PROVIDER_SITE_OTHER): Payer: Medicare Other | Admitting: Family Medicine

## 2016-10-14 ENCOUNTER — Observation Stay
Admission: EM | Admit: 2016-10-14 | Discharge: 2016-10-17 | Disposition: A | Discharge status: Skilled Nursing Facility | Payer: Medicare Other | Attending: Internal Medicine | Admitting: Internal Medicine

## 2016-10-14 VITALS — BP 134/80 | HR 69 | Temp 98.5°F | Wt 287.1 lb

## 2016-10-14 DIAGNOSIS — I2510 Atherosclerotic heart disease of native coronary artery without angina pectoris: Secondary | ICD-10-CM | POA: Diagnosis not present

## 2016-10-14 DIAGNOSIS — E871 Hypo-osmolality and hyponatremia: Secondary | ICD-10-CM | POA: Insufficient documentation

## 2016-10-14 DIAGNOSIS — Z79899 Other long term (current) drug therapy: Secondary | ICD-10-CM | POA: Diagnosis not present

## 2016-10-14 DIAGNOSIS — E1140 Type 2 diabetes mellitus with diabetic neuropathy, unspecified: Secondary | ICD-10-CM | POA: Diagnosis not present

## 2016-10-14 DIAGNOSIS — Z888 Allergy status to other drugs, medicaments and biological substances status: Secondary | ICD-10-CM | POA: Insufficient documentation

## 2016-10-14 DIAGNOSIS — E1122 Type 2 diabetes mellitus with diabetic chronic kidney disease: Secondary | ICD-10-CM | POA: Diagnosis not present

## 2016-10-14 DIAGNOSIS — Z7982 Long term (current) use of aspirin: Secondary | ICD-10-CM | POA: Diagnosis not present

## 2016-10-14 DIAGNOSIS — R531 Weakness: Secondary | ICD-10-CM

## 2016-10-14 DIAGNOSIS — Z6836 Body mass index (BMI) 36.0-36.9, adult: Secondary | ICD-10-CM | POA: Diagnosis not present

## 2016-10-14 DIAGNOSIS — E11319 Type 2 diabetes mellitus with unspecified diabetic retinopathy without macular edema: Secondary | ICD-10-CM | POA: Diagnosis not present

## 2016-10-14 DIAGNOSIS — Z794 Long term (current) use of insulin: Secondary | ICD-10-CM | POA: Diagnosis not present

## 2016-10-14 DIAGNOSIS — M25531 Pain in right wrist: Secondary | ICD-10-CM | POA: Diagnosis not present

## 2016-10-14 DIAGNOSIS — E860 Dehydration: Secondary | ICD-10-CM | POA: Diagnosis present

## 2016-10-14 DIAGNOSIS — Z89421 Acquired absence of other right toe(s): Secondary | ICD-10-CM | POA: Insufficient documentation

## 2016-10-14 DIAGNOSIS — N183 Chronic kidney disease, stage 3 (moderate): Secondary | ICD-10-CM | POA: Diagnosis not present

## 2016-10-14 DIAGNOSIS — D72829 Elevated white blood cell count, unspecified: Secondary | ICD-10-CM

## 2016-10-14 DIAGNOSIS — Z87891 Personal history of nicotine dependence: Secondary | ICD-10-CM | POA: Insufficient documentation

## 2016-10-14 DIAGNOSIS — I255 Ischemic cardiomyopathy: Secondary | ICD-10-CM | POA: Insufficient documentation

## 2016-10-14 DIAGNOSIS — R112 Nausea with vomiting, unspecified: Secondary | ICD-10-CM

## 2016-10-14 DIAGNOSIS — R197 Diarrhea, unspecified: Secondary | ICD-10-CM | POA: Diagnosis not present

## 2016-10-14 DIAGNOSIS — Z7902 Long term (current) use of antithrombotics/antiplatelets: Secondary | ICD-10-CM | POA: Diagnosis not present

## 2016-10-14 DIAGNOSIS — Z85038 Personal history of other malignant neoplasm of large intestine: Secondary | ICD-10-CM | POA: Insufficient documentation

## 2016-10-14 DIAGNOSIS — Z85030 Personal history of malignant carcinoid tumor of large intestine: Secondary | ICD-10-CM | POA: Insufficient documentation

## 2016-10-14 DIAGNOSIS — K529 Noninfective gastroenteritis and colitis, unspecified: Principal | ICD-10-CM | POA: Insufficient documentation

## 2016-10-14 DIAGNOSIS — E785 Hyperlipidemia, unspecified: Secondary | ICD-10-CM | POA: Diagnosis not present

## 2016-10-14 DIAGNOSIS — N179 Acute kidney failure, unspecified: Secondary | ICD-10-CM | POA: Insufficient documentation

## 2016-10-14 DIAGNOSIS — Z8673 Personal history of transient ischemic attack (TIA), and cerebral infarction without residual deficits: Secondary | ICD-10-CM | POA: Diagnosis not present

## 2016-10-14 DIAGNOSIS — I129 Hypertensive chronic kidney disease with stage 1 through stage 4 chronic kidney disease, or unspecified chronic kidney disease: Secondary | ICD-10-CM | POA: Diagnosis not present

## 2016-10-14 LAB — CBC
HCT: 29.2 % — ABNORMAL LOW (ref 40.0–52.0)
HEMOGLOBIN: 9.6 g/dL — AB (ref 13.0–18.0)
MCH: 29.9 pg (ref 26.0–34.0)
MCHC: 32.8 g/dL (ref 32.0–36.0)
MCV: 91.4 fL (ref 80.0–100.0)
Platelets: 250 10*3/uL (ref 150–440)
RBC: 3.2 MIL/uL — ABNORMAL LOW (ref 4.40–5.90)
RDW: 13.1 % (ref 11.5–14.5)
WBC: 17.1 10*3/uL — AB (ref 3.8–10.6)

## 2016-10-14 LAB — LACTIC ACID, PLASMA: Lactic Acid, Venous: 1.1 mmol/L (ref 0.5–1.9)

## 2016-10-14 LAB — BASIC METABOLIC PANEL
ANION GAP: 6 (ref 5–15)
BUN: 47 mg/dL — ABNORMAL HIGH (ref 6–20)
CALCIUM: 8.5 mg/dL — AB (ref 8.9–10.3)
CO2: 27 mmol/L (ref 22–32)
Chloride: 100 mmol/L — ABNORMAL LOW (ref 101–111)
Creatinine, Ser: 2.22 mg/dL — ABNORMAL HIGH (ref 0.61–1.24)
GFR calc Af Amer: 32 mL/min — ABNORMAL LOW (ref >60)
GFR, EST NON AFRICAN AMERICAN: 27 mL/min — AB (ref >60)
GLUCOSE: 158 mg/dL — AB (ref 65–99)
POTASSIUM: 3.8 mmol/L (ref 3.5–5.1)
SODIUM: 133 mmol/L — AB (ref 135–145)

## 2016-10-14 LAB — MAGNESIUM: MAGNESIUM: 2.2 mg/dL (ref 1.7–2.4)

## 2016-10-14 LAB — MRSA PCR SCREENING: MRSA BY PCR: NEGATIVE

## 2016-10-14 LAB — GLUCOSE, CAPILLARY
GLUCOSE-CAPILLARY: 88 mg/dL (ref 65–99)
GLUCOSE-CAPILLARY: 127 mg/dL — AB (ref 65–99)

## 2016-10-14 MED ORDER — HYDRALAZINE HCL 25 MG PO TABS
25.0000 mg | ORAL_TABLET | Freq: Three times a day (TID) | ORAL | Status: DC
  Administered 2016-10-14 – 2016-10-17 (×10): 25 mg via ORAL
  Filled 2016-10-14 (×10): qty 1

## 2016-10-14 MED ORDER — ONDANSETRON HCL 4 MG/2ML IJ SOLN: 4.0000 mg | Freq: Four times a day (QID) | INTRAMUSCULAR | Status: DC | PRN

## 2016-10-14 MED ORDER — INSULIN ASPART 100 UNIT/ML ~~LOC~~ SOLN: 0.0000 [IU] | Freq: Every day | SUBCUTANEOUS | Status: DC

## 2016-10-14 MED ORDER — TAMSULOSIN HCL 0.4 MG PO CAPS
0.4000 mg | ORAL_CAPSULE | Freq: Every day | ORAL | Status: DC
  Administered 2016-10-15 – 2016-10-17 (×3): 0.4 mg via ORAL
  Filled 2016-10-14 (×3): qty 1

## 2016-10-14 MED ORDER — ALBUTEROL SULFATE (2.5 MG/3ML) 0.083% IN NEBU: 2.5000 mg | INHALATION_SOLUTION | RESPIRATORY_TRACT | Status: DC | PRN

## 2016-10-14 MED ORDER — ACETAMINOPHEN 650 MG RE SUPP: 650.0000 mg | Freq: Four times a day (QID) | RECTAL | Status: DC | PRN

## 2016-10-14 MED ORDER — HEPARIN SODIUM (PORCINE) 5000 UNIT/ML IJ SOLN
5000.0000 [IU] | Freq: Three times a day (TID) | INTRAMUSCULAR | Status: DC
  Administered 2016-10-15 – 2016-10-17 (×9): 5000 [IU] via SUBCUTANEOUS
  Filled 2016-10-14 (×9): qty 1

## 2016-10-14 MED ORDER — ONDANSETRON HCL 4 MG PO TABS
4.0000 mg | ORAL_TABLET | Freq: Four times a day (QID) | ORAL | Status: DC | PRN
Start: 2016-10-14 — End: 2016-10-17

## 2016-10-14 MED ORDER — ASPIRIN 81 MG PO CHEW
81.0000 mg | CHEWABLE_TABLET | Freq: Every day | ORAL | Status: DC
  Administered 2016-10-15 – 2016-10-17 (×3): 81 mg via ORAL
  Filled 2016-10-14 (×3): qty 1

## 2016-10-14 MED ORDER — CARVEDILOL 25 MG PO TABS
25.0000 mg | ORAL_TABLET | Freq: Two times a day (BID) | ORAL | Status: DC
  Administered 2016-10-14 – 2016-10-17 (×7): 25 mg via ORAL
  Filled 2016-10-14 (×7): qty 1

## 2016-10-14 MED ORDER — METOCLOPRAMIDE HCL 5 MG/ML IJ SOLN
10.0000 mg | Freq: Once | INTRAMUSCULAR | Status: AC
Start: 2016-10-14 — End: 2016-10-14
  Administered 2016-10-14: 10 mg via INTRAVENOUS
  Filled 2016-10-14: qty 2

## 2016-10-14 MED ORDER — HYDROCODONE-ACETAMINOPHEN 5-325 MG PO TABS
1.0000 | ORAL_TABLET | ORAL | Status: DC | PRN
  Administered 2016-10-16: 2 via ORAL
  Administered 2016-10-16: 1 via ORAL
  Filled 2016-10-14 (×2): qty 1
  Filled 2016-10-14: qty 2
  Filled 2016-10-14: qty 1

## 2016-10-14 MED ORDER — CLOPIDOGREL BISULFATE 75 MG PO TABS
75.0000 mg | ORAL_TABLET | Freq: Every day | ORAL | Status: DC
  Administered 2016-10-15 – 2016-10-17 (×3): 75 mg via ORAL
  Filled 2016-10-14 (×3): qty 1

## 2016-10-14 MED ORDER — ACETAMINOPHEN 325 MG PO TABS
650.0000 mg | ORAL_TABLET | Freq: Four times a day (QID) | ORAL | Status: DC | PRN
  Administered 2016-10-15 – 2016-10-17 (×3): 650 mg via ORAL
  Filled 2016-10-14 (×4): qty 2

## 2016-10-14 MED ORDER — SENNOSIDES-DOCUSATE SODIUM 8.6-50 MG PO TABS: 1.0000 | ORAL_TABLET | Freq: Every evening | ORAL | Status: DC | PRN

## 2016-10-14 MED ORDER — SODIUM CHLORIDE 0.9 % IV SOLN
INTRAVENOUS | Status: DC
  Administered 2016-10-14 – 2016-10-16 (×4): via INTRAVENOUS

## 2016-10-14 MED ORDER — AMLODIPINE BESYLATE 5 MG PO TABS
5.0000 mg | ORAL_TABLET | Freq: Every day | ORAL | Status: DC
  Administered 2016-10-15 – 2016-10-17 (×3): 5 mg via ORAL
  Filled 2016-10-14 (×3): qty 1

## 2016-10-14 MED ORDER — ROSUVASTATIN CALCIUM 10 MG PO TABS
20.0000 mg | ORAL_TABLET | Freq: Every evening | ORAL | Status: DC
  Administered 2016-10-14 – 2016-10-17 (×4): 20 mg via ORAL
  Filled 2016-10-14 (×4): qty 2

## 2016-10-14 MED ORDER — INSULIN ASPART 100 UNIT/ML ~~LOC~~ SOLN
0.0000 [IU] | Freq: Three times a day (TID) | SUBCUTANEOUS | Status: DC
Start: 2016-10-14 — End: 2016-10-17
  Administered 2016-10-15: 2 [IU] via SUBCUTANEOUS
  Administered 2016-10-16 – 2016-10-17 (×4): 1 [IU] via SUBCUTANEOUS
  Filled 2016-10-14: qty 2
  Filled 2016-10-14 (×4): qty 1

## 2016-10-14 NOTE — ED Provider Notes (Signed)
[facility] Emergency Department Provider Note    None    (approximate)  I have reviewed the triage vital signs and the nursing notes.   HISTORY  Chief Complaint Weakness    HPI Jacob Buck is a 76 y.o. male history of CK D, hypertension, recent stroke with placement in rehabilitation at [facility] presents to the ER after being seen yesterday with chief complaint of weakness and nausea vomiting and dehydration with diarrhea. Patient was given IV fluids yesterday. Had CT imaging which showed no evidence of acute process. He was given anti-medics and was tolerating oral hydration. He remains hemodynamically stable and at that point he was discharged back to [facility]. He subsequently had blood with PCP this morning where he was told that he was dehydrated and "never should've been sent home."  He was then sent back to the ER. Patient is hemodynamic stable states that he has had some nausea but was only given by mouth medications at [facility]. They did not give any IV fluids. No fevers at home. No chest pain. Has been having watery diarrhea.   Past Medical History:  Diagnosis Date  . Arthritis 2008  . Cancer [facility]) 1991   Colon; right kidney ?  . Cardiomyopathy ([place])    a. ? isch vs nicm;  b. 05/2015 Echo: EF 35-40%, mild MR, mildly dil LA, poor acoustic windows.  . CKD (chronic kidney disease), stage III   . Diverticulosis   . DM type 2 (diabetes mellitus, type 2) ([place]) 2008  . Hyperlipidemia   . Hypertension   . Kidney stone   . Stroke [facility])    a. 05/2015 MRI: acute to early subacute R thalamic infarct, chronic L thalamic infarct, late subacute to chronic right occipital infarct;  b. 05/2015 Carotid U/S: RICA 9-38%H, LICA 8-29%H, patent vertebrals;  b. 06/2015 Event Monitor: RSR, rare PVC's.   Family History  Problem Relation Age of Onset  . Heart disease Father     mild MI  . Diabetes Sister   . Alzheimer's disease Sister   . Diabetes  Brother   . Alzheimer's disease Brother    Past Surgical History:  Procedure Laterality Date  . COLON SURGERY  2008   carcinoid  . COLONOSCOPY  2003, 2014  . CYSTOSCOPY WITH DIRECT VISION INTERNAL URETHROTOMY N/A 04/28/2015   Procedure: CYSTOSCOPY WITH DIRECT VISION INTERNAL URETHROTOMY WITH HOLMIUM LASER ;  Surgeon: Royston Cowper, MD;  Location: [facility] ORS;  Service: Urology;  Laterality: N/A;  . FOOT SURGERY Right 2013   Patient Active Problem List   Diagnosis Date Noted  . Nausea vomiting and diarrhea 10/14/2016  . Right wrist pain 10/14/2016  . Aortic atherosclerosis ([place]) 04/27/2016  . Coronary artery disease involving native heart without angina pectoris 04/27/2016  . Mild obstructive sleep apnea 08/02/2015  . Stroke ([place]) 07/21/2015  . Morbid obesity ([place]) 07/21/2015  . Cardiomyopathy, ischemic 06/12/2015  . Anemia 05/31/2015  . Essential hypertension 05/31/2015  . Hyperlipidemia 05/31/2015  . Non-compliant behavior 12/09/2014  . Type 2 diabetes, uncontrolled, with renal manifestation ([place]) 06/05/2014  . CKD stage 3 due to type 2 diabetes mellitus ([place]) 06/05/2014  . Diabetic retinopathy ([place]) 06/05/2014  . Diabetic neuropathy ([place]) 06/05/2014  . Status post amputation of lesser toe of right foot ([place]) 06/05/2014  . History of colon cancer 06/05/2014      Prior to Admission medications   Medication Sig Start Date End Date Taking? Authorizing Provider  amLODipine (NORVASC)  5 MG tablet Take 1 tablet (5 mg total) by mouth daily. 10/08/16   Demetrios Loll, MD  aspirin 81 MG chewable tablet Chew 1 tablet (81 mg total) by mouth daily. 10/08/16   Demetrios Loll, MD  carvedilol (COREG) 25 MG tablet Take 1 tablet (25 mg total) by mouth 2 (two) times daily with a meal. 04/27/16   Minna Merritts, MD  clopidogrel (PLAVIX) 75 MG tablet Take 1 tablet (75 mg total) by mouth daily. 10/08/16   Demetrios Loll, MD  docusate sodium (COLACE) 100 MG capsule Take 2 capsules (200 mg total) by mouth 2 (two)  times daily. 04/28/15   Royston Cowper, MD  hydrALAZINE (APRESOLINE) 25 MG tablet Take 1 tablet (25 mg total) by mouth every 8 (eight) hours. 10/08/16   Demetrios Loll, MD  Insulin Detemir (LEVEMIR FLEXTOUCH) 100 UNIT/ML Pen Inject 22 Units into the skin 2 (two) times daily. 12/28/15   Coral Spikes, DO  Insulin Pen Needle (BD PEN NEEDLE NANO U/F) 32G X 4 MM MISC Use for insulin administration three times daily Patient taking differently: Use for insulin administration two times daily 06/05/14   Radhika P Phadke, MD  loperamide (IMODIUM A-D) 2 MG tablet Take 1 tablet (2 mg total) by mouth 4 (four) times daily as needed for diarrhea or loose stools. 10/13/16   Anne-Caroline Mariea Clonts, MD  ondansetron (ZOFRAN ODT) 4 MG disintegrating tablet Take 1 tablet (4 mg total) by mouth every 8 (eight) hours as needed for nausea or vomiting. 10/13/16   Eula Listen, MD  promethazine (PHENERGAN) 25 MG/ML injection Inject 12.5 mg into the vein once. 10/13/16 10/13/16  Historical Provider, MD  rosuvastatin (CRESTOR) 20 MG tablet Take 1 tablet (20 mg total) by mouth daily. Patient taking differently: Take 20 mg by mouth every evening.  12/29/15   Coral Spikes, DO  tamsulosin (FLOMAX) 0.4 MG CAPS capsule Take 0.4 mg by mouth daily.    Historical Provider, MD    Allergies Toujeo solostar [insulin glargine]    Social History Social History  Substance Use Topics  . Smoking status: Former Research scientist (life sciences)  . Smokeless tobacco: Never Used  . Alcohol use No    Review of Systems Patient denies headaches, rhinorrhea, blurry vision, numbness, shortness of breath, chest pain, edema, cough, abdominal pain, nausea, vomiting, diarrhea, dysuria, fevers, rashes or hallucinations unless otherwise stated above in HPI. ____________________________________________   PHYSICAL EXAM:  VITAL SIGNS: Vitals:   10/14/16 1018  BP: (!) 153/92  Pulse: 66  Resp: 16  Temp: 98.4 F (36.9 C)    Constitutional: Alert and oriented.  in no acute  distress. Eyes: Conjunctivae are normal. PERRL. EOMI. Head: Atraumatic. Nose: No congestion/rhinnorhea. Mouth/Throat: Mucous membranes are moist.  Oropharynx non-erythematous. Neck: No stridor. Painless ROM. No cervical spine tenderness to palpation Hematological/Lymphatic/Immunilogical: No cervical lymphadenopathy. Cardiovascular: Normal rate, regular rhythm. Grossly normal heart sounds.  Good peripheral circulation. Respiratory: Normal respiratory effort.  No retractions. Lungs CTAB. Gastrointestinal: Soft and nontender. No distention. No abdominal bruits. No CVA tenderness. Musculoskeletal: No lower extremity tenderness nor edema.  No joint effusions. Neurologic:  Normal speech and language. No gross focal neurologic deficits are appreciated. No gait instability. Skin:  Skin is warm, dry and intact. No rash noted. Psychiatric: Mood and affect are normal. Speech and behavior are normal.  ____________________________________________   LABS (all labs ordered are listed, but only abnormal results are displayed)  Results for orders placed or performed during the hospital encounter of 10/14/16 (from the past  24 hour(s))  Basic metabolic panel     Status: Abnormal   Collection Time: 10/14/16 10:20 AM  Result Value Ref Range   Sodium 133 (L) 135 - 145 mmol/L   Potassium 3.8 3.5 - 5.1 mmol/L   Chloride 100 (L) 101 - 111 mmol/L   CO2 27 22 - 32 mmol/L   Glucose, Bld 158 (H) 65 - 99 mg/dL   BUN 47 (H) 6 - 20 mg/dL   Creatinine, Ser 2.22 (H) 0.61 - 1.24 mg/dL   Calcium 8.5 (L) 8.9 - 10.3 mg/dL   GFR calc non Af Amer 27 (L) >60 mL/min   GFR calc Af Amer 32 (L) >60 mL/min   Anion gap 6 5 - 15  CBC     Status: Abnormal   Collection Time: 10/14/16 10:20 AM  Result Value Ref Range   WBC 17.1 (H) 3.8 - 10.6 K/uL   RBC 3.20 (L) 4.40 - 5.90 MIL/uL   Hemoglobin 9.6 (L) 13.0 - 18.0 g/dL   HCT 29.2 (L) 40.0 - 52.0 %   MCV 91.4 80.0 - 100.0 fL   MCH 29.9 26.0 - 34.0 pg   MCHC 32.8 32.0 - 36.0  g/dL   RDW 13.1 11.5 - 14.5 %   Platelets 250 150 - 440 K/uL   ____________________________________________  EKG My review and personal interpretation at Time: 10:33   Indication: weakness  Rate: 60  Rhythm: sinus Axis: normal Other: wandering baseline, normal intervals, no STEMI ____________________________________________  RADIOLOGY  ____________________________________________   PROCEDURES  Procedure(s) performed:  Procedures    Critical Care performed: no ____________________________________________   INITIAL IMPRESSION / ASSESSMENT AND PLAN / ED COURSE  Pertinent labs & imaging results that were available during my care of the patient were reviewed by me and considered in my medical decision making (see chart for details).  DDX: dehydration, enteritis, aki, electrolye abn, ileus,   Jacob Buck is a 76 y.o. who presents to the ED with nausea vomiting and weakness as described above. Patient with multiple visits to health care providers in the past 24 hours. Blood work is otherwise reassuring with exception of mildly elevated BUN and chronic kidney disease. Also has an elevating leukocytosis. He is afebrile with no tachycardia therefore do not feel this is consistent with sepsis but will send for stool cultures. CT imaging performed yesterday shows no acute process. Do suspect some component of viral enteritis. Will continue IV fluids and anti-emetics. Based on his dehydration nausea and vomiting failing outpatient management to fill patient will cry or admission for further evaluation and management.  The patient will be placed on continuous pulse oximetry and telemetry for monitoring.  Laboratory evaluation will be sent to evaluate for the above complaints.         ____________________________________________   FINAL CLINICAL IMPRESSION(S) / ED DIAGNOSES  Final diagnoses:  Dehydration  Weakness  Nausea vomiting and diarrhea  Leukocytosis, unspecified type       NEW MEDICATIONS STARTED DURING THIS VISIT:  New Prescriptions   No medications on file     Note:  This document was prepared using Dragon voice recognition software and may include unintentional dictation errors.    Merlyn Lot, MD 10/14/16 [phone]

## 2016-10-14 NOTE — Progress Notes (Signed)
LCSW consulted with EDP and Care manager- Patient is being admitted.  Joseph from [facility] was informed by family that patient is being admitted and he/Tammy was informed  to HOLD patient bed.   The family in the process of transferring patient to [place] if bed available at [facility]/ if no bed available he is to return to [facility]. FL2 and Passr number has already been faxed to [facility] by SW at [employer].  [employer] LCSW [phone]

## 2016-10-14 NOTE — ED Triage Notes (Signed)
Pt came to ED via EMS from doctor's office. Pt currently at [facility] for rehab from recent stroke. Reports right sided deficits from stroke. Pt came to ED yesterday for n/v and weakness. Was discharged and followed up with PCP today. PCP sent pt back over to ED. Pt reports increased weakness. Normally able to walk with cane, could barely get up on his own today. VS stable.

## 2016-10-14 NOTE — Progress Notes (Signed)
Pre visit review using our clinic review tool, if applicable. No additional management support is needed unless otherwise documented below in the visit note. 

## 2016-10-14 NOTE — Care Management (Signed)
RNCM consult placed due to patient being from facility.  Patient is from [facility].  CSW aware.  RNCM signing off.  Please re consult if appropriate.

## 2016-10-14 NOTE — Progress Notes (Signed)
Subjective:  Patient ID: Jacob Buck, male    DOB: [birth year]  Age: 76 y.o. MRN: [id number]  CC: N/V/D, wrist pain  HPI:  76 year old male with an extensive past medical history including type 2 diabetes, hypertension, hyperlipidemia, CAD, CKD, recent stroke presents with the above complaints.  Patient was recently admitted for a acute ischemic stroke. He was discharged to a SNF. He has since been seen twice in the ED. Most recently he was seen yesterday. He presented with nausea, vomiting, and diarrhea. Labs revealed hyponatremia and a bump in his creatinine. Worsening of his anemia was also noted. CT scan was negative. He was treated with antiemetics and IV fluids and reportedly had improvement. He was discharged back to his skilled nursing facility.  Patient presents today endorsing continued nausea, vomiting, diarrhea. He states that he's been unable to keep down much. Additionally he continues to have diarrhea. He feels poorly and is weak. Family states that he's been unable to purchase patient in physical therapy due to this as well as right wrist pain. Patient reports that he's had ongoing right wrist pain. He attributes it to arthritis. Decreased range of motion. Pain is moderate in severity. No recent fall, trauma, injury.  Social Hx   Social History   Social History  . Marital status: Married    Spouse name: N/A  . Number of children: N/A  . Years of education: N/A   Social History Main Topics  . Smoking status: Former Research scientist (life sciences)  . Smokeless tobacco: Never Used  . Alcohol use No  . Drug use: No  . Sexual activity: No   Other Topics Concern  . None   Social History Narrative   Retired in 2006   Lives at home with wife   2 children daughters   Enjoys- fishing    Left handed    Pets- 2 inside dogs   Review of Systems  Constitutional: Positive for fatigue.  Gastrointestinal: Positive for diarrhea, nausea and vomiting.  Musculoskeletal:       Right wrist pain.    Neurological: Positive for weakness.   Objective:  BP 134/80   Pulse 69   Temp 98.5 F (36.9 C) (Oral)   Wt 287 lb 2 oz (130.2 kg) Comment: pt in wheelchair and unable to stand.  SpO2 95%   BMI 36.86 kg/m   BP/Weight 10/14/2016 7/65/4650 09/06/4654  Systolic BP 812 751 700  Diastolic BP 80 78 93  Wt. (Lbs) 287.13 246 -  BMI 36.86 31.58 -   Physical Exam  Constitutional:  Appears fatigued/weak.  HENT:  Dry mucous membranes.  Cardiovascular: Normal rate and regular rhythm.   Pulmonary/Chest: He has no wheezes. He has no rales.  Abdominal: Soft. He exhibits no distension.  Neurological:  Alert but fatigued (does not appear well).   Psychiatric:  Flat affect.  Vitals reviewed.  Lab Results  Component Value Date   WBC 14.1 (H) 10/13/2016   HGB 9.9 (L) 10/13/2016   HCT 30.1 (L) 10/13/2016   PLT 220 10/13/2016   GLUCOSE 218 (H) 10/13/2016   CHOL 109 10/07/2016   TRIG 191 (H) 10/07/2016   HDL 31 (L) 10/07/2016   LDLDIRECT 66.0 12/29/2015   LDLCALC 40 10/07/2016   ALT 81 (H) 10/13/2016   AST 74 (H) 10/13/2016   NA 131 (L) 10/13/2016   K 4.0 10/13/2016   CL 98 (L) 10/13/2016   CREATININE 2.74 (H) 10/13/2016   BUN 54 (H) 10/13/2016   CO2 22 10/13/2016  TSH 2.044 05/31/2015   INR 0.98 10/06/2016   HGBA1C 7.8 (H) 10/07/2016   MICROALBUR 223.2 (H) 12/29/2015   Ct Abdomen Pelvis Wo Contrast  Result Date: 10/13/2016 CLINICAL DATA:  Vomiting. EXAM: CT ABDOMEN AND PELVIS WITHOUT CONTRAST TECHNIQUE: Multidetector CT imaging of the abdomen and pelvis was performed following the standard protocol without IV contrast. COMPARISON:  04/09/2016 FINDINGS: Lower chest: Trace bilateral pleural effusions. Heart is borderline enlarged. Diffuse coronary artery calcifications. Scattered aortic calcifications. Nodular density posteriorly in the right lower lobe which was not present on prior study, measuring 14 mm. Hepatobiliary: No focal hepatic abnormality. Gallbladder unremarkable.  Pancreas: No focal abnormality or ductal dilatation. Spleen: No focal abnormality.  Normal size. Adrenals/Urinary Tract: Scattered bilateral renal stones, nonobstructing. There appear to be small layering stones in the non dilated renal pelves bilaterally. No hydronephrosis. Stone noted posteriorly in the right posterior bladder, likely recently passed stone. No visible ureteral stones. Adrenal glands unremarkable. Stomach/Bowel: Descending colonic and sigmoid diverticulosis. No active diverticulitis. Mild gaseous distention of the stomach. Small bowel decompressed. Vascular/Lymphatic: Diffuse aortic and iliac calcifications. No aneurysm or adenopathy. Reproductive: No focal abnormality. Other: No free fluid or free air. Musculoskeletal: No acute bony abnormality. IMPRESSION: Bilateral nephrolithiasis. Small punctate layering nonobstructing stones in the renal pelves bilaterally. No ureteral stones or hydronephrosis. There is a small layering stone within the urinary bladder, likely recently passed stone. Trace bilateral pleural effusions.  Mild cardiomegaly. Coronary artery disease, aortic atherosclerosis. Left colonic diverticulosis.  No active diverticulitis. Electronically Signed   By: Rolm Baptise M.D.   On: 10/13/2016 10:32   Dg Chest 2 View  Result Date: 10/13/2016 CLINICAL DATA:  Nausea, vomiting last night EXAM: CHEST  2 VIEW COMPARISON:  Chest x-ray of 10/06/2016 FINDINGS: No active infiltrate or effusion is seen. The lungs are not optimally aerated. The heart is within normal limits in size. No bony abnormality is seen. IMPRESSION: No active cardiopulmonary disease. Electronically Signed   By: Ivar Drape M.D.   On: 10/13/2016 08:18   Dg Chest 2 View  Result Date: 10/06/2016 CLINICAL DATA:  Bilateral leg weakness for 2 days, slurred speech. Hypertensive. History of cancer and hypertension. EXAM: CHEST  2 VIEW COMPARISON:  Chest radiograph May 30, 2015 FINDINGS: Cardiac silhouette is upper  limits of normal in size. Mediastinal silhouette is nonsuspicious. Mildly calcified aortic knob. Mildly elevated RIGHT hemidiaphragm is unchanged. No pleural effusion or focal consolidation. No pneumothorax. Mild degenerative change of the thoracic spine. IMPRESSION: Stable examination: Borderline cardiomegaly without acute pulmonary process. Electronically Signed   By: Elon Alas M.D.   On: 10/06/2016 18:19   Ct Head Wo Contrast  Result Date: 10/09/2016 CLINICAL DATA:  Left-sided weakness and aphasia. History of stroke and currently undergoing speech therapy for slurred speech. Patient recently discharged from hospital after stroke. EXAM: CT HEAD WITHOUT CONTRAST TECHNIQUE: Contiguous axial images were obtained from the base of the skull through the vertex without intravenous contrast. COMPARISON:  Brain MRI dated 10/07/2016. FINDINGS: Brain: Edema within the right posteromedial temporal lobe and right occipital lobe, compatible with expected evolution of the acute infarct described on brain MRI of 10/07/2016. The extent appears stable compared to the earlier MRI. Again noted is local mass effect with associated sulcal effacement and effacement of the posterior horn of the right lateral ventricle. Additional small old lacunar infarcts are noted within each thalamus. Mild chronic small vessel ischemic changes again noted within the bilateral periventricular white matter regions. No parenchymal or extra-axial hemorrhage. Ventricles  are stable in size and configuration. Vascular: There are chronic calcified atherosclerotic changes of the large vessels at the skull base. No unexpected hyperdense vessel. Skull: Normal. Negative for fracture or focal lesion. Sinuses/Orbits: No acute finding. Other: None. IMPRESSION: 1. Edema within the right posteromedial temporal lobe and right occipital lobe, compatible with expected evolution of the infarction described on brain MRI of 10/07/2016, stable in extent with local  mass effect. 2. No new findings.  No intracranial hemorrhage. 3. Additional chronic small vessel ischemic changes within the periventricular white matter and bilateral thalami. Electronically Signed   By: Franki Cabot M.D.   On: 10/09/2016 12:54   Ct Head Wo Contrast  Result Date: 10/06/2016 CLINICAL DATA:  Bilateral leg weakness and slurred speech for 2 days. EXAM: CT HEAD WITHOUT CONTRAST TECHNIQUE: Contiguous axial images were obtained from the base of the skull through the vertex without intravenous contrast. COMPARISON:  Brain MRI 05/30/2015.  Head CT 05/30/2015. FINDINGS: Brain: Low-attenuation in the medial right temporal lobe, parietal lobe and occipital lobe is compatible with acute to subacute ischemia in a right PCA territory, superimposed on posterior chronic PCA infarct seen on previous imaging. No evidence for acute hemorrhage. No mass-effect or mass lesion. No abnormal extra-axial fluid collection. Diffuse loss of parenchymal volume is consistent with atrophy. Patchy low attenuation in the deep hemispheric and periventricular white matter is nonspecific, but likely reflects chronic microvascular ischemic demyelination. Vascular: Atherosclerotic calcification is visualized in the intracranial segments of the internal carotid arteries. No dense MCA sign. Major dural sinuses are unremarkable. Skull: No evidence for fracture. No worrisome lytic or sclerotic lesion. Sinuses/Orbits: The visualized paranasal sinuses and mastoid air cells are clear. Visualized portions of the globes and intraorbital fat are unremarkable. Other: None. IMPRESSION: Acute to subacute infarct involving the right PCA distribution in this patient with history of previous PCA territory infarct. No evidence for substantial mass-effect or acute hemorrhage. Electronically Signed   By: Misty Stanley M.D.   On: 10/06/2016 14:53   Mr Brain Wo Contrast  Result Date: 10/07/2016 CLINICAL DATA:  Stroke.  Hypertension and diabetes EXAM:  MRI HEAD WITHOUT CONTRAST MRA HEAD WITHOUT CONTRAST TECHNIQUE: Multiplanar, multiecho pulse sequences of the brain and surrounding structures were obtained without intravenous contrast. Angiographic images of the head were obtained using MRA technique without contrast. COMPARISON:  CT head of 10/06/2016 FINDINGS: MRI HEAD FINDINGS Brain: Acute infarct in the right posterior cerebral artery territory. This involves the posteromedial temporal lobe and right occipital lobe. Small acute infarcts also in the right thalamus. Chronic microvascular ischemic change in the white matter. Negative for hydrocephalus. Cerebral volume normal for age. Negative for hemorrhage or mass. Vascular: Normal arterial flow voids. Skull and upper cervical spine: Negative Sinuses/Orbits: Negative Other: None MRA HEAD FINDINGS Both vertebral arteries patent to the basilar. Basilar widely patent. Left PICA patent. Right PICA not visualized however there is a large right AICA which likely supplies this territory. Occlusion of the proximal right posterior cerebral artery compatible with acute infarction. Moderate stenosis of the left posterior cerebral artery in the midportion. Atherosclerotic disease in the precavernous and cavernous right carotid. Critical stenosis of the terminal right internal carotid artery extending into the right M1 segment which is severely narrowed. Right A1 segment is occluded. Severe stenosis of the right MCA bifurcation. Left internal carotid artery patent. Left posterior communicating artery patent. Left anterior and middle cerebral arteries are patent. Right anterior cerebral artery supplied from the left. IMPRESSION: Acute infarct right PCA territory.  The right posterior cerebral artery is occluded proximally. 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 The right A1 segment is occluded and right M1 segment severely stenotic. Severe stenosis right MCA  bifurcation. Electronically Signed   By: Franchot Gallo M.D.   On: 10/07/2016 10:26   US Carotid Bilateral (at [facility] Only)  Result Date: 10/07/2016 CLINICAL DATA:  Slurred speech, hypertension, diabetes, former smoker EXAM: BILATERAL CAROTID DUPLEX ULTRASOUND TECHNIQUE: Pearline Cables scale imaging, color Doppler and duplex ultrasound were performed of bilateral carotid and vertebral arteries in the neck. COMPARISON:  10/07/2016 MRI FINDINGS: Criteria: Quantification of carotid stenosis is based on velocity parameters that correlate the residual internal carotid diameter with NASCET-based stenosis levels, using the diameter of the distal internal carotid lumen as the denominator for stenosis measurement. The following velocity measurements were obtained: RIGHT ICA:  89/13 cm/sec CCA:  18/56 cm/sec SYSTOLIC ICA/CCA RATIO:  0.9 DIASTOLIC ICA/CCA RATIO:  1.0 ECA:  82 cm/sec LEFT ICA:  127/23 cm/sec CCA:  314/97 cm/sec SYSTOLIC ICA/CCA RATIO:  1.2 DIASTOLIC ICA/CCA RATIO:  1.3 ECA:  83 cm/sec RIGHT CAROTID ARTERY: Heterogeneous moderate right carotid bifurcation atherosclerosis with areas of calcification. Despite this, there is no hemodynamically significant right ICA stenosis, velocity elevation, or turbulent flow. RIGHT VERTEBRAL ARTERY:  Antegrade LEFT CAROTID ARTERY: Similar heterogeneous moderate left carotid bifurcation atherosclerosis. Despite this, no hemodynamically significant left ICA stenosis, velocity elevation, or turbulent flow. Degree of narrowing also less than 50%. LEFT VERTEBRAL ARTERY:  Antegrade IMPRESSION: Moderate bilateral carotid atherosclerosis. No hemodynamically significant ICA stenosis. Degree of narrowing less than 50% bilaterally. Patent antegrade vertebral flow bilaterally Electronically Signed   By: Jerilynn Mages.  Shick M.D.   On: 10/07/2016 11:49   Mr Jodene Nam Head/brain [place] Cm  Result Date: 10/07/2016 CLINICAL DATA:  Stroke.  Hypertension and diabetes EXAM: MRI HEAD WITHOUT CONTRAST MRA HEAD WITHOUT  CONTRAST TECHNIQUE: Multiplanar, multiecho pulse sequences of the brain and surrounding structures were obtained without intravenous contrast. Angiographic images of the head were obtained using MRA technique without contrast. COMPARISON:  CT head of 10/06/2016 FINDINGS: MRI HEAD FINDINGS Brain: Acute infarct in the right posterior cerebral artery territory. This involves the posteromedial temporal lobe and right occipital lobe. Small acute infarcts also in the right thalamus. Chronic microvascular ischemic change in the white matter. Negative for hydrocephalus. Cerebral volume normal for age. Negative for hemorrhage or mass. Vascular: Normal arterial flow voids. Skull and upper cervical spine: Negative Sinuses/Orbits: Negative Other: None MRA HEAD FINDINGS Both vertebral arteries patent to the basilar. Basilar widely patent. Left PICA patent. Right PICA not visualized however there is a large right AICA which likely supplies this territory. Occlusion of the proximal right posterior cerebral artery compatible with acute infarction. Moderate stenosis of the left posterior cerebral artery in the midportion. Atherosclerotic disease in the precavernous and cavernous right carotid. Critical stenosis of the terminal right internal carotid artery extending into the right M1 segment which is severely narrowed. Right A1 segment is occluded. Severe stenosis of the right MCA bifurcation. Left internal carotid artery patent. Left posterior communicating artery patent. Left anterior and middle cerebral arteries are patent. Right anterior cerebral artery supplied from the left. IMPRESSION: Acute infarct right PCA territory. The right posterior cerebral artery is occluded proximally. 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 The right A1 segment is occluded and right M1 segment severely stenotic. Severe stenosis right MCA bifurcation. Electronically Signed   By: Juanda Crumble  Carlis Abbott M.D.   On: 10/07/2016 10:26    Assessment & Plan:   Problem List Items Addressed This Visit    Right wrist pain    New problem. Uncertain etiology at this time. Favor underlying osteoarthritis. Obtaining x-ray today prior to going to the ER.      Relevant Orders   DG Wrist Complete Right   Nausea vomiting and diarrhea - Primary    New problem. Patient is clearly dehydrated. Not well appearing. I discussed this with patient and family. Calling EMS for transport to the ER. Recommend admission (at least observation) for IV fluids and antiemetics.         Follow-up: Following hospitalization  [place]

## 2016-10-14 NOTE — H&P (Signed)
[place] at [place] NAME: Jacob Buck    MR#:  [id number]  DATE OF BIRTH:  December 06, 1940  DATE OF ADMISSION:  10/14/2016  PRIMARY CARE PHYSICIAN: Coral Spikes, DO   REQUESTING/REFERRING PHYSICIAN: Merlyn Lot, MD  CHIEF COMPLAINT:   Chief Complaint  Patient presents with  . Weakness   Nausea, vomiting and diarrhea for 3 days, generalized weakness. HISTORY OF PRESENT ILLNESS:  Jacob Buck  is a 76 y.o. male with a known history of Recent CVA, CKD stage III, diverticulosis, kidney stone, hypertension, hyperlipidemia and diabetes. The patient was sent from skilled facility to the ED due to above chief complaints. He complains of intermittent nausea, vomiting and watery diarrhea for 3 days. He came to the ED yesterday and get CAT scan of the abdomen, which didn't show any obstruction. Since he continuously has nausea, vomiting and diarrhea, he came to the ED again. He was found leukocytosis, dehydration and hyponatremia. PAST MEDICAL HISTORY:   Past Medical History:  Diagnosis Date  . Arthritis 2008  . Cancer [facility]) 1991   Colon; right kidney ?  . Cardiomyopathy ([place])    a. ? isch vs nicm;  b. 05/2015 Echo: EF 35-40%, mild MR, mildly dil LA, poor acoustic windows.  . CKD (chronic kidney disease), stage III   . Diverticulosis   . DM type 2 (diabetes mellitus, type 2) ([place]) 2008  . Hyperlipidemia   . Hypertension   . Kidney stone   . Stroke [facility])    a. 05/2015 MRI: acute to early subacute R thalamic infarct, chronic L thalamic infarct, late subacute to chronic right occipital infarct;  b. 05/2015 Carotid U/S: RICA 2-99%M, LICA 4-26%S, patent vertebrals;  b. 06/2015 Event Monitor: RSR, rare PVC's.    PAST SURGICAL HISTORY:   Past Surgical History:  Procedure Laterality Date  . COLON SURGERY  2008   carcinoid  . COLONOSCOPY  2003, 2014  . CYSTOSCOPY WITH DIRECT VISION INTERNAL URETHROTOMY N/A 04/28/2015   Procedure: CYSTOSCOPY  WITH DIRECT VISION INTERNAL URETHROTOMY WITH HOLMIUM LASER ;  Surgeon: Royston Cowper, MD;  Location: [facility] ORS;  Service: Urology;  Laterality: N/A;  . FOOT SURGERY Right 2013    SOCIAL HISTORY:   Social History  Substance Use Topics  . Smoking status: Former Research scientist (life sciences)  . Smokeless tobacco: Never Used  . Alcohol use No    FAMILY HISTORY:   Family History  Problem Relation Age of Onset  . Heart disease Father     mild MI  . Diabetes Sister   . Alzheimer's disease Sister   . Diabetes Brother   . Alzheimer's disease Brother     DRUG ALLERGIES:   Allergies  Allergen Reactions  . Toujeo Solostar [Insulin Glargine] Other (See Comments)    Reaction : Numbness in arm, tingling    REVIEW OF SYSTEMS:   Review of Systems  Constitutional: Positive for malaise/fatigue. Negative for chills and fever.  HENT: Negative for congestion.   Eyes: Negative for blurred vision.  Respiratory: Negative for cough, shortness of breath and wheezing.   Cardiovascular: Negative for chest pain and leg swelling.  Gastrointestinal: Positive for diarrhea, nausea and vomiting. Negative for abdominal pain, blood in stool and melena.  Genitourinary: Negative for dysuria and hematuria.  Musculoskeletal: Negative for back pain.  Skin: Negative for itching and rash.  Neurological: Positive for weakness. Negative for dizziness, focal weakness, seizures, loss of consciousness and headaches.  Psychiatric/Behavioral: Negative for depression. The  patient is not nervous/anxious.     MEDICATIONS AT HOME:   Prior to Admission medications   Medication Sig Start Date End Date Taking? Authorizing Provider  amLODipine (NORVASC) 5 MG tablet Take 1 tablet (5 mg total) by mouth daily. 10/08/16  Yes Demetrios Loll, MD  aspirin 81 MG chewable tablet Chew 1 tablet (81 mg total) by mouth daily. 10/08/16  Yes Demetrios Loll, MD  carvedilol (COREG) 25 MG tablet Take 1 tablet (25 mg total) by mouth 2 (two) times daily with a meal. 04/27/16   Yes Minna Merritts, MD  clopidogrel (PLAVIX) 75 MG tablet Take 1 tablet (75 mg total) by mouth daily. 10/08/16  Yes Demetrios Loll, MD  docusate sodium (COLACE) 100 MG capsule Take 2 capsules (200 mg total) by mouth 2 (two) times daily. 04/28/15  Yes Royston Cowper, MD  hydrALAZINE (APRESOLINE) 25 MG tablet Take 1 tablet (25 mg total) by mouth every 8 (eight) hours. 10/08/16  Yes Demetrios Loll, MD  Insulin Detemir (LEVEMIR FLEXTOUCH) 100 UNIT/ML Pen Inject 22 Units into the skin 2 (two) times daily. 12/28/15  Yes Coral Spikes, DO  rosuvastatin (CRESTOR) 20 MG tablet Take 1 tablet (20 mg total) by mouth daily. Patient taking differently: Take 20 mg by mouth every evening.  12/29/15  Yes Coral Spikes, DO  tamsulosin (FLOMAX) 0.4 MG CAPS capsule Take 0.4 mg by mouth daily.   Yes Historical Provider, MD  Insulin Pen Needle (BD PEN NEEDLE NANO U/F) 32G X 4 MM MISC Use for insulin administration three times daily Patient taking differently: Use for insulin administration two times daily 06/05/14   Radhika P Phadke, MD  loperamide (IMODIUM A-D) 2 MG tablet Take 1 tablet (2 mg total) by mouth 4 (four) times daily as needed for diarrhea or loose stools. 10/13/16   Anne-Caroline Mariea Clonts, MD  ondansetron (ZOFRAN ODT) 4 MG disintegrating tablet Take 1 tablet (4 mg total) by mouth every 8 (eight) hours as needed for nausea or vomiting. 10/13/16   Eula Listen, MD  promethazine (PHENERGAN) 25 MG/ML injection Inject 12.5 mg into the vein once. 10/13/16 10/13/16  Historical Provider, MD      VITAL SIGNS:  Blood pressure (!) 153/92, pulse 66, temperature 98.4 F (36.9 C), temperature source Oral, resp. rate 16, height 6\' 2"  (1.88 m), weight 287 lb (130.2 kg), SpO2 98 %.  PHYSICAL EXAMINATION:  Physical Exam  GENERAL:  76 y.o.-year-old patient lying in the bed with no acute distress.  EYES: Pupils equal, round, reactive to light and accommodation. No scleral icterus. Extraocular muscles intact.  HEENT: Head  atraumatic, normocephalic. Oropharynx and nasopharynx clear.  NECK:  Supple, no jugular venous distention. No thyroid enlargement, no tenderness.  LUNGS: Normal breath sounds bilaterally, no wheezing, rales,rhonchi or crepitation. No use of accessory muscles of respiration.  CARDIOVASCULAR: S1, S2 normal. No murmurs, rubs, or gallops.  ABDOMEN: Soft, nontender, nondistended. Bowel sounds present. No organomegaly or mass.  EXTREMITIES: No pedal edema, cyanosis, or clubbing.  NEUROLOGIC: Cranial nerves II through XII are intact.The patient followed limited commands. PSYCHIATRIC: The patient is alert and oriented x 3. he is very drowsy and sleepy.  SKIN: No obvious rash, lesion, or ulcer.   LABORATORY PANEL:   CBC  Recent Labs Lab 10/14/16 1020  WBC 17.1*  HGB 9.6*  HCT 29.2*  PLT 250   ------------------------------------------------------------------------------------------------------------------  Chemistries   Recent Labs Lab 10/13/16 0730 10/14/16 1020  NA 131* 133*  K 4.0 3.8  CL 98* 100*  CO2 22 27  GLUCOSE 218* 158*  BUN 54* 47*  CREATININE 2.74* 2.22*  CALCIUM 8.6* 8.5*  AST 74*  --   ALT 81*  --   ALKPHOS 68  --   BILITOT 1.8*  --    ------------------------------------------------------------------------------------------------------------------  Cardiac Enzymes  Recent Labs Lab 10/13/16 0730  TROPONINI <0.03   ------------------------------------------------------------------------------------------------------------------  RADIOLOGY:  Ct Abdomen Pelvis Wo Contrast  Result Date: 10/13/2016 CLINICAL DATA:  Vomiting. EXAM: CT ABDOMEN AND PELVIS WITHOUT CONTRAST TECHNIQUE: Multidetector CT imaging of the abdomen and pelvis was performed following the standard protocol without IV contrast. COMPARISON:  04/09/2016 FINDINGS: Lower chest: Trace bilateral pleural effusions. Heart is borderline enlarged. Diffuse coronary artery calcifications. Scattered aortic  calcifications. Nodular density posteriorly in the right lower lobe which was not present on prior study, measuring 14 mm. Hepatobiliary: No focal hepatic abnormality. Gallbladder unremarkable. Pancreas: No focal abnormality or ductal dilatation. Spleen: No focal abnormality.  Normal size. Adrenals/Urinary Tract: Scattered bilateral renal stones, nonobstructing. There appear to be small layering stones in the non dilated renal pelves bilaterally. No hydronephrosis. Stone noted posteriorly in the right posterior bladder, likely recently passed stone. No visible ureteral stones. Adrenal glands unremarkable. Stomach/Bowel: Descending colonic and sigmoid diverticulosis. No active diverticulitis. Mild gaseous distention of the stomach. Small bowel decompressed. Vascular/Lymphatic: Diffuse aortic and iliac calcifications. No aneurysm or adenopathy. Reproductive: No focal abnormality. Other: No free fluid or free air. Musculoskeletal: No acute bony abnormality. IMPRESSION: Bilateral nephrolithiasis. Small punctate layering nonobstructing stones in the renal pelves bilaterally. No ureteral stones or hydronephrosis. There is a small layering stone within the urinary bladder, likely recently passed stone. Trace bilateral pleural effusions.  Mild cardiomegaly. Coronary artery disease, aortic atherosclerosis. Left colonic diverticulosis.  No active diverticulitis. Electronically Signed   By: Rolm Baptise M.D.   On: 10/13/2016 10:32   Dg Chest 2 View  Result Date: 10/13/2016 CLINICAL DATA:  Nausea, vomiting last night EXAM: CHEST  2 VIEW COMPARISON:  Chest x-ray of 10/06/2016 FINDINGS: No active infiltrate or effusion is seen. The lungs are not optimally aerated. The heart is within normal limits in size. No bony abnormality is seen. IMPRESSION: No active cardiopulmonary disease. Electronically Signed   By: Ivar Drape M.D.   On: 10/13/2016 08:18      IMPRESSION AND PLAN:   Nausea vomiting and diarrhea, possible acute  gastroenteritis The patient will be placed for observation. I will start normal saline IV, Zofran when necessary, hold Colace.  Clear liquid diet for now. Advance diet if tolerated.  ARF on CKD stage III due to dehydration. Continue normal saline IV and follow-up BMP.  Hyponatremia. Continue normal saline IV and follow-up BMP. Diabetes. Hold Levemir due to poor oral intake, start sliding scale. Hypertension. Continue hypertension medication. Recent CVA. Continue aspirin, Plavix and the statin. Aspiration and fall precaution.  All the records are reviewed and case discussed with ED provider. Management plans discussed with the patient, his wife and daughter and they are in agreement.  CODE STATUS: Full code  TOTAL TIME TAKING CARE OF THIS PATIENT: 52 minutes.    Demetrios Loll M.D on 10/14/2016 at 12:29 PM  Between 7am to 6pm - Pager - [phone]  After 6pm go to [email or web address] - Proofreader  [employer] [facility]  Office  [phone]  CC: Primary care physician; Coral Spikes, DO   Note: This dictation was prepared with Dragon dictation along with smaller phrase technology. Any transcriptional errors that result from this process are  unintentional. 

## 2016-10-14 NOTE — Assessment & Plan Note (Signed)
New problem. Uncertain etiology at this time. Favor underlying osteoarthritis. Obtaining x-ray today prior to going to the ER.

## 2016-10-14 NOTE — Assessment & Plan Note (Signed)
New problem. Patient is clearly dehydrated. Not well appearing. I discussed this with patient and family. Calling EMS for transport to the ER. Recommend admission (at least observation) for IV fluids and antiemetics.

## 2016-10-15 LAB — BASIC METABOLIC PANEL
Anion gap: 6 (ref 5–15)
BUN: 41 mg/dL — ABNORMAL HIGH (ref 6–20)
CHLORIDE: 102 mmol/L (ref 101–111)
CO2: 27 mmol/L (ref 22–32)
CREATININE: 1.96 mg/dL — AB (ref 0.61–1.24)
Calcium: 8.5 mg/dL — ABNORMAL LOW (ref 8.9–10.3)
GFR calc Af Amer: 37 mL/min — ABNORMAL LOW (ref >60)
GFR calc non Af Amer: 32 mL/min — ABNORMAL LOW (ref >60)
GLUCOSE: 111 mg/dL — AB (ref 65–99)
Potassium: 3.8 mmol/L (ref 3.5–5.1)
Sodium: 135 mmol/L (ref 135–145)

## 2016-10-15 LAB — CBC
HCT: 28.5 % — ABNORMAL LOW (ref 40.0–52.0)
HEMOGLOBIN: 9.4 g/dL — AB (ref 13.0–18.0)
MCH: 30.7 pg (ref 26.0–34.0)
MCHC: 33.1 g/dL (ref 32.0–36.0)
MCV: 92.7 fL (ref 80.0–100.0)
Platelets: 244 10*3/uL (ref 150–440)
RBC: 3.08 MIL/uL — ABNORMAL LOW (ref 4.40–5.90)
RDW: 13.3 % (ref 11.5–14.5)
WBC: 12.5 10*3/uL — ABNORMAL HIGH (ref 3.8–10.6)

## 2016-10-15 LAB — GLUCOSE, CAPILLARY
GLUCOSE-CAPILLARY: 116 mg/dL — AB (ref 65–99)
GLUCOSE-CAPILLARY: 175 mg/dL — AB (ref 65–99)
GLUCOSE-CAPILLARY: 105 mg/dL — AB (ref 65–99)
GLUCOSE-CAPILLARY: 142 mg/dL — AB (ref 65–99)

## 2016-10-15 NOTE — Progress Notes (Signed)
[place] at [place] NAME: Jacob Buck    MR#:  [id number]  DATE OF BIRTH:  12/01/1940  SUBJECTIVE:  CHIEF COMPLAINT:   Chief Complaint  Patient presents with  . Weakness    Had recent strokes and hospital and then rehab admission due to that. Now having vomiting and diarrhea- came with that. Have weakness , but his vomiting resolved now, no more BM.  REVIEW OF SYSTEMS:  CONSTITUTIONAL: No fever,positive for fatigue or weakness.  EYES: No blurred or double vision.  EARS, NOSE, AND THROAT: No tinnitus or ear pain.  RESPIRATORY: No cough, shortness of breath, wheezing or hemoptysis.  CARDIOVASCULAR: No chest pain, orthopnea, edema.  GASTROINTESTINAL: No nausea, vomiting, diarrhea or abdominal pain.  GENITOURINARY: No dysuria, hematuria.  ENDOCRINE: No polyuria, nocturia,  HEMATOLOGY: No anemia, easy bruising or bleeding SKIN: No rash or lesion. MUSCULOSKELETAL: No joint pain or arthritis.   NEUROLOGIC: No tingling, numbness, weakness.  PSYCHIATRY: No anxiety or depression.   ROS  DRUG ALLERGIES:   Allergies  Allergen Reactions  . Toujeo Solostar [Insulin Glargine] Other (See Comments)    Reaction : Numbness in arm, tingling    VITALS:  Blood pressure (!) 151/71, pulse 72, temperature 97.7 F (36.5 C), temperature source Oral, resp. rate (!) 22, height 6\' 2"  (1.88 m), weight 130.2 kg (287 lb), SpO2 96 %.  PHYSICAL EXAMINATION:  GENERAL:  76 y.o.-year-old patient lying in the bed with no acute distress.  EYES: Pupils equal, round, reactive to light and accommodation. No scleral icterus. Extraocular muscles intact.  HEENT: Head atraumatic, normocephalic. Oropharynx and nasopharynx clear.  NECK:  Supple, no jugular venous distention. No thyroid enlargement, no tenderness.  LUNGS: Normal breath sounds bilaterally, no wheezing, rales,rhonchi or crepitation. No use of accessory muscles of respiration.  CARDIOVASCULAR: S1, S2 normal.  No murmurs, rubs, or gallops.  ABDOMEN: Soft, nontender, nondistended. Bowel sounds present. No organomegaly or mass.  EXTREMITIES: No pedal edema, cyanosis, or clubbing.  NEUROLOGIC: Cranial nerves II through XII are intact. Muscle strength 5/5 in left sided extremities 2/5 on right sided extremities. Sensation intact. Gait not checked.  PSYCHIATRIC: The patient is alert and oriented x 3.  SKIN: No obvious rash, lesion, or ulcer.   Physical Exam LABORATORY PANEL:   CBC  Recent Labs Lab 10/15/16 0434  WBC 12.5*  HGB 9.4*  HCT 28.5*  PLT 244   ------------------------------------------------------------------------------------------------------------------  Chemistries   Recent Labs Lab 10/13/16 0730  10/14/16 1026 10/15/16 0434  NA 131*  < >  --  135  K 4.0  < >  --  3.8  CL 98*  < >  --  102  CO2 22  < >  --  27  GLUCOSE 218*  < >  --  111*  BUN 54*  < >  --  41*  CREATININE 2.74*  < >  --  1.96*  CALCIUM 8.6*  < >  --  8.5*  MG  --   --  2.2  --   AST 74*  --   --   --   ALT 81*  --   --   --   ALKPHOS 68  --   --   --   BILITOT 1.8*  --   --   --   < > = values in this interval not displayed. ------------------------------------------------------------------------------------------------------------------  Cardiac Enzymes  Recent Labs Lab 10/13/16 0730  TROPONINI <0.03   ------------------------------------------------------------------------------------------------------------------  RADIOLOGY:  Dg Wrist Complete Right  Result Date: 10/14/2016 CLINICAL DATA:  Acute right wrist pain without known injury. EXAM: RIGHT WRIST - COMPLETE 3+ VIEW COMPARISON:  None. FINDINGS: No acute fracture or dislocation is noted. Vascular calcifications are noted. Severe narrowing of radiocarpal joint is noted consistent with degenerative joint disease. Probable old ulnar styloid fracture is noted. 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 No  acute abnormality seen in the right wrist. Electronically Signed   By: Marijo Conception, M.D.   On: 10/14/2016 12:47    ASSESSMENT AND PLAN:   Active Problems:   Acute gastroenteritis  * Nausea vomiting and diarrhea, possible acute gastroenteritis  normal saline IV, Zofran when necessary, hold Colace.  Clear liquid diet , Advance diet if tolerated.  * ARF on CKD stage III due to dehydration. Continue normal saline IV and follow-up BMP.  *Hyponatremia. Continue normal saline IV and follow-up BMP. * Diabetes. Hold Levemir due to poor oral intake, start sliding scale. * Hypertension. Continue hypertension medication. * Recent CVA. Continue aspirin, Plavix and the statin. * Aspiration and fall precaution.      All the records are reviewed and case discussed with Care Management/Social Workerr. Management plans discussed with the patient, family and they are in agreement.  CODE STATUS: full  TOTAL TIME TAKING CARE OF THIS PATIENT: 35 minutes.    POSSIBLE D/C IN 1-2 DAYS, DEPENDING ON CLINICAL CONDITION.   Vaughan Basta M.D on 10/15/2016   Between 7am to 6pm - Pager - [phone]  After 6pm go to [email or web address] - password [email or web address] [place] [facility]  Office  [phone]  CC: Primary care physician; Coral Spikes, DO  Note: This dictation was prepared with Dragon dictation along with smaller phrase technology. Any transcriptional errors that result from this process are unintentional.

## 2016-10-15 NOTE — Progress Notes (Signed)
Family at bedside. Asked several times during shift if we could turn patient off his back.  He refused. Advised on need to prevent skin breakdown but he again refused.  Did allow pillows under heels. Lauris Poag, RN 10-15-16 [phone]

## 2016-10-16 LAB — BASIC METABOLIC PANEL
Anion gap: 8 (ref 5–15)
BUN: 38 mg/dL — AB (ref 6–20)
CALCIUM: 8.4 mg/dL — AB (ref 8.9–10.3)
CO2: 25 mmol/L (ref 22–32)
CREATININE: 2.04 mg/dL — AB (ref 0.61–1.24)
Chloride: 100 mmol/L — ABNORMAL LOW (ref 101–111)
GFR calc Af Amer: 35 mL/min — ABNORMAL LOW (ref >60)
GFR, EST NON AFRICAN AMERICAN: 30 mL/min — AB (ref >60)
GLUCOSE: 140 mg/dL — AB (ref 65–99)
Potassium: 3.5 mmol/L (ref 3.5–5.1)
SODIUM: 133 mmol/L — AB (ref 135–145)

## 2016-10-16 LAB — GLUCOSE, CAPILLARY
GLUCOSE-CAPILLARY: 141 mg/dL — AB (ref 65–99)
GLUCOSE-CAPILLARY: 148 mg/dL — AB (ref 65–99)
Glucose-Capillary: 146 mg/dL — ABNORMAL HIGH (ref 65–99)
Glucose-Capillary: 162 mg/dL — ABNORMAL HIGH (ref 65–99)

## 2016-10-16 NOTE — Care Management Obs Status (Signed)
[place] NOTIFICATION   Patient Details  Name: Jacob Buck MRN: [id number] Date of Birth: 1940-12-23   Medicare Observation Status Notification Given:  Yes [facility] letter given)    Mardene Speak, RN 10/16/2016, 2:42 PM

## 2016-10-16 NOTE — NC FL2 (Signed)
[place] LEVEL OF CARE SCREENING TOOL     IDENTIFICATION  Patient Name: Jacob Buck Birthdate: 06/28/1941 Sex: male Admission Date (Current Location): 10/14/2016  [place] and [place] Number:  Engineering geologist and Address:  [facility], [address] [place], [place] [address]      Provider Number: [id number]  Attending Physician Name and Address:  Vaughan Basta, MD  Relative Name and Phone Number:       Current Level of Care: Hospital Recommended Level of Care: [place] Prior Approval Number:    Date Approved/Denied:   PASRR Number: [id number]  Discharge Plan: SNF    Current Diagnoses: Patient Active Problem List   Diagnosis Date Noted  . Nausea vomiting and diarrhea 10/14/2016  . Right wrist pain 10/14/2016  . Acute gastroenteritis 10/14/2016  . Aortic atherosclerosis ([place]) 04/27/2016  . Coronary artery disease involving native heart without angina pectoris 04/27/2016  . Mild obstructive sleep apnea 08/02/2015  . Stroke ([place]) 07/21/2015  . Morbid obesity ([place]) 07/21/2015  . Cardiomyopathy, ischemic 06/12/2015  . Anemia 05/31/2015  . Essential hypertension 05/31/2015  . Hyperlipidemia 05/31/2015  . Non-compliant behavior 12/09/2014  . Type 2 diabetes, uncontrolled, with renal manifestation ([place]) 06/05/2014  . CKD stage 3 due to type 2 diabetes mellitus ([place]) 06/05/2014  . Diabetic retinopathy ([place]) 06/05/2014  . Diabetic neuropathy ([place]) 06/05/2014  . Status post amputation of lesser toe of right foot ([place]) 06/05/2014  . History of colon cancer 06/05/2014    Orientation RESPIRATION BLADDER Height & Weight     Self, Time, Situation  Normal Incontinent Weight: 287 lb (130.2 kg) Height:  6\' 2"  (188 cm)  BEHAVIORAL SYMPTOMS/MOOD NEUROLOGICAL BOWEL NUTRITION STATUS      Incontinent Diet (Heart healthy)  AMBULATORY STATUS COMMUNICATION OF NEEDS Skin   Extensive Assist Verbally Normal                       Personal Care Assistance Level of Assistance    Bathing Assistance: Limited assistance Feeding assistance: Independent Dressing Assistance: Limited assistance     Functional Limitations Info    Sight Info: Adequate Hearing Info: Adequate Speech Info: Adequate    SPECIAL CARE FACTORS FREQUENCY  PT (By licensed PT)     PT Frequency: Up to 5X per day, 5 days per week              Contractures Contractures Info: Present    Additional Factors Info  Allergies   Allergies Info: Toujeo Solostar Insulin Glargine           Current Medications (10/16/2016):  This is the current hospital active medication list Current Facility-Administered Medications  Medication Dose Route Frequency Provider Last Rate Last Dose  . acetaminophen (TYLENOL) tablet 650 mg  650 mg Oral Q6H PRN Demetrios Loll, MD   650 mg at 10/16/16 0604   Or  . acetaminophen (TYLENOL) suppository 650 mg  650 mg Rectal Q6H PRN Demetrios Loll, MD      . albuterol (PROVENTIL) (2.5 MG/3ML) 0.083% nebulizer solution 2.5 mg  2.5 mg Nebulization Q2H PRN Demetrios Loll, MD      . amLODipine (NORVASC) tablet 5 mg  5 mg Oral Daily Demetrios Loll, MD   5 mg at 10/16/16 1024  . aspirin chewable tablet 81 mg  81 mg Oral Daily Demetrios Loll, MD   81 mg at 10/16/16 1024  . carvedilol (COREG) tablet 25 mg  25 mg Oral BID  WC Demetrios Loll, MD   25 mg at 10/16/16 1657  . clopidogrel (PLAVIX) tablet 75 mg  75 mg Oral Daily Demetrios Loll, MD   75 mg at 10/16/16 1024  . heparin injection 5,000 Units  5,000 Units Subcutaneous Q8H Demetrios Loll, MD   5,000 Units at 10/16/16 1323  . hydrALAZINE (APRESOLINE) tablet 25 mg  25 mg Oral Q8H Demetrios Loll, MD   25 mg at 10/16/16 1323  . HYDROcodone-acetaminophen (NORCO/VICODIN) 5-325 MG per tablet 1-2 tablet  1-2 tablet Oral Q4H PRN Demetrios Loll, MD   1 tablet at 10/16/16 0106  . insulin aspart (novoLOG) injection 0-5 Units  0-5 Units Subcutaneous QHS Demetrios Loll, MD      . insulin aspart (novoLOG) injection 0-9  Units  0-9 Units Subcutaneous TID WC Demetrios Loll, MD   1 Units at 10/16/16 1656  . ondansetron (ZOFRAN) tablet 4 mg  4 mg Oral Q6H PRN Demetrios Loll, MD       Or  . ondansetron [facility]) injection 4 mg  4 mg Intravenous Q6H PRN Demetrios Loll, MD      . rosuvastatin (CRESTOR) tablet 20 mg  20 mg Oral QPM Demetrios Loll, MD   20 mg at 10/16/16 1700  . senna-docusate (Senokot-S) tablet 1 tablet  1 tablet Oral QHS PRN Demetrios Loll, MD      . tamsulosin [facility]) capsule 0.4 mg  0.4 mg Oral Daily Demetrios Loll, MD   0.4 mg at 10/16/16 1024     Discharge Medications: Please see discharge summary for a list of discharge medications.  Relevant Imaging Results:  Relevant Lab Results:   Additional Information SS# [id number]  Zettie Pho, LCSW

## 2016-10-16 NOTE — Clinical Social Work Note (Signed)
Clinical Social Work Assessment  Patient Details  Name: Jacob Buck MRN: [id number] Date of Birth: [birth year]  Date of referral:  10/16/16               Reason for consult:  Facility Placement                Permission sought to share information with:  Facility Art therapist granted to share information::  Yes, Release of Information Signed  Name::        Agency::     Relationship::     Contact Information:     Housing/Transportation Living arrangements for the past 2 months:  [place] of Information:  Medical Team, Facility Patient Interpreter Needed:  None Criminal Activity/Legal Involvement Pertinent to Current Situation/Hospitalization:  No - Comment as needed Significant Relationships:  Adult Children, Spouse Lives with:  Spouse Do you feel safe going back to the place where you live?  Yes Need for family participation in patient care:  No (Coment)  Care giving concerns:  Patient admitted from [facility]   Social Worker assessment / plan:  ED CSW saw patient and family. The ED CSW was informed by Broadus John from [facility] that and he/Tammy was informed  to HOLD patient bed.   The family in the process of transferring patient to [facility] if bed available at DC/ if no bed available he is to return to [facility]. FL2 and Passr number has already been faxed to [facility] by SW at [employer].  CSW will follow up with [facility] on Monday to determine the receiving facility.  Employment status:  Retired Nurse, adult PT Recommendations:    Information / Referral to community resources:     Patient/Family's Response to care: Family is agreeable  Patient/Family's Understanding of and Emotional Response to Diagnosis, Current Treatment, and Prognosis:  Family in agreement with dc plan.  Emotional Assessment Appearance:  Appears stated age Attitude/Demeanor/Rapport:   (Unable to see patient) Affect (typically observed):    (Unable to see patient) Orientation:   (Unable to see patient) Alcohol / Substance use:  Never Used Psych involvement (Current and /or in the community):  No (Comment)  Discharge Needs  Concerns to be addressed:  Care Coordination, Discharge Planning Concerns Readmission within the last 30 days:  Yes Current discharge risk:  None Barriers to Discharge:  Continued Medical Work up   [employer], LCSW 10/16/2016, 5:13 PM

## 2016-10-16 NOTE — Progress Notes (Signed)
[name]    MR#:  [id number]  DATE OF BIRTH:  Nov 02, 1940  SUBJECTIVE:  CHIEF COMPLAINT:   Chief Complaint  Patient presents with  . Weakness    Had recent strokes and hospital and then rehab admission due to that. Now having vomiting and diarrhea- came with that. Have weakness , but his vomiting resolved now, had BM this morning in diper- loose. Had low grade fever last night.  REVIEW OF SYSTEMS:  CONSTITUTIONAL: No fever,positive for fatigue or weakness.  EYES: No blurred or double vision.  EARS, NOSE, AND THROAT: No tinnitus or ear pain.  RESPIRATORY: No cough, shortness of breath, wheezing or hemoptysis.  CARDIOVASCULAR: No chest pain, orthopnea, edema.  GASTROINTESTINAL: No nausea, vomiting, diarrhea or abdominal pain.  GENITOURINARY: No dysuria, hematuria.  ENDOCRINE: No polyuria, nocturia,  HEMATOLOGY: No anemia, easy bruising or bleeding SKIN: No rash or lesion. MUSCULOSKELETAL: No joint pain or arthritis.   NEUROLOGIC: No tingling, numbness, weakness.  PSYCHIATRY: No anxiety or depression.   ROS  DRUG ALLERGIES:   Allergies  Allergen Reactions  . Toujeo Solostar [Insulin Glargine] Other (See Comments)    Reaction : Numbness in arm, tingling    VITALS:  Blood pressure 122/74, pulse 76, temperature 98.3 F (36.8 C), temperature source Oral, resp. rate 18, height 6\' 2"  (1.88 m), weight 130.2 kg (287 lb), SpO2 98 %.  PHYSICAL EXAMINATION:  GENERAL:  76 y.o.-year-old patient lying in the bed with no acute distress.  EYES: Pupils equal, round, reactive to light and accommodation. No scleral icterus. Extraocular muscles intact.  HEENT: Head atraumatic, normocephalic. Oropharynx and nasopharynx clear.  NECK:  Supple, no jugular venous distention. No thyroid enlargement, no tenderness.  LUNGS: Normal breath sounds bilaterally, no wheezing, rales,rhonchi or crepitation. No use of accessory muscles  of respiration.  CARDIOVASCULAR: S1, S2 normal. No murmurs, rubs, or gallops.  ABDOMEN: Soft, nontender, nondistended. Bowel sounds present. No organomegaly or mass.  EXTREMITIES: No pedal edema, cyanosis, or clubbing.  NEUROLOGIC: Cranial nerves II through XII are intact. Muscle strength 5/5 in left sided extremities 2/5 on right sided extremities. Sensation intact. Gait not checked.  PSYCHIATRIC: The patient is alert and oriented x 3.  SKIN: No obvious rash, lesion, or ulcer.   Physical Exam LABORATORY PANEL:   CBC  Recent Labs Lab 10/15/16 0434  WBC 12.5*  HGB 9.4*  HCT 28.5*  PLT 244   ------------------------------------------------------------------------------------------------------------------  Chemistries   Recent Labs Lab 10/13/16 0730  10/14/16 1026  10/16/16 0410  NA 131*  < >  --   < > 133*  K 4.0  < >  --   < > 3.5  CL 98*  < >  --   < > 100*  CO2 22  < >  --   < > 25  GLUCOSE 218*  < >  --   < > 140*  BUN 54*  < >  --   < > 38*  CREATININE 2.74*  < >  --   < > 2.04*  CALCIUM 8.6*  < >  --   < > 8.4*  MG  --   --  2.2  --   --   AST 74*  --   --   --   --   ALT 81*  --   --   --   --   ALKPHOS 68  --   --   --   --  BILITOT 1.8*  --   --   --   --   < > = values in this interval not displayed. ------------------------------------------------------------------------------------------------------------------  Cardiac Enzymes  Recent Labs Lab 10/13/16 0730  TROPONINI <0.03   ------------------------------------------------------------------------------------------------------------------  RADIOLOGY:  No results found.  ASSESSMENT AND PLAN:   Active Problems:   Acute gastroenteritis  * Nausea vomiting and diarrhea, possible acute gastroenteritis  normal saline IV, Zofran when necessary, hold Colace.  Clear liquid diet , Advance diet if tolerated.  As per his wife after his stroke he was on soft diet but then when he started having episodes  of vomiting day change him to liquid diet, so I will keep him on dysphagia for now and get swallow evaluation in hospital.   He also had fever low-grade last night, so will monitor 1 more day in hospital any fever then we will repeat chest x-ray to look for respirations.  * ARF on CKD stage III due to dehydration. Continue normal saline IV and follow-up BMP. Stable.  *Hyponatremia. Continue normal saline IV and follow-up BMP. * Diabetes. Hold Levemir due to poor oral intake, start sliding scale. * Hypertension. Continue hypertension medication. * Recent CVA. Continue aspirin, Plavix and the statin. * Aspiration and fall precaution.   We'll get physical therapy evaluation, as per wife he had all services set up at home recently when he was discharged from rehabilitation, we will continue the same on his discharge possibly tomorrow.    All the records are reviewed and case discussed with Care Management/Social Workerr. Management plans discussed with the patient, family and they are in agreement.  CODE STATUS: full  TOTAL TIME TAKING CARE OF THIS PATIENT: 35 minutes.    POSSIBLE D/C IN 1-2 DAYS, DEPENDING ON CLINICAL CONDITION.   Vaughan Basta M.D on 10/16/2016   Between 7am to 6pm - Pager - [phone]  After 6pm go to [email or web address] - password [email or web address] [place]  Office  [phone]  CC: Primary care physician; Coral Spikes, DO  Note: This dictation was prepared with Dragon dictation along with smaller phrase technology. Any transcriptional errors that result from this process are unintentional.

## 2016-10-17 ENCOUNTER — Telehealth: Payer: Self-pay | Admitting: *Deleted

## 2016-10-17 LAB — GLUCOSE, CAPILLARY
Glucose-Capillary: 111 mg/dL — ABNORMAL HIGH (ref 65–99)
Glucose-Capillary: 148 mg/dL — ABNORMAL HIGH (ref 65–99)

## 2016-10-17 NOTE — Clinical Social Work Placement (Signed)
   CLINICAL SOCIAL WORK PLACEMENT  NOTE  Date:  10/17/2016  Patient Details  Name: Jacob Buck MRN: [id number] Date of Birth: 1941-05-18  Clinical Social Work is seeking post-discharge placement for this patient at the [place] level of care (*CSW will initial, date and re-position this form in  chart as items are completed):  Yes   Patient/family provided with [place] Work Department's list of facilities offering this level of care within the geographic area requested by the patient (or if unable, by the patient's family).  Yes   Patient/family informed of their freedom to choose among providers that offer the needed level of care, that participate in Medicare, Medicaid or managed care program needed by the patient, have an available bed and are willing to accept the patient.  Yes   Patient/family informed of [facility]'s ownership interest in [facility] and [facility], as well as of the fact that they are under no obligation to receive care at these facilities.  PASRR submitted to EDS on       PASRR number received on       Existing PASRR number confirmed on 10/16/16     FL2 transmitted to all facilities in geographic area requested by pt/family on 10/17/16     FL2 transmitted to all facilities within larger geographic area on       Patient informed that his/her managed care company has contracts with or will negotiate with certain facilities, including the following:        Yes   Patient/family informed of bed offers received.  Patient chooses bed at  [facility])     Physician recommends and patient chooses bed at  [facility])    Patient to be transferred to  [facility]) on 10/17/16.  Patient to be transferred to facility by  (EMS)     Patient family notified on 10/17/16 of transfer.  Name of family member notified:   (patient's wife and daughter)     PHYSICIAN       Additional Comment:     _______________________________________________ Shela Leff, LCSW 10/17/2016, 12:54 PM

## 2016-10-17 NOTE — Telephone Encounter (Signed)
Patient will discharge from [facility] on 04/16. Pt has been schedule for a HFU

## 2016-10-17 NOTE — Progress Notes (Signed)
Dr Anselm Jungling gave order to discontinue isolation

## 2016-10-17 NOTE — Progress Notes (Signed)
Patient sent out via EMS.

## 2016-10-17 NOTE — Progress Notes (Signed)
Report called to [place] at [facility].  EMS being notified for transportation

## 2016-10-17 NOTE — Clinical Social Work Note (Signed)
Patient to discharge today and patient's wife and daughter are adamant that patient will not return to [facility]. Referral was sent out this morning to additional facilities other than [facility] and offers were extended to patient's wife. Patient's wife chose [facility]. Tiffany at [facility] is aware and discharge information sent. Patient's wife aware that there is always a chance that insurance may not pay for EMS transport, however she wishes to proceed with patient going by EMS. Shela Leff MSW,LCSW [phone]

## 2016-10-17 NOTE — Evaluation (Signed)
Physical Therapy Evaluation Patient Details Name: Jacob Buck MRN: [id number] DOB: April 10, 1941 Today's Date: 10/17/2016   History of Present Illness  Pt is a 76 y.o. male presenting to hospital with weakness, N/V, and dehydration with diarrhea.  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  Pt admitted to hospital with possible acute gastroenteritis, ARF of CKD stage III d/t dehydration, and hyponatremia.  PMH includes recent h/o PCA infarct with blurry vision, h/o CVA, neuropathy, B foot drop, CKD, htn, diabetes, chronic systolic heart failure.  Clinical Impression  Prior to recent hospital admissions, pt was ambulating short household distances with RW but pt's family reports pt has not gotten out of bed since recent discharge to rehab facility after CVA.  Currently pt is 1-2 assist with bed mobility and max assist x2 to stand with RW (bed height elevated).  Pt requiring sitting rest breaks between stands (d/t fatigue and SOB) and able to maintain standing for a few seconds with significant 2 person assist.  Pt would benefit from skilled PT to address noted impairments and functional limitations.  Recommend pt discharge to STR when medically appropriate.    Follow Up Recommendations SNF    Equipment Recommendations  Rolling walker with 5" wheels    Recommendations for Other Services       Precautions / Restrictions Precautions Precautions: Fall Precaution Comments: Aspiration Restrictions Weight Bearing Restrictions: No      Mobility  Bed Mobility Overal bed mobility: Needs Assistance Bed Mobility: Supine to Sit;Sit to Supine     Supine to sit: Max assist;HOB elevated Sit to supine: Mod assist;+2 for physical assistance;HOB elevated   General bed mobility comments: assist for trunk and B LE's; increased effort to perform; assist to scoot up in bed end of session  Transfers Overall transfer level: Needs assistance Equipment used: Rolling walker (2  wheeled) Transfers: Sit to/from Stand Sit to Stand: Max assist;+2 physical assistance;From elevated surface         General transfer comment: bed height significantly elevated; vc's and tactile cues for hand and feet placement; assist to scoot to edge of bed; x4 trials standing with rest breaks between d/t SOB and fatigue (only able to almost fully stand on 3 trials)  Ambulation/Gait             General Gait Details: unable at this time d/t unable to maintain standing to perform  Stairs            Wheelchair Mobility    Modified Rankin (Stroke Patients Only)       Balance Overall balance assessment: Needs assistance Sitting-balance support: Bilateral upper extremity supported;Feet supported Sitting balance-Leahy Scale: Poor Sitting balance - Comments: initially requiring max assist for sitting balance but improved to close SBA with intermittent min assist Postural control: Posterior lean Standing balance support: Bilateral upper extremity supported Standing balance-Leahy Scale: Poor Standing balance comment: pt able to stand for a few seconds before needing to sit back down (posterior lean noted)                             Pertinent Vitals/Pain  Pain 7/10 R knee and R hand with standing but pt reporting decreasing pain end of session with rest and declined pain meds.    Home Living Family/patient expects to be discharged to:: Private residence Living Arrangements: Spouse/significant other;Children (Wife and daughter) Available Help at Discharge: Family;Available 24 hours/day Type of Home: House Home Access:  Ramped entrance     Home Layout: One level Home Equipment: Watchtower - 2 wheels;Wheelchair - [employer];Other (comment) (Sit to stand lift (from previous family member but unsure if works))      Prior Function Level of Independence: Needs assistance   Gait / Transfers Assistance Needed: Pt normally able to transfer OOB and use RW to  ambulate short household distances.  Pt and pt's family reports pt has not been OOB since going to rehab facility recently.  ADL's / Homemaking Assistance Needed: Per last recent PT eval:  Pt was able to feed self after set up using L hand only with freq cues to slow down; assist needed for bathing and dressing and was able to assist with grooming after set up.  Total assist for home making skills--wife or daughter assisted.         Hand Dominance        Extremity/Trunk Assessment   Upper Extremity Assessment Upper Extremity Assessment: Generalized weakness    Lower Extremity Assessment Lower Extremity Assessment:  (B foot drop; B LE hip flexion and knee flexion/extension 3-/5)    Cervical / Trunk Assessment Cervical / Trunk Assessment: Kyphotic  Communication   Communication: No difficulties (Sometimes has slurred speech)  Cognition Arousal/Alertness: Awake/alert Behavior During Therapy: WFL for tasks assessed/performed Overall Cognitive Status: Within Functional Limits for tasks assessed                                        General Comments   Nursing cleared pt for participation in physical therapy.  Pt agreeable to PT session.  Pt's wife present entire session and daughter present part of session.    Exercises  Transfer training   Assessment/Plan    PT Assessment Patient needs continued PT services  PT Problem List Decreased strength;Decreased activity tolerance;Decreased balance;Decreased mobility;Pain       PT Treatment Interventions DME instruction;Gait training;Functional mobility training;Balance training;Therapeutic exercise;Therapeutic activities;Patient/family education    PT Goals (Current goals can be found in the Care Plan section)  Acute Rehab PT Goals Patient Stated Goal: to go home PT Goal Formulation: With patient Time For Goal Achievement: 10/31/16 Potential to Achieve Goals: Fair    Frequency Min 2X/week   Barriers to  discharge Decreased caregiver support      Co-evaluation               End of Session Equipment Utilized During Treatment: Gait belt Activity Tolerance: Patient limited by fatigue Patient left: in bed;with call bell/phone within reach;with family/visitor present;with bed alarm set (B heels elevated via pillow) Nurse Communication: Mobility status PT Visit Diagnosis: Muscle weakness (generalized) (M62.81);Difficulty in walking, not elsewhere classified (R26.2)    Time: 9179-1505 PT Time Calculation (min) (ACUTE ONLY): 48 min   Charges:   PT Evaluation $PT Eval Low Complexity: 1 Procedure PT Treatments $Therapeutic Activity: 23-37 mins   PT G Codes:   PT G-Codes **NOT FOR INPATIENT CLASS** Functional Assessment Tool Used: AM-PAC 6 Clicks Basic Mobility Functional Limitation: Mobility: Walking and moving around Mobility: Walking and Moving Around Current Status (W9794): 100 percent impaired, limited or restricted Mobility: Walking and Moving Around Goal Status (I0165): At least 40 percent but less than 60 percent impaired, limited or restricted    Leitha Bleak, PT 10/17/16, 4:17 PM [phone]

## 2016-10-17 NOTE — Discharge Summary (Signed)
[facility] NAME: Jacob Buck    MR#:  [id number]  DATE OF BIRTH:  12-21-1940  DATE OF ADMISSION:  10/14/2016 ADMITTING PHYSICIAN: Demetrios Loll, MD  DATE OF DISCHARGE: 10/17/2016  PRIMARY CARE PHYSICIAN: Coral Spikes, DO    ADMISSION DIAGNOSIS:  Dehydration [E86.0] Weakness [R53.1] Nausea vomiting and diarrhea [R11.2, R19.7] Leukocytosis, unspecified type [D72.829]  DISCHARGE DIAGNOSIS:  Active Problems:   Acute gastroenteritis   SECONDARY DIAGNOSIS:   Past Medical History:  Diagnosis Date  . Arthritis 2008  . Cancer [facility]) 1991   Colon; right kidney ?  . Cardiomyopathy ([place])    a. ? isch vs nicm;  b. 05/2015 Echo: EF 35-40%, mild MR, mildly dil LA, poor acoustic windows.  . CKD (chronic kidney disease), stage III   . Diverticulosis   . DM type 2 (diabetes mellitus, type 2) ([place]) 2008  . Hyperlipidemia   . Hypertension   . Kidney stone   . Stroke [facility])    a. 05/2015 MRI: acute to early subacute R thalamic infarct, chronic L thalamic infarct, late subacute to chronic right occipital infarct;  b. 05/2015 Carotid U/S: RICA 4-09%W, LICA 1-19%J, patent vertebrals;  b. 06/2015 Event Monitor: RSR, rare PVC's.    HOSPITAL COURSE:   * Nausea vomiting and diarrhea, possible acute gastroenteritis  normal saline IV, Zofran when necessary, hold Colace.  Clear liquid diet , Advance diet if tolerated.  As per his wife after his stroke he was on soft diet but then when he started having episodes of vomiting day change him to liquid diet, so I will keep him on dysphagia for now and get swallow evaluation in hospital.   He also had fever low-grade 10/15/16 night, no fever in last 36 hrs.   No diarrhea in last > 24 hrs- so d/c contact isolation.  * ARF on CKDstage III due to dehydration. Continue normal saline IV and follow-up BMP. Stable.  *Hyponatremia. Continue normal saline IV and follow-up BMP. * Diabetes. Hold Levemir  due to poor oral intake, start sliding scale.   Blood sugar is under control without any levemir- may need to start in future- once oral intake increase. * Hypertension. Continue hypertension medication. * Recent CVA. Continue aspirin, Plavix and the statin. * Aspiration and fall precaution.  DISCHARGE CONDITIONS:   Stable.  CONSULTS OBTAINED:    DRUG ALLERGIES:   Allergies  Allergen Reactions  . Toujeo Solostar [Insulin Glargine] Other (See Comments)    Reaction : Numbness in arm, tingling    DISCHARGE MEDICATIONS:   Current Discharge Medication List    CONTINUE these medications which have NOT CHANGED   Details  amLODipine (NORVASC) 5 MG tablet Take 1 tablet (5 mg total) by mouth daily. Qty: 30 tablet, Refills: 2    aspirin 81 MG chewable tablet Chew 1 tablet (81 mg total) by mouth daily. Qty: 30 tablet, Refills: 2    carvedilol (COREG) 25 MG tablet Take 1 tablet (25 mg total) by mouth 2 (two) times daily with a meal. Qty: 180 tablet, Refills: 3    clopidogrel (PLAVIX) 75 MG tablet Take 1 tablet (75 mg total) by mouth daily. Qty: 30 tablet, Refills: 2    docusate sodium (COLACE) 100 MG capsule Take 2 capsules (200 mg total) by mouth 2 (two) times daily. Qty: 120 capsule, Refills: 3    hydrALAZINE (APRESOLINE) 25 MG tablet Take 1 tablet (25 mg total) by mouth every 8 (eight) hours.  Qty: 90 tablet, Refills: 2    rosuvastatin (CRESTOR) 20 MG tablet Take 1 tablet (20 mg total) by mouth daily. Qty: 90 tablet, Refills: 3    tamsulosin (FLOMAX) 0.4 MG CAPS capsule Take 0.4 mg by mouth daily.    loperamide (IMODIUM A-D) 2 MG tablet Take 1 tablet (2 mg total) by mouth 4 (four) times daily as needed for diarrhea or loose stools. Qty: 12 tablet, Refills: 0    ondansetron (ZOFRAN ODT) 4 MG disintegrating tablet Take 1 tablet (4 mg total) by mouth every 8 (eight) hours as needed for nausea or vomiting. Qty: 20 tablet, Refills: 0      STOP taking these medications      Insulin Detemir (LEVEMIR FLEXTOUCH) 100 UNIT/ML Pen      Insulin Pen Needle (BD PEN NEEDLE NANO U/F) 32G X 4 MM MISC      promethazine (PHENERGAN) 25 MG/ML injection          DISCHARGE INSTRUCTIONS:    Follow with PMD in 1 week.  If you experience worsening of your admission symptoms, develop shortness of breath, life threatening emergency, suicidal or homicidal thoughts you must seek medical attention immediately by calling 911 or calling your MD immediately  if symptoms less severe.  You Must read complete instructions/literature along with all the possible adverse reactions/side effects for all the Medicines you take and that have been prescribed to you. Take any new Medicines after you have completely understood and accept all the possible adverse reactions/side effects.   Please note  You were cared for by a hospitalist during your hospital stay. If you have any questions about your discharge medications or the care you received while you were in the hospital after you are discharged, you can call the unit and asked to speak with the hospitalist on call if the hospitalist that took care of you is not available. Once you are discharged, your primary care physician will handle any further medical issues. Please note that NO REFILLS for any discharge medications will be authorized once you are discharged, as it is imperative that you return to your primary care physician (or establish a relationship with a primary care physician if you do not have one) for your aftercare needs so that they can reassess your need for medications and monitor your lab values.    Today   CHIEF COMPLAINT:   Chief Complaint  Patient presents with  . Weakness    HISTORY OF PRESENT ILLNESS:  Jacob Buck  is a 76 y.o. male with a known history of Recent CVA, CKD stage III, diverticulosis, kidney stone, hypertension, hyperlipidemia and diabetes. The patient was sent from skilled facility to the ED due to  above chief complaints. He complains of intermittent nausea, vomiting and watery diarrhea for 3 days. He came to the ED yesterday and get CAT scan of the abdomen, which didn't show any obstruction. Since he continuously has nausea, vomiting and diarrhea, he came to the ED again. He was found leukocytosis, dehydration and hyponatremia.  VITAL SIGNS:  Blood pressure (!) 147/72, pulse 74, temperature 98.1 F (36.7 C), temperature source Oral, resp. rate 20, height 6\' 2"  (1.88 m), weight 130.2 kg (287 lb), SpO2 99 %.  I/O:   Intake/Output Summary (Last 24 hours) at 10/17/16 1212 Last data filed at 10/17/16 0910  Gross per 24 hour  Intake              615 ml  Output  1400 ml  Net             -785 ml    PHYSICAL EXAMINATION:   GENERAL:  76 y.o.-year-old patient lying in the bed with no acute distress.  EYES: Pupils equal, round, reactive to light and accommodation. No scleral icterus. Extraocular muscles intact.  HEENT: Head atraumatic, normocephalic. Oropharynx and nasopharynx clear.  NECK:  Supple, no jugular venous distention. No thyroid enlargement, no tenderness.  LUNGS: Normal breath sounds bilaterally, no wheezing, rales,rhonchi or crepitation. No use of accessory muscles of respiration.  CARDIOVASCULAR: S1, S2 normal. No murmurs, rubs, or gallops.  ABDOMEN: Soft, nontender, nondistended. Bowel sounds present. No organomegaly or mass.  EXTREMITIES: No pedal edema, cyanosis, or clubbing.  NEUROLOGIC: Cranial nerves II through XII are intact. Muscle strength 5/5 in left sided extremities 2/5 on right sided extremities. Sensation intact. Gait not checked.  PSYCHIATRIC: The patient is alert and oriented x 3.  SKIN: No obvious rash, lesion, or ulcer.   DATA REVIEW:   CBC  Recent Labs Lab 10/15/16 0434  WBC 12.5*  HGB 9.4*  HCT 28.5*  PLT 244    Chemistries   Recent Labs Lab 10/13/16 0730  10/14/16 1026  10/16/16 0410  NA 131*  < >  --   < > 133*  K 4.0  < >   --   < > 3.5  CL 98*  < >  --   < > 100*  CO2 22  < >  --   < > 25  GLUCOSE 218*  < >  --   < > 140*  BUN 54*  < >  --   < > 38*  CREATININE 2.74*  < >  --   < > 2.04*  CALCIUM 8.6*  < >  --   < > 8.4*  MG  --   --  2.2  --   --   AST 74*  --   --   --   --   ALT 81*  --   --   --   --   ALKPHOS 68  --   --   --   --   BILITOT 1.8*  --   --   --   --   < > = values in this interval not displayed.  Cardiac Enzymes  Recent Labs Lab 10/13/16 0730  TROPONINI <0.03    Microbiology Results  Results for orders placed or performed during the hospital encounter of 10/14/16  MRSA PCR Screening     Status: None   Collection Time: 10/14/16  4:35 PM  Result Value Ref Range Status   MRSA by PCR NEGATIVE NEGATIVE Final    Comment:        The GeneXpert MRSA Assay (FDA approved for NASAL specimens only), is one component of a comprehensive MRSA colonization surveillance program. It is not intended to diagnose MRSA infection nor to guide or monitor treatment for MRSA infections.     RADIOLOGY:  No results found.  EKG:   Orders placed or performed during the hospital encounter of 10/14/16  . ED EKG  . ED EKG      Management plans discussed with the patient, family and they are in agreement.  CODE STATUS:     Code Status Orders        Start     Ordered   10/14/16 1413  Full code  Continuous     10/14/16 1413    Code  Status History    Date Active Date Inactive Code Status Order ID Comments User Context   10/06/2016  5:27 PM 10/06/2016  5:27 PM Full Code [id number]  Epifanio Lesches, MD ED   10/06/2016  5:27 PM 10/08/2016  3:52 PM Full Code [id number]  Epifanio Lesches, MD ED   05/30/2015  7:05 PM 05/31/2015  8:05 PM Full Code [id number]  Vaughan Basta, MD ED      TOTAL TIME TAKING CARE OF THIS PATIENT: 35 minutes.    Vaughan Basta M.D on 10/17/2016 at 12:12 PM  Between 7am to 6pm - Pager - [phone]  After 6pm go to [email or web address] - password  [facility]  Office  [phone]  CC: Primary care physician; Coral Spikes, DO   Note: This dictation was prepared with Dragon dictation along with smaller phrase technology. Any transcriptional errors that result from this process are unintentional.

## 2016-10-17 NOTE — Evaluation (Signed)
Clinical/Bedside Swallow Evaluation Patient Details  Name: Jacob Buck MRN: [id number] Date of Birth: 1941/01/26  Today's Date: 10/17/2016 Time: SLP Start Time (ACUTE ONLY): 1250 SLP Stop Time (ACUTE ONLY): 1350 SLP Time Calculation (min) (ACUTE ONLY): 60 min  Past Medical History:  Past Medical History:  Diagnosis Date  . Arthritis 2008  . Cancer [facility]) 1991   Colon; right kidney ?  . Cardiomyopathy ([place])    a. ? isch vs nicm;  b. 05/2015 Echo: EF 35-40%, mild MR, mildly dil LA, poor acoustic windows.  . CKD (chronic kidney disease), stage III   . Diverticulosis   . DM type 2 (diabetes mellitus, type 2) ([place]) 2008  . Hyperlipidemia   . Hypertension   . Kidney stone   . Stroke [facility])    a. 05/2015 MRI: acute to early subacute R thalamic infarct, chronic L thalamic infarct, late subacute to chronic right occipital infarct;  b. 05/2015 Carotid U/S: RICA 4-09%W, LICA 1-19%J, patent vertebrals;  b. 06/2015 Event Monitor: RSR, rare PVC's.   Past Surgical History:  Past Surgical History:  Procedure Laterality Date  . COLON SURGERY  2008   carcinoid  . COLONOSCOPY  2003, 2014  . CYSTOSCOPY WITH DIRECT VISION INTERNAL URETHROTOMY N/A 04/28/2015   Procedure: CYSTOSCOPY WITH DIRECT VISION INTERNAL URETHROTOMY WITH HOLMIUM LASER ;  Surgeon: Royston Cowper, MD;  Location: [facility] ORS;  Service: Urology;  Laterality: N/A;  . FOOT SURGERY Right 2013   HPI:  Pt  is a 76 y.o. male with a known history of recent CVA, multiple CVAs overall per wife, CKD stage III, diverticulosis, kidney stone, hypertension, hyperlipidemia and diabetes. The patient was sent from skilled facility to the ED due to above chief complaints. He complains of intermittent nausea, vomiting and watery diarrhea for 3 days. He came to the ED yesterday and get CAT scan of the abdomen, which didn't show any obstruction. Since he continuously has nausea, vomiting and diarrhea, he came to the ED again. He was found leukocytosis,  dehydration and hyponatremia. Pt indicated he "sometimes almosts strangles" when drinking water; he stated he does "ok" when eating his meals overall. Family endorsed intermittent s/s of aspiration but denied anything consistent in nature.    Assessment / Plan / Recommendation Clinical Impression  Pt appears at reduced risk for aspiration when following general aspiration precautions incuding small, single sips slowly - pt also need support and monitoring for upright position when eating/drinking. Pt consumed po trials of thin liquids via cup then straw following aspiration precautions and given verbal cues by SLP. Clear vocal quality noted post trials; no decline in respiratory status; no coughing or throat clearing noted w/ po trials. Pt appeared to adequately tolerate trials of purees and softened solids w/ no overt s/s of aspiration noted; oral phase c/b min increased oral phase time for bolus management w/ increased textured trials. Given time, pt was able to sufficiently masticate/mash foods and clear orally. Pt required assistance w/ feeding d/t UE weakness to feed self; unsure of overall vision as well. Of note, pt's Cognitve-Linguistic status appears declined; intermittent, min delay in verbal responses. If a change from his baseline, then would recommend a f/u assessment at SNF/Rehab to address any cognitive-linguistic skills for ADLs. Recommend a dysphagia level 2 w/ thin liquids; aspiration precautions including liquids via Cup as indicated; Pills in puree w/ NSG.  SLP Visit Diagnosis: Dysphagia, oropharyngeal phase (R13.12)    Aspiration Risk   (reduced-mild aspiration risk)    Diet  Recommendation  Dysphagia level 2 w/ thin liquids; aspiration precautions; assistance and monitoring at all meals  Medication Administration: Whole meds with puree    Other  Recommendations Recommended Consults:  (Dietician) Oral Care Recommendations: Oral care BID;Staff/trained caregiver to provide oral care    Follow up Recommendations Skilled Nursing facility      Frequency and Duration  TBD         Prognosis Prognosis for Safe Diet Advancement: Good Barriers to Reach Goals: Cognitive deficits      Swallow Study   General Date of Onset: 10/14/16 HPI: Pt  is a 76 y.o. male with a known history of recent CVA, multiple CVAs overall per wife, CKD stage III, diverticulosis, kidney stone, hypertension, hyperlipidemia and diabetes. The patient was sent from skilled facility to the ED due to above chief complaints. He complains of intermittent nausea, vomiting and watery diarrhea for 3 days. He came to the ED yesterday and get CAT scan of the abdomen, which didn't show any obstruction. Since he continuously has nausea, vomiting and diarrhea, he came to the ED again. He was found leukocytosis, dehydration and hyponatremia. Pt indicated he "sometimes almosts strangles" when drinking water; he stated he does "ok" when eating his meals overall. Family endorsed intermittent s/s of aspiration but denied anything consistent in nature.  Type of Study: Bedside Swallow Evaluation Previous Swallow Assessment: none recent Diet Prior to this Study: Dysphagia 1 (puree);Thin liquids (currently) Temperature Spikes Noted: No (wbc elevated) Respiratory Status: Room air History of Recent Intubation: No Behavior/Cognition: Alert;Cooperative;Pleasant mood;Distractible;Requires cueing Oral Cavity Assessment: Within Functional Limits Oral Care Completed by SLP: Recent completion by staff Oral Cavity - Dentition: Missing dentition;Poor condition Vision: Impaired for self-feeding (min(deepth perception?)) Self-Feeding Abilities: Able to feed self;Needs assist;Needs set up Patient Positioning: Upright in bed (better support and positioning needed; leans to R) Baseline Vocal Quality: Low vocal intensity (mumbled speech) Volitional Cough: Strong Volitional Swallow: Able to elicit    Oral/Motor/Sensory Function Overall  Oral Motor/Sensory Function: Within functional limits   Ice Chips Ice chips: Within functional limits Presentation: Spoon (fed; 2 trials)   Thin Liquid Thin Liquid: Within functional limits Presentation: Cup;Self Fed;Straw (6 trials; 4 trials)    Nectar Thick Nectar Thick Liquid: Not tested   Honey Thick Honey Thick Liquid: Not tested   Puree Puree: Within functional limits Presentation: Spoon;Self Fed (assisted; 5 trials)   Solid   GO   Solid: Impaired Presentation: Spoon (fed; 5 trials) Oral Phase Impairments:  (munching pattern - min. ) Oral Phase Functional Implications: Impaired mastication (min increased time) Pharyngeal Phase Impairments:  (none)    Functional Assessment Tool Used: clinical judgement Functional Limitations: Swallowing Swallow Current Status ([id number]): At least 1 percent but less than 20 percent impaired, limited or restricted Swallow Goal Status [id number]): At least 1 percent but less than 20 percent impaired, limited or restricted Swallow Discharge Status [phone]): At least 1 percent but less than 20 percent impaired, limited or restricted    Orinda Kenner, MS, CCC-SLP Watson,Katherine 10/17/2016,3:49 PM

## 2016-10-19 NOTE — Telephone Encounter (Signed)
Patient discharged to Skill nursing at [facility]

## 2016-10-21 ENCOUNTER — Ambulatory Visit: Payer: Medicare Other | Admitting: Family Medicine

## 2016-11-08 ENCOUNTER — Emergency Department: Payer: Medicare Other

## 2016-11-08 ENCOUNTER — Encounter: Payer: Self-pay | Admitting: Emergency Medicine

## 2016-11-08 ENCOUNTER — Emergency Department
Admission: EM | Admit: 2016-11-08 | Discharge: 2016-11-08 | Disposition: A | Discharge status: Home / Self Care | Payer: Medicare Other | Attending: Emergency Medicine | Admitting: Emergency Medicine

## 2016-11-08 DIAGNOSIS — E1122 Type 2 diabetes mellitus with diabetic chronic kidney disease: Secondary | ICD-10-CM | POA: Diagnosis not present

## 2016-11-08 DIAGNOSIS — R079 Chest pain, unspecified: Secondary | ICD-10-CM | POA: Diagnosis present

## 2016-11-08 DIAGNOSIS — R1084 Generalized abdominal pain: Secondary | ICD-10-CM | POA: Diagnosis not present

## 2016-11-08 DIAGNOSIS — I129 Hypertensive chronic kidney disease with stage 1 through stage 4 chronic kidney disease, or unspecified chronic kidney disease: Secondary | ICD-10-CM | POA: Insufficient documentation

## 2016-11-08 DIAGNOSIS — R1032 Left lower quadrant pain: Secondary | ICD-10-CM | POA: Diagnosis not present

## 2016-11-08 DIAGNOSIS — Z87891 Personal history of nicotine dependence: Secondary | ICD-10-CM | POA: Insufficient documentation

## 2016-11-08 DIAGNOSIS — R112 Nausea with vomiting, unspecified: Secondary | ICD-10-CM | POA: Insufficient documentation

## 2016-11-08 DIAGNOSIS — R110 Nausea: Secondary | ICD-10-CM

## 2016-11-08 DIAGNOSIS — N183 Chronic kidney disease, stage 3 (moderate): Secondary | ICD-10-CM | POA: Insufficient documentation

## 2016-11-08 DIAGNOSIS — Z79899 Other long term (current) drug therapy: Secondary | ICD-10-CM | POA: Insufficient documentation

## 2016-11-08 DIAGNOSIS — Z7982 Long term (current) use of aspirin: Secondary | ICD-10-CM | POA: Insufficient documentation

## 2016-11-08 LAB — HEPATIC FUNCTION PANEL
ALBUMIN: 3.6 g/dL (ref 3.5–5.0)
ALK PHOS: 57 U/L (ref 38–126)
ALT: 23 U/L (ref 17–63)
AST: 26 U/L (ref 15–41)
BILIRUBIN DIRECT: 0.2 mg/dL (ref 0.1–0.5)
BILIRUBIN TOTAL: 0.8 mg/dL (ref 0.3–1.2)
Indirect Bilirubin: 0.6 mg/dL (ref 0.3–0.9)
Total Protein: 7.4 g/dL (ref 6.5–8.1)

## 2016-11-08 LAB — BASIC METABOLIC PANEL
Anion gap: 7 (ref 5–15)
BUN: 31 mg/dL — ABNORMAL HIGH (ref 6–20)
CALCIUM: 9.1 mg/dL (ref 8.9–10.3)
CO2: 26 mmol/L (ref 22–32)
CREATININE: 1.46 mg/dL — AB (ref 0.61–1.24)
Chloride: 102 mmol/L (ref 101–111)
GFR calc non Af Amer: 45 mL/min — ABNORMAL LOW (ref >60)
GFR, EST AFRICAN AMERICAN: 52 mL/min — AB (ref >60)
Glucose, Bld: 197 mg/dL — ABNORMAL HIGH (ref 65–99)
Potassium: 4.3 mmol/L (ref 3.5–5.1)
SODIUM: 135 mmol/L (ref 135–145)

## 2016-11-08 LAB — CBC
HCT: 30.1 % — ABNORMAL LOW (ref 40.0–52.0)
Hemoglobin: 10.1 g/dL — ABNORMAL LOW (ref 13.0–18.0)
MCH: 30.4 pg (ref 26.0–34.0)
MCHC: 33.6 g/dL (ref 32.0–36.0)
MCV: 90.4 fL (ref 80.0–100.0)
PLATELETS: 259 10*3/uL (ref 150–440)
RBC: 3.33 MIL/uL — AB (ref 4.40–5.90)
RDW: 13.9 % (ref 11.5–14.5)
WBC: 10.4 10*3/uL (ref 3.8–10.6)

## 2016-11-08 LAB — TROPONIN I: Troponin I: <0.03 ng/mL (ref <0.03)

## 2016-11-08 LAB — LIPASE, BLOOD: Lipase: 18 U/L (ref 11–51)

## 2016-11-08 MED ORDER — IOPAMIDOL (ISOVUE-300) INJECTION 61%
30.0000 mL | Freq: Once | INTRAVENOUS | Status: AC
  Administered 2016-11-08: 30 mL via ORAL

## 2016-11-08 MED ORDER — AMLODIPINE BESYLATE 5 MG PO TABS
5.0000 mg | ORAL_TABLET | Freq: Once | ORAL | Status: AC
  Administered 2016-11-08: 5 mg via ORAL
  Filled 2016-11-08: qty 1

## 2016-11-08 MED ORDER — IOPAMIDOL (ISOVUE-300) INJECTION 61%
100.0000 mL | Freq: Once | INTRAVENOUS | Status: AC | PRN
  Administered 2016-11-08: 100 mL via INTRAVENOUS

## 2016-11-08 MED ORDER — OXYCODONE-ACETAMINOPHEN 5-325 MG PO TABS: 1.0000 | ORAL_TABLET | Freq: Four times a day (QID) | ORAL | 0 refills | Status: DC | PRN

## 2016-11-08 MED ORDER — ONDANSETRON HCL 4 MG/2ML IJ SOLN
4.0000 mg | Freq: Once | INTRAMUSCULAR | Status: AC
  Administered 2016-11-08: 4 mg via INTRAVENOUS
  Filled 2016-11-08: qty 2

## 2016-11-08 MED ORDER — CARVEDILOL 25 MG PO TABS
25.0000 mg | ORAL_TABLET | Freq: Two times a day (BID) | ORAL | Status: DC
  Administered 2016-11-08: 25 mg via ORAL
  Filled 2016-11-08: qty 1

## 2016-11-08 MED ORDER — HYDRALAZINE HCL 50 MG PO TABS
25.0000 mg | ORAL_TABLET | Freq: Once | ORAL | Status: AC
  Administered 2016-11-08: 25 mg via ORAL
  Filled 2016-11-08: qty 2

## 2016-11-08 NOTE — ED Provider Notes (Signed)
[facility] Emergency Department Provider Note   ____________________________________________   First MD Initiated Contact with Patient 11/08/16 [phone]     (approximate)  I have reviewed the triage vital signs and the nursing notes.   HISTORY  Chief Complaint Chest Pain    HPI Jacob Buck is a 76 y.o. male who comes into the hospital today from his nursing home. According to EMS they were called out for chest pain radiating down both of his arms. EMS gave the patient 325 mg of aspirin. According to the patient he was having vomiting and abdominal pain. The patient reports currently that she is just tired. His wife states that they called him saying that he was complaining of pain in his chest and abdomen and that his right arm was hurting. He reports it started after he ate dinner yesterday which was around 5:30 or 545. He denies any pain at this time. He just states that he is tired and weak. He was discussing with his wife that he was feeling bad when he had visitors but he felt better after they left. The patient does not recall receiving any medication for these symptoms. Again he states that he does not have any pain at this time and he is just tired. He is not feeling short of breath he is spitting into a bag. The patient is here for evaluation.   Past Medical History:  Diagnosis Date  . Arthritis 2008  . Cancer [facility]) 1991   Colon; right kidney ?  . Cardiomyopathy ([place])    a. ? isch vs nicm;  b. 05/2015 Echo: EF 35-40%, mild MR, mildly dil LA, poor acoustic windows.  . CKD (chronic kidney disease), stage III   . Diverticulosis   . DM type 2 (diabetes mellitus, type 2) ([place]) 2008  . Hyperlipidemia   . Hypertension   . Kidney stone   . Stroke [facility])    a. 05/2015 MRI: acute to early subacute R thalamic infarct, chronic L thalamic infarct, late subacute to chronic right occipital infarct;  b. 05/2015 Carotid U/S: RICA 3-14%H, LICA 7-02%O, patent  vertebrals;  b. 06/2015 Event Monitor: RSR, rare PVC's.    Patient Active Problem List   Diagnosis Date Noted  . Nausea vomiting and diarrhea 10/14/2016  . Right wrist pain 10/14/2016  . Acute gastroenteritis 10/14/2016  . Aortic atherosclerosis ([place]) 04/27/2016  . Coronary artery disease involving native heart without angina pectoris 04/27/2016  . Mild obstructive sleep apnea 08/02/2015  . Stroke ([place]) 07/21/2015  . Morbid obesity ([place]) 07/21/2015  . Cardiomyopathy, ischemic 06/12/2015  . Anemia 05/31/2015  . Essential hypertension 05/31/2015  . Hyperlipidemia 05/31/2015  . Non-compliant behavior 12/09/2014  . Type 2 diabetes, uncontrolled, with renal manifestation ([place]) 06/05/2014  . CKD stage 3 due to type 2 diabetes mellitus ([place]) 06/05/2014  . Diabetic retinopathy ([place]) 06/05/2014  . Diabetic neuropathy ([place]) 06/05/2014  . Status post amputation of lesser toe of right foot ([place]) 06/05/2014  . History of colon cancer 06/05/2014    Past Surgical History:  Procedure Laterality Date  . COLON SURGERY  2008   carcinoid  . COLONOSCOPY  2003, 2014  . CYSTOSCOPY WITH DIRECT VISION INTERNAL URETHROTOMY N/A 04/28/2015   Procedure: CYSTOSCOPY WITH DIRECT VISION INTERNAL URETHROTOMY WITH HOLMIUM LASER ;  Surgeon: Royston Cowper, MD;  Location: [facility] ORS;  Service: Urology;  Laterality: N/A;  . FOOT SURGERY Right 2013    Prior to Admission medications   Medication Sig  Start Date End Date Taking? Authorizing Provider  acetaminophen (TYLENOL) 650 MG CR tablet Take 650 mg by mouth every 6 (six) hours as needed for pain.   Yes [provider]  amLODipine (NORVASC) 5 MG tablet Take 1 tablet (5 mg total) by mouth daily. 10/08/16  Yes Demetrios Loll, MD  aspirin 81 MG chewable tablet Chew 1 tablet (81 mg total) by mouth daily. 10/08/16  Yes Demetrios Loll, MD  carvedilol (COREG) 25 MG tablet Take 1 tablet (25 mg total) by mouth 2 (two) times daily with a meal. 04/27/16  Yes Gollan, Kathlene November,  MD  clopidogrel (PLAVIX) 75 MG tablet Take 1 tablet (75 mg total) by mouth daily. 10/08/16  Yes Demetrios Loll, MD  diclofenac sodium (VOLTAREN) 1 % GEL Apply 2 g topically 4 (four) times daily. For right wrist   Yes [provider]  hydrALAZINE (APRESOLINE) 25 MG tablet Take 1 tablet (25 mg total) by mouth every 8 (eight) hours. 10/08/16  Yes Demetrios Loll, MD  insulin detemir (LEVEMIR) 100 UNIT/ML injection Inject 10 Units into the skin 2 (two) times daily.   Yes [provider]  loperamide (IMODIUM A-D) 2 MG tablet Take 1 tablet (2 mg total) by mouth 4 (four) times daily as needed for diarrhea or loose stools. 10/13/16  Yes Eula Listen, MD  ondansetron (ZOFRAN ODT) 4 MG disintegrating tablet Take 1 tablet (4 mg total) by mouth every 8 (eight) hours as needed for nausea or vomiting. 10/13/16  Yes Eula Listen, MD  rosuvastatin (CRESTOR) 20 MG tablet Take 1 tablet (20 mg total) by mouth daily. Patient taking differently: Take 20 mg by mouth every evening.  12/29/15  Yes Thersa Salt G, DO  tamsulosin (FLOMAX) 0.4 MG CAPS capsule Take 0.4 mg by mouth daily.   Yes [provider]    Allergies Toujeo solostar [insulin glargine]  Family History  Problem Relation Age of Onset  . Heart disease Father     mild MI  . Diabetes Sister   . Alzheimer's disease Sister   . Diabetes Brother   . Alzheimer's disease Brother     Social History Social History  Substance Use Topics  . Smoking status: Former Research scientist (life sciences)  . Smokeless tobacco: Never Used  . Alcohol use No    Review of Systems  Constitutional: No fever/chills Eyes: No visual changes. ENT: No sore throat. Cardiovascular:  chest pain. Respiratory: Denies shortness of breath. Gastrointestinal:  abdominal pain.   nausea,  vomiting.  No diarrhea.  No constipation. Genitourinary: Negative for dysuria. Musculoskeletal: Negative for back pain. Skin: Negative for rash. Neurological: Negative for headaches, focal  weakness or numbness.   ____________________________________________   PHYSICAL EXAM:  VITAL SIGNS: ED Triage Vitals  Enc Vitals Group     BP 11/08/16 0430 (!) 151/84     Pulse Rate 11/08/16 0430 77     Resp --      Temp --      Temp src --      SpO2 11/08/16 0430 98 %     Weight 11/08/16 0427 228 lb 9.6 oz (103.7 kg)     Height --      Head Circumference --      Peak Flow --      Pain Score --      Pain Loc --      Pain Edu? --      Excl. in [place]? --     Constitutional: Alert and oriented. Well appearing and in  Mild distress. Eyes: Conjunctivae are normal. PERRL. EOMI. Head: Atraumatic. Nose: No congestion/rhinnorhea. Mouth/Throat: Mucous membranes are moist.  Oropharynx non-erythematous. Cardiovascular: Normal rate, regular rhythm. Grossly normal heart sounds.  Good peripheral circulation. Respiratory: Normal respiratory effort.  No retractions. Lungs CTAB. Gastrointestinal: Soft with some left lower quadrant tenderness to palpation. No distention. Positive bowel sounds Musculoskeletal: No lower extremity tenderness nor edema.   Neurologic:  Normal speech and language.  Skin:  Skin is warm, dry and intact.  Psychiatric: Mood and affect are normal.   ____________________________________________   LABS (all labs ordered are listed, but only abnormal results are displayed)  Labs Reviewed  BASIC METABOLIC PANEL - Abnormal; Notable for the following:       Result Value   Glucose, Bld 197 (*)    BUN 31 (*)    Creatinine, Ser 1.46 (*)    GFR calc non Af Amer 45 (*)    GFR calc Af Amer 52 (*)    All other components within normal limits  CBC - Abnormal; Notable for the following:    RBC 3.33 (*)    Hemoglobin 10.1 (*)    HCT 30.1 (*)    All other components within normal limits  TROPONIN I  HEPATIC FUNCTION PANEL  LIPASE, BLOOD  TROPONIN I   ____________________________________________  EKG  ED ECG REPORT I, Loney Hering, the attending physician,  personally viewed and interpreted this ECG.   Date: 11/08/2016  EKG Time: 448  Rate: 76  Rhythm: normal sinus rhythm  Axis: normal  Intervals:none  ST&T Change: none  ____________________________________________  RADIOLOGY  CXR CT abd and pelvis ____________________________________________   PROCEDURES  Procedure(s) performed: None  Procedures  Critical Care performed: No  ____________________________________________   INITIAL IMPRESSION / ASSESSMENT AND PLAN / ED COURSE  Pertinent labs & imaging results that were available during my care of the patient were reviewed by me and considered in my medical decision making (see chart for details).  This is a 76 year old male who comes into the hospital with vomiting and abdominal pain. The patient also had some chest pain according to his nursing home. The patient's initial blood work is unremarkable. I will send him for a CT scan as he does have a history of diverticulosis. I will reassess the patient once I received his blood work and his CT scan results.  Clinical Course as of Nov 09 838  Tue Nov 08, 2016  0615 No active disease. DG Chest Portable 1 View [AW]  [id number] No acute findings in the abdomen or pelvis.  Small bilateral renal pelvic stones layering. No hydronephrosis.  Coronary artery disease, aortic atherosclerosis.  Left colonic diverticulosis. No active diverticulitis.   CT Abdomen Pelvis W Contrast [AW]    Clinical Course User Index [AW] Loney Hering, MD   The patient's CT scan is unremarkable. We will repeat the patient's troponin is unremarkable he will be discharged home.  ____________________________________________   FINAL CLINICAL IMPRESSION(S) / ED DIAGNOSES  Final diagnoses:  Chest pain, unspecified type  Generalized abdominal pain  Nausea      NEW MEDICATIONS STARTED DURING THIS VISIT:  New Prescriptions   No medications on file     Note:  This document was prepared  using Dragon voice recognition software and may include unintentional dictation errors.    Loney Hering, MD 11/08/16 [phone]

## 2016-11-08 NOTE — ED Notes (Signed)
Called EMS for transport to [place] [facility]

## 2016-11-08 NOTE — ED Triage Notes (Addendum)
Pt arrived to ED by EMS from [facility] with c/o chest pain that started at 1700 on Monday and continued to worsen throughout night with pain and numbness radiating into right and left arm, 325 Aspirin given in route. Pt denies pain upon arrival, poor historian per pt, facility and EMS. Family called to bedside from [place].

## 2016-11-21 ENCOUNTER — Telehealth: Payer: Self-pay | Admitting: Family Medicine

## 2016-11-21 NOTE — Telephone Encounter (Signed)
YBNL [phone] called from [facility] regarding OT requesting one time a week for the first and 2 times a week for five weeks and then 1 time a week for very last week. VM can be left. Thank you!

## 2016-11-22 NOTE — Telephone Encounter (Signed)
Left a detailed message with orders, thanks

## 2016-11-29 NOTE — Telephone Encounter (Signed)
Cherlynn Polo Therapist from [facility]  lvm requesting a verbal order for this pt to continue his speech therapy for 4 more weeks one time daily. Please call [phone] to give verbal.

## 2016-11-29 NOTE — Telephone Encounter (Signed)
Per note, left a detailed message with verbal orders, thanks

## 2016-12-01 ENCOUNTER — Telehealth: Payer: Self-pay | Admitting: *Deleted

## 2016-12-01 NOTE — Telephone Encounter (Signed)
Sherry from [facility] reported pt having a 4 x 4 cm  Black area wound on left hill. Judeen Hammans suspect  this is a deep tissue wound and would like to have verbal orders to wrap the area in a foam bandage and gauze.  Contact Judeen Hammans [phone]

## 2016-12-01 NOTE — Telephone Encounter (Signed)
You okay with her completing wound care?

## 2016-12-01 NOTE — Telephone Encounter (Signed)
Yes

## 2016-12-02 NOTE — Telephone Encounter (Signed)
Spoke with Sheery from [facility], gave verbal order to complete wound care. thanks

## 2016-12-08 ENCOUNTER — Ambulatory Visit (INDEPENDENT_AMBULATORY_CARE_PROVIDER_SITE_OTHER): Payer: Medicare Other | Admitting: Family Medicine

## 2016-12-08 ENCOUNTER — Encounter: Payer: Self-pay | Admitting: Family Medicine

## 2016-12-08 VITALS — BP 152/84 | HR 68 | Temp 98.2°F | Resp 12

## 2016-12-08 DIAGNOSIS — E1142 Type 2 diabetes mellitus with diabetic polyneuropathy: Secondary | ICD-10-CM | POA: Diagnosis not present

## 2016-12-08 DIAGNOSIS — E118 Type 2 diabetes mellitus with unspecified complications: Secondary | ICD-10-CM

## 2016-12-08 DIAGNOSIS — N183 Chronic kidney disease, stage 3 unspecified: Secondary | ICD-10-CM

## 2016-12-08 DIAGNOSIS — E785 Hyperlipidemia, unspecified: Secondary | ICD-10-CM

## 2016-12-08 DIAGNOSIS — Z794 Long term (current) use of insulin: Secondary | ICD-10-CM | POA: Diagnosis not present

## 2016-12-08 DIAGNOSIS — Z1159 Encounter for screening for other viral diseases: Secondary | ICD-10-CM

## 2016-12-08 DIAGNOSIS — D649 Anemia, unspecified: Secondary | ICD-10-CM | POA: Diagnosis not present

## 2016-12-08 DIAGNOSIS — I10 Essential (primary) hypertension: Secondary | ICD-10-CM | POA: Diagnosis not present

## 2016-12-08 LAB — CBC
HEMATOCRIT: 30.9 % — AB (ref 39.0–52.0)
Hemoglobin: 10.2 g/dL — ABNORMAL LOW (ref 13.0–17.0)
MCHC: 33.1 g/dL (ref 30.0–36.0)
MCV: 92 fl (ref 78.0–100.0)
Platelets: 214 10*3/uL (ref 150.0–400.0)
RBC: 3.36 Mil/uL — ABNORMAL LOW (ref 4.22–5.81)
RDW: 14.4 % (ref 11.5–15.5)
WBC: 9.2 10*3/uL (ref 4.0–10.5)

## 2016-12-08 LAB — COMPREHENSIVE METABOLIC PANEL
ALT: 24 U/L (ref 0–53)
AST: 23 U/L (ref 0–37)
Albumin: 3.9 g/dL (ref 3.5–5.2)
Alkaline Phosphatase: 58 U/L (ref 39–117)
BUN: 40 mg/dL — AB (ref 6–23)
CHLORIDE: 102 meq/L (ref 96–112)
CO2: 27 meq/L (ref 19–32)
Calcium: 9.1 mg/dL (ref 8.4–10.5)
Creatinine, Ser: 2.22 mg/dL — ABNORMAL HIGH (ref 0.40–1.50)
GFR: 37.24 mL/min — ABNORMAL LOW (ref >60.00)
GLUCOSE: 149 mg/dL — AB (ref 70–99)
POTASSIUM: 4.2 meq/L (ref 3.5–5.1)
SODIUM: 137 meq/L (ref 135–145)
TOTAL PROTEIN: 6.9 g/dL (ref 6.0–8.3)
Total Bilirubin: 0.7 mg/dL (ref 0.2–1.2)

## 2016-12-08 LAB — HEPATITIS C ANTIBODY: HCV AB: NEGATIVE

## 2016-12-08 MED ORDER — GABAPENTIN 300 MG PO CAPS: 300.0000 mg | ORAL_CAPSULE | Freq: Every day | ORAL | 1 refills | Status: AC

## 2016-12-08 MED ORDER — DICLOFENAC SODIUM 1 % TD GEL: 2.0000 g | Freq: Four times a day (QID) | TRANSDERMAL | 1 refills | Status: AC

## 2016-12-08 NOTE — Assessment & Plan Note (Signed)
Goal < 8. Stable at this time. Family to log sugars. Continue Levemir 22 units BID.

## 2016-12-08 NOTE — Assessment & Plan Note (Signed)
Starting Gabapentin.

## 2016-12-08 NOTE — Progress Notes (Signed)
Subjective:  Patient ID: Jacob Buck, male    DOB: 03-22-1941  Age: 76 y.o. MRN: [id number]  CC: Follow up  HPI:  76 year old male with an extensive PMH including Stroke, HTN, CAD, DM-2, HLD, CKD presents for follow up.  DM-2  Fair control given age and comorbidities - A1C 7.8 (10/07/16).  Currently on Levemir 22 units BID.  Having significant pain from diabetic neuropathy. Would like to stop Gabapentin.  HTN  BP elevated today. Currently on Norvasc, Coreg, Hydralazine.  Family asking about restarting Lisinopril that was previously discontinued due to AKI/dehydration.  Hyperlipidemia  At goal  On Crestor.   Social Hx   Social History   Social History  . Marital status: Married    Spouse name: N/A  . Number of children: N/A  . Years of education: N/A   Social History Main Topics  . Smoking status: Former Research scientist (life sciences)  . Smokeless tobacco: Never Used  . Alcohol use No  . Drug use: No  . Sexual activity: No   Other Topics Concern  . None   Social History Narrative   Retired in 2006   Lives at home with wife   2 children daughters   Enjoys- fishing    Left handed    Pets- 2 inside dogs    Review of Systems  Constitutional: Negative for fever.  Skin:       Dark area on heel.  Neurological: Positive for numbness.   Objective:  BP (!) 152/84 (BP Location: Left Arm, Patient Position: Sitting, Cuff Size: Normal)   Pulse 68   Temp 98.2 F (36.8 C) (Oral)   Resp 12   SpO2 98%   BP/Weight 12/08/2016 11/08/2016 9/62/9528  Systolic BP 413 244 010  Diastolic BP 84 94 67  Wt. (Lbs) - 228.6 -  BMI - 29.35 -    Physical Exam  Constitutional: He appears well-developed. No distress.  Cardiovascular: Normal rate and regular rhythm.   Pulmonary/Chest: Effort normal and breath sounds normal. He has no wheezes. He has no rales.  Neurological: He is alert.  Skin:  R heel - dark area noted underneath the skin. No open wound.  Psychiatric: He has a normal mood and  affect.  Vitals reviewed.   Lab Results  Component Value Date   WBC 9.2 12/08/2016   HGB 10.2 (L) 12/08/2016   HCT 30.9 (L) 12/08/2016   PLT 214.0 12/08/2016   GLUCOSE 149 (H) 12/08/2016   CHOL 109 10/07/2016   TRIG 191 (H) 10/07/2016   HDL 31 (L) 10/07/2016   LDLDIRECT 66.0 12/29/2015   LDLCALC 40 10/07/2016   ALT 24 12/08/2016   AST 23 12/08/2016   NA 137 12/08/2016   K 4.2 12/08/2016   CL 102 12/08/2016   CREATININE 2.22 (H) 12/08/2016   BUN 40 (H) 12/08/2016   CO2 27 12/08/2016   TSH 2.044 05/31/2015   INR 0.98 10/06/2016   HGBA1C 7.8 (H) 10/07/2016   MICROALBUR 223.2 (H) 12/29/2015    Assessment & Plan:   Problem List Items Addressed This Visit      Cardiovascular and Mediastinum   Essential hypertension    BP elevated today. Labs today revealed rising creatinine. Holding off on adding ACEI. Needs to see nephrology given worsening creatinine and ongoing anemia.         Endocrine   DM (diabetes mellitus), type 2 with complications ([place]) - Primary    Goal < 8. Stable at this time. Family to log  sugars. Continue Levemir 22 units BID.      Relevant Orders   Ambulatory referral to Ophthalmology   Diabetic neuropathy ([place])    Starting Gabapentin.      Relevant Orders   DME Wheelchair manual     Other   Hyperlipidemia    At goal on Crestor. Continue.       Anemia   Relevant Orders   CBC (Completed)   Iron, TIBC and Ferritin Panel    Other Visit Diagnoses    CKD (chronic kidney disease) stage 3, GFR 30-59 ml/min       Relevant Orders   Comprehensive metabolic panel (Completed)   Ambulatory referral to Nephrology   Need for hepatitis C screening test       Relevant Orders   Hepatitis C Antibody      Meds ordered this encounter  Medications  . diclofenac sodium (VOLTAREN) 1 % GEL    Sig: Apply 2 g topically 4 (four) times daily. For right wrist    Dispense:  100 g    Refill:  1  . gabapentin (NEURONTIN) 300 MG capsule    Sig: Take 1  capsule (300 mg total) by mouth at bedtime.    Dispense:  90 capsule    Refill:  1   Follow-up: 1 month.  [place]

## 2016-12-08 NOTE — Assessment & Plan Note (Signed)
BP elevated today. Labs today revealed rising creatinine. Holding off on adding ACEI. Needs to see nephrology given worsening creatinine and ongoing anemia.

## 2016-12-08 NOTE — Patient Instructions (Signed)
Gabapentin at night.  Log his blood sugars.   We will call with the referral to the eye doctor.  I will take care of wheelchair.  Follow up in 1 month.  Take care  Dr. Lacinda Axon

## 2016-12-08 NOTE — Assessment & Plan Note (Signed)
At goal on Crestor.  Continue. 

## 2016-12-09 ENCOUNTER — Telehealth: Payer: Self-pay | Admitting: *Deleted

## 2016-12-09 LAB — IRON,TIBC AND FERRITIN PANEL
%SAT: 34 % (ref 15–60)
FERRITIN: 547 ng/mL — AB (ref 20–380)
IRON: 81 ug/dL (ref 50–180)
TIBC: 239 ug/dL — ABNORMAL LOW (ref 250–425)

## 2016-12-09 NOTE — Telephone Encounter (Signed)
Order faxed to [facility] for wheelchair.

## 2016-12-09 NOTE — Telephone Encounter (Signed)
Opened in error

## 2016-12-13 ENCOUNTER — Encounter: Payer: Self-pay | Admitting: Family Medicine

## 2016-12-13 ENCOUNTER — Telehealth: Payer: Self-pay | Admitting: *Deleted

## 2016-12-13 MED ORDER — ROSUVASTATIN CALCIUM 20 MG PO TABS: 20.0000 mg | ORAL_TABLET | Freq: Every evening | ORAL | 3 refills | Status: AC

## 2016-12-13 MED ORDER — INSULIN DETEMIR 100 UNIT/ML ~~LOC~~ SOLN: 10.0000 [IU] | Freq: Two times a day (BID) | SUBCUTANEOUS | 3 refills | Status: DC

## 2016-12-13 MED ORDER — CLOPIDOGREL BISULFATE 75 MG PO TABS: 75.0000 mg | ORAL_TABLET | Freq: Every day | ORAL | 2 refills | Status: AC

## 2016-12-13 MED ORDER — TAMSULOSIN HCL 0.4 MG PO CAPS: 0.4000 mg | ORAL_CAPSULE | Freq: Every day | ORAL | 3 refills | Status: AC

## 2016-12-13 NOTE — Telephone Encounter (Signed)
[facility] has requested a medication refills for clopidogrel, lisinopril , rosuvastatin, tamsulosin and insulin syringes.  Patient has changed pharmacies.  Contact [phone] Fax [phone]

## 2016-12-13 NOTE — Telephone Encounter (Signed)
I have pended the medications except for the lisinopril as he is not currently on that per our records.  Please advise as he has changed pharmacies. thanks

## 2016-12-14 ENCOUNTER — Other Ambulatory Visit: Payer: Self-pay | Admitting: Family Medicine

## 2016-12-14 ENCOUNTER — Telehealth: Payer: Self-pay

## 2016-12-14 NOTE — Telephone Encounter (Signed)
Pt daughter called and wanted to clarify pt's insulin detemir (LEVEMIR) 100 UNIT/ML injection. It looks like the rx was sent in for 10 units but the notes states 22 units. Please advise, thank you!  Call Ashley @ [phone]

## 2016-12-14 NOTE — Telephone Encounter (Signed)
Rodena Piety called back and the information was given. Thank you!

## 2016-12-14 NOTE — Telephone Encounter (Signed)
Prior authorization submitted to cover my meds.

## 2016-12-14 NOTE — Telephone Encounter (Signed)
Patient should be on 22units BID, attempted to call anita, line busy will try again.

## 2016-12-16 ENCOUNTER — Telehealth: Payer: Self-pay | Admitting: Family Medicine

## 2016-12-16 NOTE — Telephone Encounter (Signed)
Pharmacy called looking for clarification on pt's hydrALAZINE (APRESOLINE) 25 MG tablet. Pt states that he takes this twice a day and the directions say 3 times. Please advise, thank you!  Pharmacy - [place], [place].

## 2016-12-18 ENCOUNTER — Telehealth: Payer: Self-pay | Admitting: Family Medicine

## 2016-12-19 NOTE — Telephone Encounter (Signed)
Patient is deceased, note closed, thanks

## 2017-01-01 NOTE — Telephone Encounter (Signed)
[place] On call physician  Patient died this weekend. Apparently had CVA about a month ago and family was expecting natural death at any time. He has a history of CAD as well.   For unclear reasons- initial nurse that EMS spoke to said physician would "not be bothered on the weekend" and that we were not available by phone. I was called by the medical examiner who was asking that we would sign the death certificate (Dr. Lacinda Axon next week). I told them this was very reasonable since no foul play was expected. I apologized to medical examiner for the initial response by our on call nurses and explained we are certainly available by phone in circumstances liek this.   Will forward to Dr. Lacinda Axon as Juluis Rainier. Changed patient's status to deceased

## 2017-01-01 DEATH — deceased

## 2018-08-06 IMAGING — CT CT HEAD W/O CM
3 series · 14 of 47 positions shown, 16 images · non-contrast
Comparison: Brain MRI 05/30/2015.  Head CT 05/30/2015.

CLINICAL DATA: Bilateral leg weakness and slurred speech for 2
days.

EXAM:
CT HEAD WITHOUT CONTRAST
TECHNIQUE: Contiguous axial images were obtained from the base of the skull
through the vertex without intravenous contrast.

[Series 2: head wo · axial · 0.47mm/px · z∈[+1525,+1660]mm · 8 of 33 slices shown, 10 images]
[im 3/33  brain]
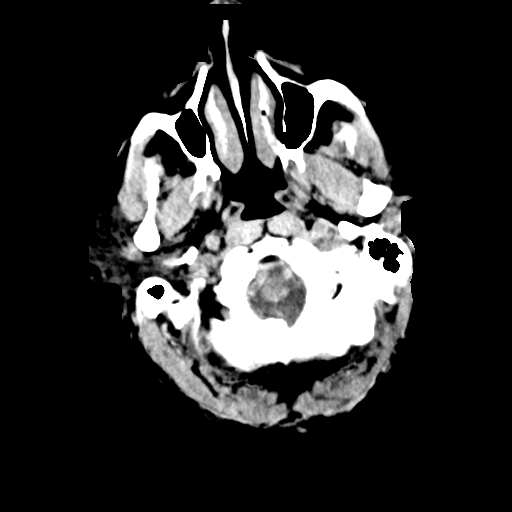
[im 3/33  bone]
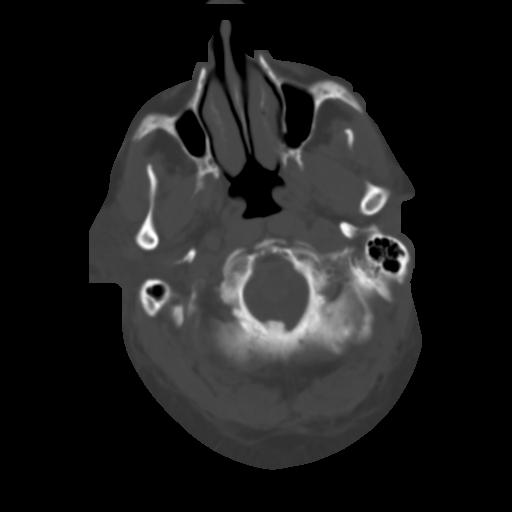
[im 7/33  brain]
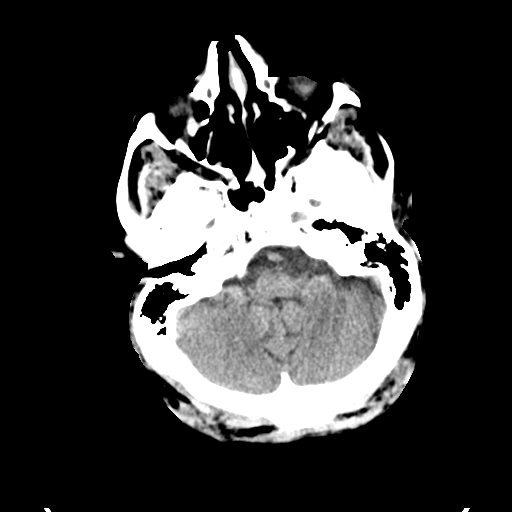
[im 10/33  brain]
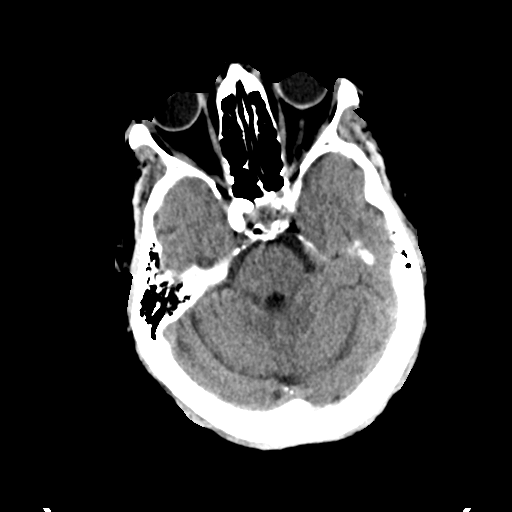
[im 15/33  brain]
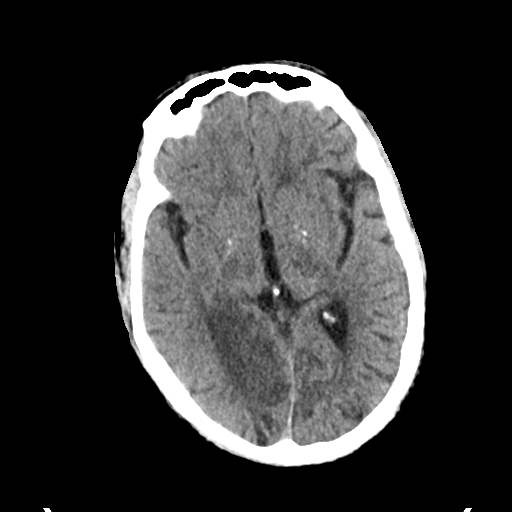
[im 18/33  brain]
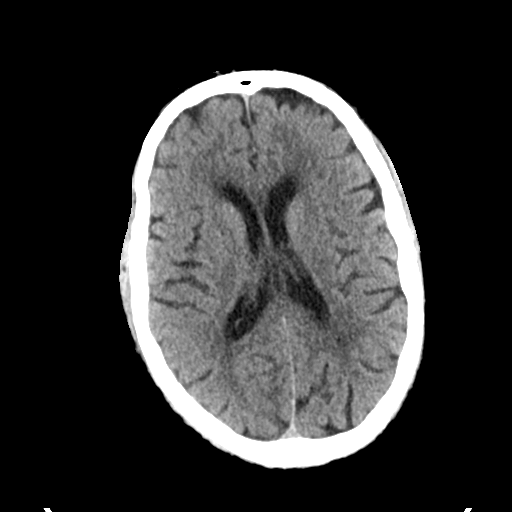
[im 18/33  bone]
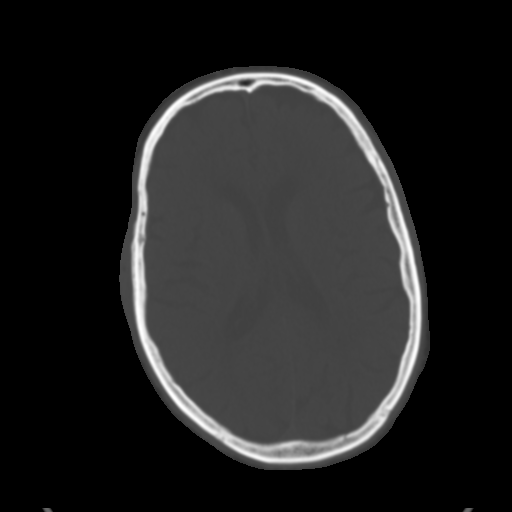
[im 23/33  brain]
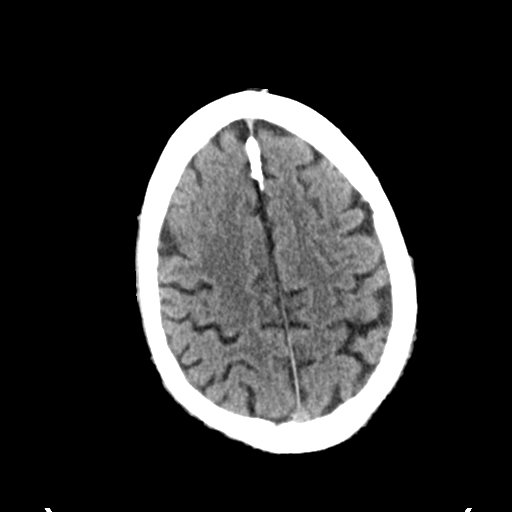
[im 26/33  brain]
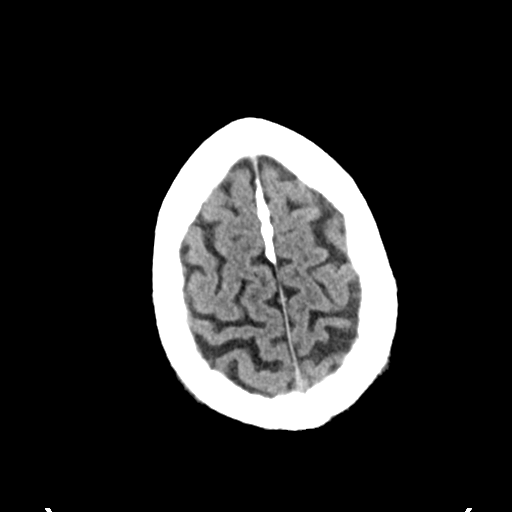
[im 30/33  brain]
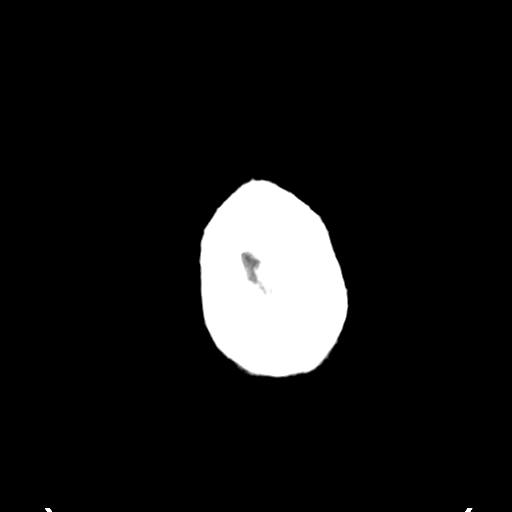

[Series 4: coronal soft tissue · coronal · 0.33mm/px · 3 of 60 slices shown]
[im 20/60  brain]
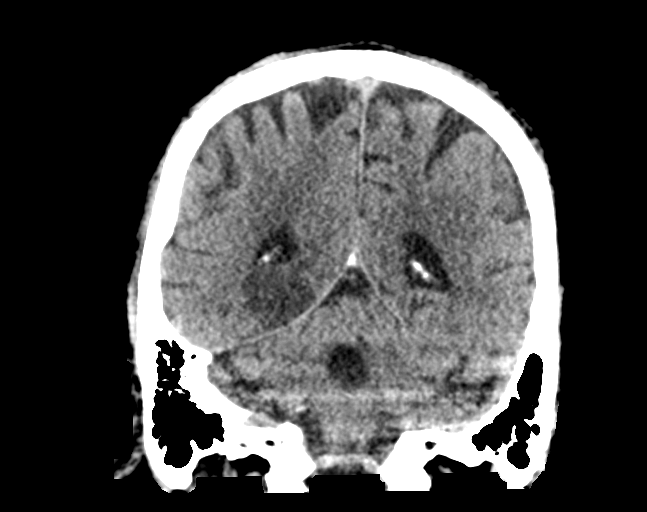
[im 27/60  brain]
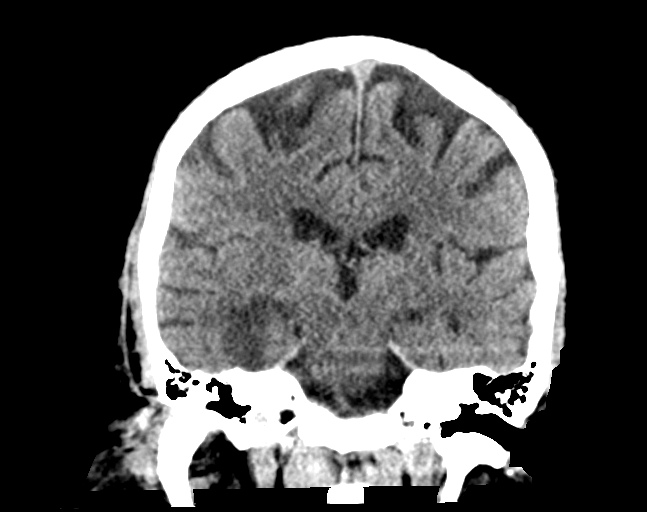
[im 33/60  brain]
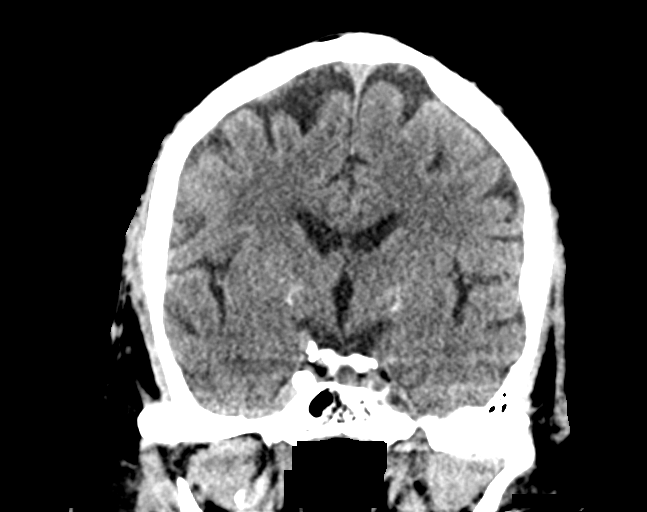

[Series 5: sagittal soft tissue · sagittal · 0.30mm/px · 3 of 51 slices shown]
[im 17/51  brain]
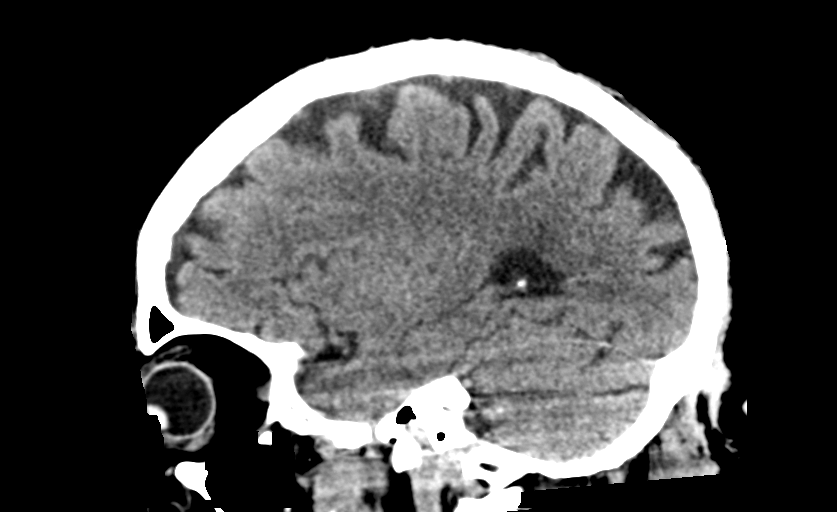
[im 26/51  brain]
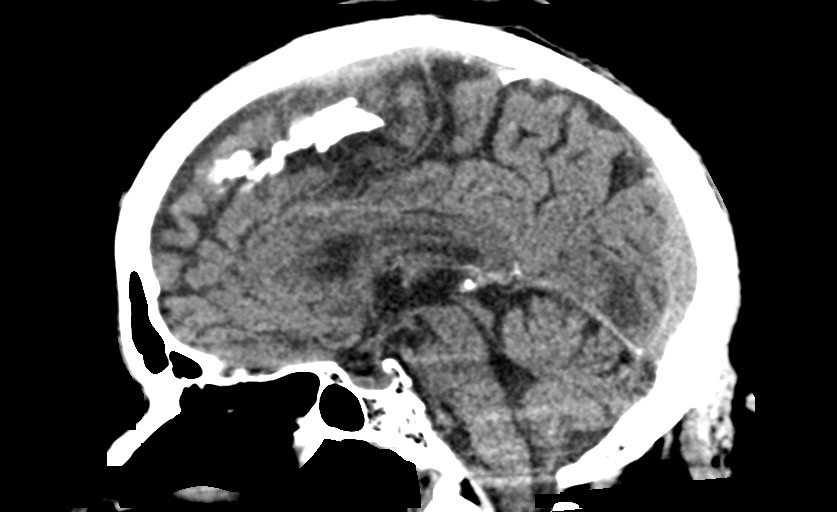
[im 34/51  brain]
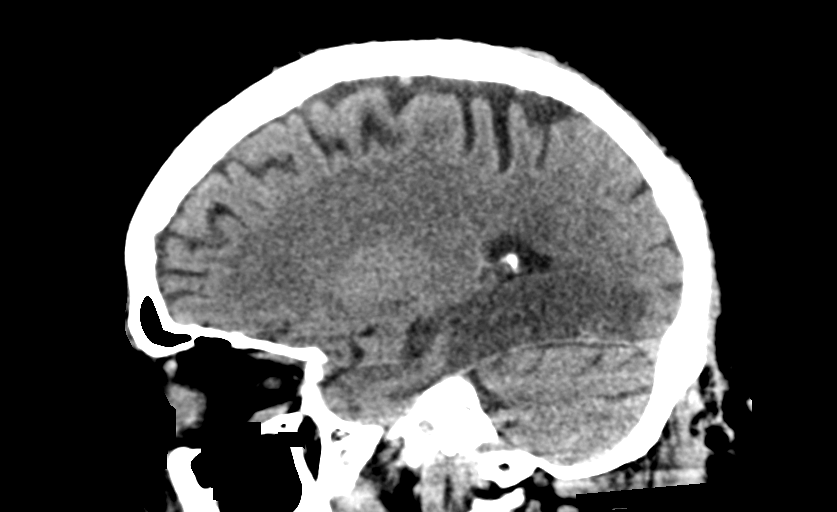

[14 of 47 positions shown; findings below may reference images not displayed]

FINDINGS: Brain: Low-attenuation in the medial right temporal lobe, parietal
lobe and occipital lobe is compatible with acute to subacute
ischemia in a right PCA territory, superimposed on posterior chronic
PCA infarct seen on previous imaging. No evidence for acute
hemorrhage. No mass-effect or mass lesion. No abnormal extra-axial
fluid collection. Diffuse loss of parenchymal volume is consistent
with atrophy. Patchy low attenuation in the deep hemispheric and
periventricular white matter is nonspecific, but likely reflects
chronic microvascular ischemic demyelination.

Vascular: Atherosclerotic calcification is visualized in the
intracranial segments of the internal carotid arteries. No dense MCA
sign. Major dural sinuses are unremarkable.

Skull: No evidence for fracture. No worrisome lytic or sclerotic
lesion.

Sinuses/Orbits: The visualized paranasal sinuses and mastoid air
cells are clear. Visualized portions of the globes and intraorbital
fat are unremarkable.

Other: None.
IMPRESSION: Acute to subacute infarct involving the right PCA distribution in
this patient with history of previous PCA territory infarct.

No evidence for substantial mass-effect or acute hemorrhage.
